# Patient Record
Sex: Female | Born: 1975 | Race: White | Hispanic: No | Marital: Married | Smoking: Former smoker
Health system: Southern US, Community
[De-identification: ages and names within clinical notes are randomized; demographics above are authoritative.]

## PROBLEM LIST (undated history)

## (undated) DIAGNOSIS — I10 Essential (primary) hypertension: Secondary | ICD-10-CM

## (undated) DIAGNOSIS — E119 Type 2 diabetes mellitus without complications: Secondary | ICD-10-CM

## (undated) DIAGNOSIS — Z8709 Personal history of other diseases of the respiratory system: Secondary | ICD-10-CM

## (undated) DIAGNOSIS — Z87442 Personal history of urinary calculi: Secondary | ICD-10-CM

## (undated) HISTORY — PX: CHOLECYSTECTOMY: SHX55

## (undated) HISTORY — PX: DILATION AND CURETTAGE OF UTERUS: SHX78

## (undated) HISTORY — PX: ANKLE FRACTURE SURGERY: SHX122

---

## 1997-11-30 ENCOUNTER — Other Ambulatory Visit: Admission: RE | Admit: 1997-11-30 | Discharge: 1997-11-30 | Payer: Self-pay | Admitting: Obstetrics and Gynecology

## 1997-12-26 ENCOUNTER — Emergency Department (HOSPITAL_COMMUNITY): Admission: EM | Admit: 1997-12-26 | Discharge: 1997-12-27 | Payer: Self-pay

## 1997-12-27 ENCOUNTER — Ambulatory Visit (HOSPITAL_COMMUNITY): Admission: RE | Admit: 1997-12-27 | Discharge: 1997-12-27 | Payer: Self-pay

## 1997-12-28 ENCOUNTER — Other Ambulatory Visit: Admission: RE | Admit: 1997-12-28 | Discharge: 1997-12-28 | Payer: Self-pay | Admitting: Obstetrics and Gynecology

## 1997-12-30 ENCOUNTER — Ambulatory Visit (HOSPITAL_COMMUNITY): Admission: RE | Admit: 1997-12-30 | Discharge: 1997-12-30 | Payer: Self-pay | Admitting: Obstetrics and Gynecology

## 1998-01-03 ENCOUNTER — Observation Stay (HOSPITAL_COMMUNITY): Admission: RE | Admit: 1998-01-03 | Discharge: 1998-01-04 | Payer: Self-pay | Admitting: Obstetrics and Gynecology

## 1998-02-07 ENCOUNTER — Other Ambulatory Visit: Admission: RE | Admit: 1998-02-07 | Discharge: 1998-02-07 | Payer: Self-pay | Admitting: Obstetrics and Gynecology

## 1998-03-30 ENCOUNTER — Ambulatory Visit (HOSPITAL_COMMUNITY): Admission: RE | Admit: 1998-03-30 | Discharge: 1998-03-30 | Payer: Self-pay | Admitting: Obstetrics and Gynecology

## 1998-06-05 ENCOUNTER — Emergency Department (HOSPITAL_COMMUNITY): Admission: EM | Admit: 1998-06-05 | Discharge: 1998-06-05 | Payer: Self-pay | Admitting: Emergency Medicine

## 1998-11-04 ENCOUNTER — Inpatient Hospital Stay (HOSPITAL_COMMUNITY): Admission: AD | Admit: 1998-11-04 | Discharge: 1998-11-04 | Payer: Self-pay | Admitting: Obstetrics and Gynecology

## 1998-12-02 ENCOUNTER — Inpatient Hospital Stay (HOSPITAL_COMMUNITY): Admission: AD | Admit: 1998-12-02 | Discharge: 1998-12-04 | Payer: Self-pay | Admitting: Obstetrics & Gynecology

## 1998-12-13 ENCOUNTER — Inpatient Hospital Stay (HOSPITAL_COMMUNITY): Admission: AD | Admit: 1998-12-13 | Discharge: 1998-12-13 | Payer: Self-pay | Admitting: Obstetrics and Gynecology

## 1998-12-14 ENCOUNTER — Inpatient Hospital Stay (HOSPITAL_COMMUNITY): Admission: AD | Admit: 1998-12-14 | Discharge: 1998-12-14 | Payer: Self-pay | Admitting: Obstetrics and Gynecology

## 1998-12-15 ENCOUNTER — Inpatient Hospital Stay (HOSPITAL_COMMUNITY): Admission: AD | Admit: 1998-12-15 | Discharge: 1998-12-15 | Payer: Self-pay | Admitting: Obstetrics and Gynecology

## 1998-12-16 ENCOUNTER — Observation Stay (HOSPITAL_COMMUNITY): Admission: AD | Admit: 1998-12-16 | Discharge: 1998-12-16 | Payer: Self-pay | Admitting: Obstetrics and Gynecology

## 1998-12-28 ENCOUNTER — Inpatient Hospital Stay (HOSPITAL_COMMUNITY): Admission: AD | Admit: 1998-12-28 | Discharge: 1998-12-31 | Payer: Self-pay | Admitting: Obstetrics and Gynecology

## 1999-01-08 ENCOUNTER — Emergency Department (HOSPITAL_COMMUNITY): Admission: EM | Admit: 1999-01-08 | Discharge: 1999-01-08 | Payer: Self-pay | Admitting: Emergency Medicine

## 1999-01-10 ENCOUNTER — Emergency Department (HOSPITAL_COMMUNITY): Admission: EM | Admit: 1999-01-10 | Discharge: 1999-01-10 | Payer: Self-pay | Admitting: Emergency Medicine

## 1999-01-18 ENCOUNTER — Encounter: Payer: Self-pay | Admitting: Emergency Medicine

## 1999-01-18 ENCOUNTER — Emergency Department (HOSPITAL_COMMUNITY): Admission: EM | Admit: 1999-01-18 | Discharge: 1999-01-18 | Payer: Self-pay | Admitting: Emergency Medicine

## 1999-01-29 ENCOUNTER — Other Ambulatory Visit: Admission: RE | Admit: 1999-01-29 | Discharge: 1999-01-29 | Payer: Self-pay | Admitting: Obstetrics and Gynecology

## 1999-02-04 ENCOUNTER — Encounter (INDEPENDENT_AMBULATORY_CARE_PROVIDER_SITE_OTHER): Payer: Self-pay | Admitting: Specialist

## 1999-02-04 ENCOUNTER — Inpatient Hospital Stay (HOSPITAL_COMMUNITY): Admission: EM | Admit: 1999-02-04 | Discharge: 1999-02-06 | Payer: Self-pay | Admitting: Emergency Medicine

## 1999-02-05 ENCOUNTER — Encounter: Payer: Self-pay | Admitting: Gastroenterology

## 1999-02-05 ENCOUNTER — Encounter (HOSPITAL_BASED_OUTPATIENT_CLINIC_OR_DEPARTMENT_OTHER): Payer: Self-pay | Admitting: General Surgery

## 2000-08-23 ENCOUNTER — Other Ambulatory Visit: Admission: RE | Admit: 2000-08-23 | Discharge: 2000-08-23 | Payer: Self-pay | Admitting: Gynecology

## 2001-12-19 ENCOUNTER — Emergency Department (HOSPITAL_COMMUNITY): Admission: EM | Admit: 2001-12-19 | Discharge: 2001-12-19 | Payer: Self-pay | Admitting: Emergency Medicine

## 2001-12-19 ENCOUNTER — Encounter: Payer: Self-pay | Admitting: Emergency Medicine

## 2002-11-03 ENCOUNTER — Encounter: Payer: Self-pay | Admitting: Emergency Medicine

## 2002-11-03 ENCOUNTER — Emergency Department (HOSPITAL_COMMUNITY): Admission: EM | Admit: 2002-11-03 | Discharge: 2002-11-03 | Payer: Self-pay | Admitting: Emergency Medicine

## 2004-07-27 ENCOUNTER — Inpatient Hospital Stay (HOSPITAL_COMMUNITY): Admission: EM | Admit: 2004-07-27 | Discharge: 2004-07-28 | Payer: Self-pay | Admitting: Emergency Medicine

## 2004-08-02 ENCOUNTER — Emergency Department (HOSPITAL_COMMUNITY): Admission: EM | Admit: 2004-08-02 | Discharge: 2004-08-02 | Payer: Self-pay | Admitting: Emergency Medicine

## 2004-10-01 ENCOUNTER — Ambulatory Visit (HOSPITAL_COMMUNITY): Admission: RE | Admit: 2004-10-01 | Discharge: 2004-10-01 | Payer: Self-pay | Admitting: Orthopedic Surgery

## 2005-09-05 ENCOUNTER — Emergency Department (HOSPITAL_COMMUNITY): Admission: EM | Admit: 2005-09-05 | Discharge: 2005-09-06 | Payer: Self-pay | Admitting: Emergency Medicine

## 2008-07-05 ENCOUNTER — Encounter: Admission: RE | Admit: 2008-07-05 | Discharge: 2008-07-05 | Payer: Self-pay | Admitting: Internal Medicine

## 2009-11-20 ENCOUNTER — Emergency Department (HOSPITAL_COMMUNITY): Admission: EM | Admit: 2009-11-20 | Discharge: 2009-11-20 | Payer: Self-pay | Admitting: Emergency Medicine

## 2010-09-15 LAB — URINALYSIS, ROUTINE W REFLEX MICROSCOPIC
Glucose, UA: NEGATIVE mg/dL
Ketones, ur: NEGATIVE mg/dL
Nitrite: NEGATIVE
Specific Gravity, Urine: 1.021 (ref 1.005–1.030)
pH: 6 (ref 5.0–8.0)

## 2010-09-15 LAB — BASIC METABOLIC PANEL
BUN: 13 mg/dL (ref 6–23)
Chloride: 110 mEq/L (ref 96–112)
GFR calc Af Amer: >60 mL/min (ref >60)
GFR calc non Af Amer: >60 mL/min (ref >60)
Potassium: 3.2 mEq/L — ABNORMAL LOW (ref 3.5–5.1)

## 2010-09-15 LAB — DIFFERENTIAL
Lymphocytes Relative: 35 % (ref 12–46)
Lymphs Abs: 3.5 10*3/uL (ref 0.7–4.0)
Monocytes Relative: 8 % (ref 3–12)
Neutrophils Relative %: 56 % (ref 43–77)

## 2010-09-15 LAB — CBC
HCT: 43.3 % (ref 36.0–46.0)
Platelets: 359 10*3/uL (ref 150–400)
RBC: 4.48 MIL/uL (ref 3.87–5.11)
WBC: 9.9 10*3/uL (ref 4.0–10.5)

## 2010-09-15 LAB — POCT PREGNANCY, URINE: Preg Test, Ur: NEGATIVE

## 2010-11-14 NOTE — H&P (Signed)
Vanessa Bean, DEFINO                ACCOUNT NO.:  [id number]   MEDICAL RECORD NO.:  [id number]          PATIENT TYPE:  OIB   LOCATION:  2899                         FACILITY:  [facility]   PHYSICIAN:  Myrtie Neither, MD      DATE OF BIRTH:  05-07-1976   DATE OF ADMISSION:  10/01/2004  DATE OF DISCHARGE:                                HISTORY & PHYSICAL   CHIEF COMPLAINT:  Painful right ankle.   HISTORY OF PRESENT ILLNESS:  This is a 35 year old female who sustained  trimalleolar fracture of her right ankle and underwent ORIF of the right  ankle approximately eight weeks ago.  The patient has medial malleolus screw  which is severely painful and restricting physical therapy and range of  motion.  X-rays reveal the fracture itself is healing quite well.   PAST MEDICAL HISTORY:  1.  ORIF of right ankle fracture.  2.  No history of high blood pressure or diabetes.   ALLERGIES:  None known.   MEDICATIONS:  1.  Prednisone.  2.  Amitriptyline.  3.  Percocet.  4.  Xanax 0.5 mg.   SOCIAL HISTORY:  Smokes one pack per day.  Denies use of alcohol.   PREVIOUS OPERATIONS:  1.  D&C x 2.  2.  C-section.  3.  Cholecystectomy.   REVIEW OF SYSTEMS:  Recent head and chest cold.  Has history of migraines.  No cardiac or urinary or bowel symptoms.   FAMILY HISTORY:  Noncontributory.   PHYSICAL EXAMINATION:  VITAL SIGNS:  Temperature 98; pulse 111; respirations  22; height 63 inches; weight 216.  HEENT:  Head normocephalic.  __________ were clear.  NECK:  Supple.  CHEST:  Clear.  CARDIAC:  S1, S2.  EXTREMITIES:  Right ankle with limited range of motion in plantar flexed  position.  Tender.  Palpable prominent screw head medially over the medial  malleolus.  No swelling.  Pulses intact.   IMPRESSION:  1.  Painful hardware right ankle.  2.  Trimalleolar fracture right ankle.   PLAN:  Removal of medial malleolar screw and application of short leg cast.      AC/MEDQ  D:  10/01/2004  T:   10/01/2004  Job:  [id number]

## 2010-11-14 NOTE — Op Note (Signed)
NAMEGENEAN, Vanessa Bean                ACCOUNT NO.:  [id number]   MEDICAL RECORD NO.:  [id number]          PATIENT TYPE:  EMS   LOCATION:  MAJO                         FACILITY:  [facility]   PHYSICIAN:  Myrtie Neither, MD      DATE OF BIRTH:  09-21-1975   DATE OF PROCEDURE:  09/24/2004  DATE OF DISCHARGE:  08/02/2004                                 OPERATIVE REPORT   PREOPERATIVE DIAGNOSIS:  Trimalleolar fracture, right ankle.   POSTOPERATIVE DIAGNOSIS:  Trimalleolar fracture, right ankle.   OPERATION PERFORMED:  Open reduction internal fixation, trimalleolar  fracture, right ankle.   SURGEON:  Myrtie Neither, MD   ANESTHESIA:  General.   DESCRIPTION OF PROCEDURE:  The patient was taken to the operating room after  given adequate preop medications, given general anesthesia, intubated.  Right lower leg was prepped and draped in sterile manner.  Tourniquet and  Bovie used for hemostasis.  C-arm used to visualize fracture reduction. A  lateral incision made over the lateral malleolus going through the skin and  subcutaneous tissue down to the fracture site.  The lateral malleolar  fracture which was comminuted was reduced and a 7-hole compression plate was  applied holding it in anatomic reduction and good stability.  Next, medial  incision was made over the medial malleolus going through the skin and  subcutaneous tissue down to the fracture site.  Hematoma was evacuated.  Soft tissue subperiosteally elevated away from the fracture site.  With the  use of towel clamp, the medial malleolar fracture was reduced and stabilized  with a malleolus screw.  The posterior fragment was anatomically reduced and  did not need any fixation.  Copious and abundant irrigation was done.  Small  cartilaginous fragments were evacuated from the joint.  Wound closure was  then done with 2-0 Vicryl for the subcutaneous laterally and medially and  skin staples for the skin.  Large bulky compressive dressing was  applied  followed by application of the Cam walker.  The patient tolerated the  procedure well and went to recovery room in stable and satisfactory  condition.      AC/MEDQ  D:  09/24/2004  T:  09/24/2004  Job:  [id number]

## 2010-11-14 NOTE — H&P (Signed)
Vanessa Bean, Vanessa Bean                ACCOUNT NO.:  [id number]   MEDICAL RECORD NO.:  [id number]          PATIENT TYPE:  INP   LOCATION:  1825                         FACILITY:  [facility]   PHYSICIAN:  Myrtie Neither, MD      DATE OF BIRTH:  1976/05/12   DATE OF ADMISSION:  07/26/2004  DATE OF DISCHARGE:                                HISTORY & PHYSICAL   CHIEF COMPLAINT:  Painful deformed right ankle.   HISTORY OF PRESENT ILLNESS:  This is a 35 year old female who had fallen  through the roof of a building at work and sustained an injury to her right  ankle. The patient complains of severe pain and deformity of her right  ankle. The patient was brought to [facility] emergency room for treatment.  The patient also complains of some numbness from the anterior thigh down to  the lower leg. The patient denies any low back pain or any other injuries.   PAST MEDICAL HISTORY:  No history of high blood pressure or diabetes.   ALLERGIES:  None known.   MEDICATIONS:  None.   FAMILY HISTORY:  Patient's parents had high blood pressure and diabetes.  Patient also has family history of cancer.   SOCIAL HISTORY:  Smokes occasionally but denies use of alcohol or any  illegal drugs.   REVIEW OF SYSTEMS:  The patient is basically in good health. No cardiac,  respiratory, no urinary or bowel symptoms.   PHYSICAL EXAMINATION:  VITAL SIGNS: Temperature 96, blood pressure 122/66,  pulse 114, respiratory rate 20.  GENERAL:  Alert and oriented, in no acute distress.  NECK:  Supple.  CHEST:  Clear.  CARDIAC:  S1, S2 regular.  LUMBAR:  Nontender to palpation. Negative sciatic notch test.  RIGHT LOWER EXTREMITY:  Obvious deformity at the ankle with dislocation,  markedly tender. Dorsalis pedis is palpable. Right foot is warm. The patient  has some hypoesthesia along the anterior right thigh and right lower leg.   X-ray revealed trimalleolar fracture dislocation, right ankle.   PREOPERATIVE DIAGNOSIS:   Trimalleolar fractured dislocation, right ankle.   PLAN:  Open reduction and internal fixation of right ankle.      AC/MEDQ  D:  07/27/2004  T:  07/27/2004  Job:  [id number]

## 2010-11-14 NOTE — Discharge Summary (Signed)
Vanessa Bean, Vanessa Bean                ACCOUNT NO.:  [id number]   MEDICAL RECORD NO.:  [id number]          PATIENT TYPE:  INP   LOCATION:                               FACILITY:  [facility]   PHYSICIAN:  Myrtie Neither, MD      DATE OF BIRTH:  05-05-1976   DATE OF ADMISSION:  07/27/2004  DATE OF DISCHARGE:  07/28/2004                                 DISCHARGE SUMMARY   ADMISSION DIAGNOSIS:  Trimalleolar fracture dislocation, right ankle.   DISCHARGE DIAGNOSIS:  Trimalleolar fracture dislocation, right ankle.   COMPLICATIONS:  None.   INFECTIONS:  None.   OPERATION:  Open reduction internal fixation, right trimalleolar fracture to  ankle.   PERTINENT HISTORY:  This is a 35 year old female who had fallen through a  roof at her work place and sustained injury to her right ankle and was  brought to [facility] Emergency Department for treatment.  The patient was  found to have trimalleolar fracture of the right ankle.  Pertinent physical  was done of the right ankle, positive deformity, swollen, pulses are intact.  X-rays reveal trimalleolar fracture of the right ankle.   The patient had preop laboratory done.  EKG, chest x-ray, CBC, CMET, which  were stable enough for the patient to undergo surgery.  The underwent  surgery on July 27, 2004.  Open reduction internal fixation.  Tolerated  procedure quite well.  Postop course fairly benign.  Ice packs.  Elevation.  Nonweightbearing with the use of walker.  Patient's pain was brought under  control and was able to be discharged on Percocet 1 to 2 by mouth every four  hours as needed for pain, Lodine 400 mg twice daily, Skelaxin 800 mg twice  daily.  Return to office in 10-day period.  Patient was discharged in stable  and satisfactory condition.      AC/MEDQ  D:  09/24/2004  T:  09/24/2004  Job:  [id number]

## 2010-11-14 NOTE — H&P (Signed)
NAMEZARRIA, TOWELL                ACCOUNT NO.:  [id number]   MEDICAL RECORD NO.:  [id number]          PATIENT TYPE:  INP   LOCATION:  1825                         FACILITY:  [facility]   PHYSICIAN:  Myrtie Neither, MD      DATE OF BIRTH:  1976-02-20   DATE OF ADMISSION:  07/26/2004  DATE OF DISCHARGE:                                HISTORY & PHYSICAL      AC/MEDQ  D:  07/27/2004  T:  07/27/2004  Job:  [id number]

## 2010-11-14 NOTE — Op Note (Signed)
[name], Vanessa Bean                ACCOUNT NO.:  [id number]   MEDICAL RECORD NO.:  [id number]          PATIENT TYPE:  OIB   LOCATION:  2899                         FACILITY:  [facility]   PHYSICIAN:  Myrtie Neither, MD      DATE OF BIRTH:  October 04, 1975   DATE OF PROCEDURE:  10/01/2004  DATE OF DISCHARGE:                                 OPERATIVE REPORT   PREOPERATIVE DIAGNOSIS:  Painful medial malleolar screw.   POSTOPERATIVE DIAGNOSIS:  Painful medial malleolar screw, with healing  trimalleolar fracture.   ANESTHESIA:  General.   PROCEDURE:  Removal of medial malleolar screw.   The patient was taken to the operating room.  After __________ intubated.  Right ankle was prepped with DuraPrep and draped in a sterile manner.  Tourniquet used for hemostasis.  Mini C-arm was used to visualize the  fracture site.  Medial incision was made over the prominent malleolar screw,  and the screw head was identified and removed.  With the use of mini C-arm,  visualization revealed that the fracture site was good and stable, healing  quite well, with no false movement.  Ankle itself was able to be brought  into a neutral position.  Irrigation was then done.  The wound was closed  with interrupted 4-0 nylon.  Compressive bulky dressing was applied, and  short leg cast was applied, with the foot in neutral position.  The patient  tolerated the procedure quite well and went to the recovery room in stable  and satisfactory condition.  The patient is discharged on Percocet 10/650  one q.6 h. p.r.n. for pain.  Ice packs, elevation, partial weightbearing on  the right side.  Fracture shoe.  To return to the office in one week.  The  patient is being discharged in stable and satisfactory condition.      AC/MEDQ  D:  10/01/2004  T:  10/01/2004  Job:  [id number]

## 2010-11-14 NOTE — Consult Note (Signed)
Vanessa Bean, Vanessa Bean                ACCOUNT NO.:  [id number]   MEDICAL RECORD NO.:  [id number]          PATIENT TYPE:  EMS   LOCATION:  MAJO                         FACILITY:  [facility]   PHYSICIAN:  Myrtie Neither, MD      DATE OF BIRTH:  07/17/1975   DATE OF CONSULTATION:  08/02/2004  DATE OF DISCHARGE:  08/02/2004                                   CONSULTATION   REASON FOR CONSULTATION:  Right hip and pelvic pain, numbness down right  lower extremity, tingling and numbness in right foot.   PERTINENT HISTORY:  This is a 35 year old female who had been treated for  trimalleolar fracture of the right ankle with ORIF approximately 4 days ago.  Patient sustained this injury from falling through a roof.  Patient  complained of right groin and pelvic pain which is progressively worsened.  The patient initially had this pain during her fall.  The patient has also  complained of persisting numbness and weakness of lifting her toes on the  right foot.  The patient was just previously seen two days ago and had a her  cast removed, dressing changed and wound assessed, was doing fine, and  placed in a CAM walker.   PERTINENT PHYSICAL:  Afebrile, pulse 82, respirations 16, blood pressure  120/70, 98.6 temperature.  RIGHT PELVIS:  Tender ischium and right groin area __and  ________ adductor muscles.  No discoloration.  Pain on abduction and flexion  and extension of the right hip.  Some mild tenderness about the sciatic  nerve posteriorly.  No tenderness about the SI joint.  RIGHT FOOT:  With a dressing.  Pulse intact.  Nailbed good and pink and  blanched quite well, foot is warm.  Sluggish toe extension of the toes but  the foot is in plantar flexed position.  Patient is unable to dorsiflex due  to operative site as well as pain.  Calf muscles nontender and soft as well  as along the peroneal compartment.  No evidence of compartment syndrome  present.   X-ray of the pelvis reveals some mild  separation of the symphysis pubis.  Bilateral hips are normal.   IMPRESSION:  1.  Mild separation of symphysis pubis.  2.  Fractured right ankle, trimalleolar.   Treatment is that of would use Decadron 4 mg b.i.d. x3 days, then followed  by resumption of Lodine 400 b.i.d.  Continue Skelaxin 800 mg b.i.d.,  Percocet 1-2 q.4 p.r.n. for pain and to keep previous office appointment,  which is within 10 day period.  Continue ice packs, elevation both to the  pelvis as well as the right lower extremity.  The patient is being  discharged in satisfactory condition.      AC/MEDQ  D:  08/02/2004  T:  08/03/2004  Job:  [id number]

## 2011-08-24 ENCOUNTER — Other Ambulatory Visit: Payer: Self-pay | Admitting: Family Medicine

## 2011-08-24 DIAGNOSIS — M25562 Pain in left knee: Secondary | ICD-10-CM

## 2011-08-27 ENCOUNTER — Inpatient Hospital Stay: Admission: RE | Admit: 2011-08-27 | Payer: Self-pay | Source: Ambulatory Visit

## 2011-08-28 ENCOUNTER — Ambulatory Visit
Admission: RE | Admit: 2011-08-28 | Discharge: 2011-08-28 | Disposition: A | Discharge status: Home / Self Care | Payer: BC Managed Care – PPO | Source: Ambulatory Visit | Attending: Family Medicine | Admitting: Family Medicine

## 2011-08-28 DIAGNOSIS — M25562 Pain in left knee: Secondary | ICD-10-CM

## 2013-08-16 ENCOUNTER — Emergency Department (HOSPITAL_COMMUNITY)
Admission: EM | Admit: 2013-08-16 | Discharge: 2013-08-17 | Disposition: A | Discharge status: Home / Self Care | Payer: Self-pay | Attending: Emergency Medicine | Admitting: Emergency Medicine

## 2013-08-16 ENCOUNTER — Other Ambulatory Visit: Payer: Self-pay

## 2013-08-16 ENCOUNTER — Encounter (HOSPITAL_COMMUNITY): Payer: Self-pay | Admitting: Emergency Medicine

## 2013-08-16 ENCOUNTER — Emergency Department (HOSPITAL_COMMUNITY): Payer: Self-pay

## 2013-08-16 DIAGNOSIS — R42 Dizziness and giddiness: Secondary | ICD-10-CM | POA: Insufficient documentation

## 2013-08-16 DIAGNOSIS — IMO0002 Reserved for concepts with insufficient information to code with codable children: Secondary | ICD-10-CM | POA: Insufficient documentation

## 2013-08-16 DIAGNOSIS — R110 Nausea: Secondary | ICD-10-CM | POA: Insufficient documentation

## 2013-08-16 DIAGNOSIS — J209 Acute bronchitis, unspecified: Secondary | ICD-10-CM | POA: Insufficient documentation

## 2013-08-16 DIAGNOSIS — J40 Bronchitis, not specified as acute or chronic: Secondary | ICD-10-CM

## 2013-08-16 DIAGNOSIS — F17200 Nicotine dependence, unspecified, uncomplicated: Secondary | ICD-10-CM | POA: Insufficient documentation

## 2013-08-16 LAB — BASIC METABOLIC PANEL
BUN: 16 mg/dL (ref 6–23)
CALCIUM: 9.8 mg/dL (ref 8.4–10.5)
CO2: 19 mEq/L (ref 19–32)
CREATININE: 0.84 mg/dL (ref 0.50–1.10)
Chloride: 97 mEq/L (ref 96–112)
GFR, EST NON AFRICAN AMERICAN: 89 mL/min — AB (ref >90)
Glucose, Bld: 144 mg/dL — ABNORMAL HIGH (ref 70–99)
Potassium: 5.1 mEq/L (ref 3.7–5.3)
SODIUM: 135 meq/L — AB (ref 137–147)

## 2013-08-16 LAB — CBC
HCT: 44.6 % (ref 36.0–46.0)
Hemoglobin: 16 g/dL — ABNORMAL HIGH (ref 12.0–15.0)
MCH: 32.5 pg (ref 26.0–34.0)
MCHC: 35.9 g/dL (ref 30.0–36.0)
MCV: 90.5 fL (ref 78.0–100.0)
PLATELETS: UNDETERMINED 10*3/uL (ref 150–400)
RBC: 4.93 MIL/uL (ref 3.87–5.11)
RDW: 12.9 % (ref 11.5–15.5)
WBC: 8 10*3/uL (ref 4.0–10.5)

## 2013-08-16 LAB — PRO B NATRIURETIC PEPTIDE: PRO B NATRI PEPTIDE: 28.4 pg/mL (ref 0–125)

## 2013-08-16 LAB — POCT I-STAT TROPONIN I: Troponin i, poc: 0 ng/mL (ref 0.00–0.08)

## 2013-08-16 LAB — CG4 I-STAT (LACTIC ACID): LACTIC ACID, VENOUS: 3.11 mmol/L — AB (ref 0.5–2.2)

## 2013-08-16 MED ORDER — SODIUM CHLORIDE 0.9 % IV BOLUS (SEPSIS)
1000.0000 mL | Freq: Once | INTRAVENOUS | Status: AC
  Administered 2013-08-16: 1000 mL via INTRAVENOUS

## 2013-08-16 NOTE — ED Provider Notes (Signed)
CSN: [id number]     [id number] date & time 08/16/13  2026 History   First MD Initiated Contact with Patient 08/16/13 2135     Chief Complaint  Patient presents with  . Chest Pain     (Consider location/radiation/quality/duration/timing/severity/associated sxs/prior Treatment) Patient is a 38 y.o. female presenting with general illness.  Illness Quality:  Cough, sinusitis, lightheadedness Severity:  Moderate Onset quality:  Gradual Duration: more than a week. Timing:  Constant Progression since onset: initially improved, then worsened again yesterday. Chronicity:  New Context:  Evaluated at an Urgent care 3 days ago and started on amoxicillin and prednisone for "bronchitis" Relieved by:  Rest Worsened by:  Activity Associated symptoms: chest pain, congestion, nausea and shortness of breath   Associated symptoms: no fever, no loss of consciousness and no vomiting   Associated symptoms comment:  Lightheadedness   History reviewed. No pertinent past medical history. History reviewed. No pertinent past surgical history. History reviewed. No pertinent family history. History  Substance Use Topics  . Smoking status: Current Every Day Smoker  . Smokeless tobacco: Never Used  . Alcohol Use: No   OB History   Grav Para Term Preterm Abortions TAB SAB Ect Mult Living                 Review of Systems  Constitutional: Negative for fever.  HENT: Positive for congestion.   Respiratory: Positive for shortness of breath.   Cardiovascular: Positive for chest pain.  Gastrointestinal: Positive for nausea. Negative for vomiting.  Neurological: Negative for loss of consciousness.  All other systems reviewed and are negative.      Allergies  Percocet  Home Medications   Current Outpatient Rx  Name  Route  Sig  Dispense  Refill  . amoxicillin-clavulanate (AUGMENTIN) 875-125 MG per tablet   Oral   Take 1 tablet by mouth 2 (two) times daily.         Marland Kitchen.  losartan-hydrochlorothiazide (HYZAAR) 100-12.5 MG per tablet   Oral   Take 1 tablet by mouth daily.         . predniSONE (DELTASONE) 20 MG tablet   Oral   Take 60 mg by mouth daily with breakfast.          BP 136/80  Pulse 80  Temp(Src) 97.8 F (36.6 C) (Oral)  Resp 20  SpO2 98%  LMP 07/24/2013 Physical Exam  Nursing note and vitals reviewed. Constitutional: She is oriented to person, place, and time. She appears well-developed and well-nourished. No distress.  HENT:  Head: Normocephalic and atraumatic.  Mouth/Throat: Oropharynx is clear and moist.  Eyes: Conjunctivae are normal. Pupils are equal, round, and reactive to light. No scleral icterus.  Neck: Neck supple.  Cardiovascular: Normal rate, regular rhythm, normal heart sounds and intact distal pulses.   No murmur heard. Pulmonary/Chest: Effort normal and breath sounds normal. No stridor. No respiratory distress. She has no wheezes. She has no rales. She exhibits no tenderness.  Abdominal: Soft. Bowel sounds are normal. She exhibits no distension. There is no tenderness. There is no rebound and no guarding.  Musculoskeletal: Normal range of motion.  Neurological: She is alert and oriented to person, place, and time.  Skin: Skin is warm and dry. No rash noted.  Psychiatric: She has a normal mood and affect. Her behavior is normal.    ED Course  Procedures (including critical care time) Labs Review Labs Reviewed  CBC - Abnormal; Notable for the following:    Hemoglobin 16.0 (*)  All other components within normal limits  BASIC METABOLIC PANEL - Abnormal; Notable for the following:    Sodium 135 (*)    Glucose, Bld 144 (*)    GFR calc non Af Amer 89 (*)    All other components within normal limits  CG4 I-STAT (LACTIC ACID) - Abnormal; Notable for the following:    Lactic Acid, Venous 3.11 (*)    All other components within normal limits  PRO B NATRIURETIC PEPTIDE  POCT I-STAT TROPONIN I   Imaging Review Dg  Chest 2 View  08/16/2013   CLINICAL DATA:  Left chest pain  EXAM: CHEST - 2 VIEW  COMPARISON:  09/30/2004  FINDINGS: Lungs are clear. Heart size upper limits normal. The mediastinal contours are within normal limits. No effusion. Visualized skeletal structures are unremarkable. Surgical clips right upper abdomen.  IMPRESSION: No acute cardiopulmonary disease.   Electronically Signed   By: Oley Balm M.D.   On: 08/16/2013 21:16   Ct Angio Chest Pe W/cm &/or Wo Cm  08/17/2013   CLINICAL DATA:  Bronchitis diagnosis, with consistent left chest pain radiating to back with shortness of breath. Worse with exertion.  EXAM: CT ANGIOGRAPHY CHEST WITH CONTRAST  TECHNIQUE: Multidetector CT imaging of the chest was performed using the standard protocol during bolus administration of intravenous contrast. Multiplanar CT image reconstructions and MIPs were obtained to evaluate the vascular anatomy.  CONTRAST:  OMNIPAQUE IOHEXOL 350 MG/ML SOLN  COMPARISON:  Chest radiograph August 16, 2013  FINDINGS: Main pulmonary artery is not enlarged. No pulmonary arterial filling defects to the level of the sub segmental branches.  The heart and pericardium are unremarkable. Thoracic aorta is normal in course and caliber, normal.  Tracheobronchial tree is patent and midline with minimal bronchial wall thickening. No pleural effusions or focal consolidations. No pneumothorax.  No lymphadenopathy by CT size criteria. Thoracic esophagus is unremarkable. Included view of the abdomen is normal. Osseous structures are nonsuspicious. Mild degenerative change of thoracic spine. Soft tissues are nonsuspicious, large body habitus.  Review of the MIP images confirms the above findings.  IMPRESSION: No pulmonary embolism.  Trace bronchial wall thickening may reflect bronchitis.   Electronically Signed   By: Awilda Metro   On: 08/17/2013 01:03  All radiology studies independently viewed by me.     EKG Interpretation   None       EKG - NSR, normal axis, normal intervals, no ST/T changes, no brugada, no delta wave, normal QTc, no priors.  MDM   Final diagnoses:  Bronchitis    38 yo female presenting with URI symptoms, cough, and several episodes of lightheadedness, chest pain, and shortness of breath.  She reports having URI symptoms for over a week.  She went to an Urgent care and was prescribed amoxicillin and prednisone, which she started taking two days ago.  Yesterday, she had a few episodes during which she would feel lightheaded, associated with nausea and diaphoresis, which would require her to lay down and rest.  These seemed to occurred when she would get up and do housework.  She has also had some chest pain (left sided) and shortness of breath.  Her pain is worse with deep inspiration.  Her ED workup was unrevealing, with an unremarkable EKG, normal wbc count, normal renal function, and a negative CXR and CTA PE study.  Orthostatics also negative.  She ambulated without becoming hypoxic.  Her initial poc lactate was slightly elevated.  Standard lactate after 1L NS was  normal.    Her symptoms might be related to bronchitis or to the medications she started on Monday.  I think the prednisone is potentially doing more harm than good, so will discontinue this.  Will also switch her to doxycycline, which she reports has worked well in the past.  Return precautions given.     Candyce Churn III, MD 08/17/13 1250

## 2013-08-16 NOTE — ED Notes (Signed)
Dr.Wofford aware of CG4 3.11 mmol/L

## 2013-08-16 NOTE — ED Notes (Signed)
Pt diagnosed and is being treated for bronchitis states left sided constant CP that radiates to her back since yesterday with SOB, Nausea, and diarrhea. Pain relived by nothing, and exacerbated with exertion.

## 2013-08-16 NOTE — ED Notes (Signed)
Dr. Wofford at bedside 

## 2013-08-16 NOTE — ED Notes (Signed)
The tech ambulated the patient in the hallway, approximately 10 feet. The patient complained of dizzy, light-headed, and nauseated and chest pain. The patients Oximetry pule was 99% while walking; but, coming back to the room patient with shortness of breath. The tech reported to the RN in charge.

## 2013-08-17 ENCOUNTER — Emergency Department (HOSPITAL_COMMUNITY): Payer: BC Managed Care – PPO

## 2013-08-17 ENCOUNTER — Encounter (HOSPITAL_COMMUNITY): Payer: Self-pay | Admitting: Radiology

## 2013-08-17 LAB — LACTIC ACID, PLASMA: Lactic Acid, Venous: 1.7 mmol/L (ref 0.5–2.2)

## 2013-08-17 MED ORDER — ALBUTEROL SULFATE HFA 108 (90 BASE) MCG/ACT IN AERS
2.0000 | INHALATION_SPRAY | Freq: Once | RESPIRATORY_TRACT | Status: AC
  Administered 2013-08-17: 2 via RESPIRATORY_TRACT
  Filled 2013-08-17: qty 6.7

## 2013-08-17 MED ORDER — IOHEXOL 350 MG/ML SOLN
100.0000 mL | Freq: Once | INTRAVENOUS | Status: AC | PRN
  Administered 2013-08-17: 100 mL via INTRAVENOUS

## 2013-08-17 MED ORDER — DOXYCYCLINE HYCLATE 100 MG PO CAPS: 100.0000 mg | ORAL_CAPSULE | Freq: Two times a day (BID) | ORAL | Status: DC

## 2013-08-17 NOTE — ED Notes (Signed)
Adjusted pt's blood pressure cuff

## 2013-08-17 NOTE — Discharge Instructions (Signed)

## 2013-08-17 NOTE — ED Notes (Signed)
Pt returned from CT °

## 2013-08-29 ENCOUNTER — Emergency Department (INDEPENDENT_AMBULATORY_CARE_PROVIDER_SITE_OTHER)
Admission: EM | Admit: 2013-08-29 | Discharge: 2013-08-29 | Disposition: A | Discharge status: Home / Self Care | Payer: Self-pay | Source: Home / Self Care | Attending: Family Medicine | Admitting: Family Medicine

## 2013-08-29 ENCOUNTER — Encounter (HOSPITAL_COMMUNITY): Payer: Self-pay | Admitting: Emergency Medicine

## 2013-08-29 DIAGNOSIS — IMO0001 Reserved for inherently not codable concepts without codable children: Secondary | ICD-10-CM

## 2013-08-29 DIAGNOSIS — R05 Cough: Secondary | ICD-10-CM

## 2013-08-29 DIAGNOSIS — R030 Elevated blood-pressure reading, without diagnosis of hypertension: Secondary | ICD-10-CM

## 2013-08-29 DIAGNOSIS — J3489 Other specified disorders of nose and nasal sinuses: Secondary | ICD-10-CM

## 2013-08-29 DIAGNOSIS — R0981 Nasal congestion: Secondary | ICD-10-CM

## 2013-08-29 DIAGNOSIS — R059 Cough, unspecified: Secondary | ICD-10-CM

## 2013-08-29 HISTORY — DX: Essential (primary) hypertension: I10

## 2013-08-29 MED ORDER — IPRATROPIUM BROMIDE 0.06 % NA SOLN: 2.0000 | Freq: Four times a day (QID) | NASAL | Status: DC

## 2013-08-29 MED ORDER — BENZONATATE 100 MG PO CAPS: 100.0000 mg | ORAL_CAPSULE | Freq: Three times a day (TID) | ORAL | Status: DC | PRN

## 2013-08-29 MED ORDER — AMLODIPINE BESYLATE 5 MG PO TABS: 5.0000 mg | ORAL_TABLET | Freq: Every day | ORAL | Status: DC

## 2013-08-29 MED ORDER — HYDROCHLOROTHIAZIDE 12.5 MG PO TABS: 12.5000 mg | ORAL_TABLET | Freq: Every day | ORAL | Status: DC

## 2013-08-29 NOTE — ED Notes (Signed)
Patient reports being sick since 2/8.  Initially seen at [facility].  Treated for sinus infection and bronchitis with prednisone and amoxicillin.  Reports she had episode of feeling like heart coming out of chest and went emergently to [facility].  Ed stopped amoxicillin and prednisone and started her on ?doxycycline.  Finished last antibiotic on 2/26 or 2/27.  Since then continues to feel bad.  Sob with activity, hoarseness, head, neck, bilateral ear pressure.  Coughing, coughing at night and feeling terrible

## 2013-08-29 NOTE — Discharge Instructions (Signed)
Arterial Hypertension °Arterial hypertension (high blood pressure) is a condition of elevated pressure in your blood vessels. Hypertension over a long period of time is a risk factor for strokes, heart attacks, and heart failure. It is also the leading cause of kidney (renal) failure.  °CAUSES  °· In Adults -- Over 90% of all hypertension has no known cause. This is called essential or primary hypertension. In the other 10% of people with hypertension, the increase in blood pressure is caused by another disorder. This is called secondary hypertension. Important causes of secondary hypertension are: °· Heavy alcohol use. °· Obstructive sleep apnea. °· Hyperaldosterosim (Conn's syndrome). °· Steroid use. °· Chronic kidney failure. °· Hyperparathyroidism. °· Medications. °· Renal artery stenosis. °· Pheochromocytoma. °· Cushing's disease. °· Coarctation of the aorta. °· Scleroderma renal crisis. °· Licorice (in excessive amounts). °· Drugs (cocaine, methamphetamine). °Your caregiver can explain any items above that apply to you. °· In Children -- Secondary hypertension is more common and should always be considered. °· Pregnancy -- Few women of childbearing age have high blood pressure. However, up to 10% of them develop hypertension of pregnancy. Generally, this will not harm the woman. It may be a sign of 3 complications of pregnancy: preeclampsia, HELLP syndrome, and eclampsia. Follow up and control with medication is necessary. °SYMPTOMS  °· This condition normally does not produce any noticeable symptoms. It is usually found during a routine exam. °· Malignant hypertension is a late problem of high blood pressure. It may have the following symptoms: °· Headaches. °· Blurred vision. °· End-organ damage (this means your kidneys, heart, lungs, and other organs are being damaged). °· Stressful situations can increase the blood pressure. If a person with normal blood pressure has their blood pressure go up while being  seen by their caregiver, this is often termed "white coat hypertension." Its importance is not known. It may be related with eventually developing hypertension or complications of hypertension. °· Hypertension is often confused with mental tension, stress, and anxiety. °DIAGNOSIS  °The diagnosis is made by 3 separate blood pressure measurements. They are taken at least 1 week apart from each other. If there is organ damage from hypertension, the diagnosis may be made without repeat measurements. °Hypertension is usually identified by having blood pressure readings: °· Above 140/90 mmHg measured in both arms, at 3 separate times, over a couple weeks. °· Over 130/80 mmHg should be considered a risk factor and may require treatment in patients with diabetes. °Blood pressure readings over 120/80 mmHg are called "pre-hypertension" even in non-diabetic patients. °To get a true blood pressure measurement, use the following guidelines. Be aware of the factors that can alter blood pressure readings. °· Take measurements at least 1 hour after caffeine. °· Take measurements 30 minutes after smoking and without any stress. This is another reason to quit smoking  it raises your blood pressure. °· Use a proper cuff size. Ask your caregiver if you are not sure about your cuff size. °· Most home blood pressure cuffs are automatic. They will measure systolic and diastolic pressures. The systolic pressure is the pressure reading at the start of sounds. Diastolic pressure is the pressure at which the sounds disappear. If you are elderly, measure pressures in multiple postures. Try sitting, lying or standing. °· Sit at rest for a minimum of 5 minutes before taking measurements. °· You should not be on any medications like decongestants. These are found in many cold medications. °· Record your blood pressure readings and review   them with your caregiver. °If you have hypertension: °· Your caregiver may do tests to be sure you do not have  secondary hypertension (see "causes" above). °· Your caregiver may also look for signs of metabolic syndrome. This is also called Syndrome X or Insulin Resistance Syndrome. You may have this syndrome if you have type 2 diabetes, abdominal obesity, and abnormal blood lipids in addition to hypertension. °· Your caregiver will take your medical and family history and perform a physical exam. °· Diagnostic tests may include blood tests (for glucose, cholesterol, potassium, and kidney function), a urinalysis, or an EKG. Other tests may also be necessary depending on your condition. °PREVENTION  °There are important lifestyle issues that you can adopt to reduce your chance of developing hypertension: °· Maintain a normal weight. °· Limit the amount of salt (sodium) in your diet. °· Exercise often. °· Limit alcohol intake. °· Get enough potassium in your diet. Discuss specific advice with your caregiver. °· Follow a DASH diet (dietary approaches to stop hypertension). This diet is rich in fruits, vegetables, and low-fat dairy products, and avoids certain fats. °PROGNOSIS  °Essential hypertension cannot be cured. Lifestyle changes and medical treatment can lower blood pressure and reduce complications. The prognosis of secondary hypertension depends on the underlying cause. Many people whose hypertension is controlled with medicine or lifestyle changes can live a normal, healthy life.  °RISKS AND COMPLICATIONS  °While high blood pressure alone is not an illness, it often requires treatment due to its short- and long-term effects on many organs. Hypertension increases your risk for: °· CVAs or strokes (cerebrovascular accident). °· Heart failure due to chronically high blood pressure (hypertensive cardiomyopathy). °· Heart attack (myocardial infarction). °· Damage to the retina (hypertensive retinopathy). °· Kidney failure (hypertensive nephropathy). °Your caregiver can explain list items above that apply to you. Treatment  of hypertension can significantly reduce the risk of complications. °TREATMENT  °· For overweight patients, weight loss and regular exercise are recommended. Physical fitness lowers blood pressure. °· Mild hypertension is usually treated with diet and exercise. A diet rich in fruits and vegetables, fat-free dairy products, and foods low in fat and salt (sodium) can help lower blood pressure. Decreasing salt intake decreases blood pressure in a 1/3 of people. °· Stop smoking if you are a smoker. °The steps above are highly effective in reducing blood pressure. While these actions are easy to suggest, they are difficult to achieve. Most patients with moderate or severe hypertension end up requiring medications to bring their blood pressure down to a normal level. There are several classes of medications for treatment. Blood pressure pills (antihypertensives) will lower blood pressure by their different actions. Lowering the blood pressure by 10 mmHg may decrease the risk of complications by as much as 25%. °The goal of treatment is effective blood pressure control. This will reduce your risk for complications. Your caregiver will help you determine the best treatment for you according to your lifestyle. What is excellent treatment for one person, may not be for you. °HOME CARE INSTRUCTIONS  °· Do not smoke. °· Follow the lifestyle changes outlined in the "Prevention" section. °· If you are on medications, follow the directions carefully. Blood pressure medications must be taken as prescribed. Skipping doses reduces their benefit. It also puts you at risk for problems. °· Follow up with your caregiver, as directed. °· If you are asked to monitor your blood pressure at home, follow the guidelines in the "Diagnosis" section above. °SEEK MEDICAL CARE   IF:  °· You think you are having medication side effects. °· You have recurrent headaches or lightheadedness. °· You have swelling in your ankles. °· You have trouble with  your vision. °SEEK IMMEDIATE MEDICAL CARE IF:  °· You have sudden onset of chest pain or pressure, difficulty breathing, or other symptoms of a heart attack. °· You have a severe headache. °· You have symptoms of a stroke (such as sudden weakness, difficulty speaking, difficulty walking). °MAKE SURE YOU:  °· Understand these instructions. °· Will watch your condition. °· Will get help right away if you are not doing well or get worse. °Document Released: 06/15/2005 Document Revised: 09/07/2011 Document Reviewed: 01/13/2007 °[employer]® Patient Information ©2014 [employer]. ° °

## 2013-08-29 NOTE — ED Provider Notes (Signed)
CSN: [id number]     Arrival date & time 08/29/13  [id number] History   First MD Initiated Contact with Patient 08/29/13 [phone]     Chief Complaint  Patient presents with  . URI   (Consider location/radiation/quality/duration/timing/severity/associated sxs/prior Treatment) HPI Comments: Has already finished course of augmentin and doxycycline. Using albuterol inhaler with some relief. Report she recently quit smoking on 08/06/2013. No fever, night sweats, weight loss or hemoptysis. States she has tried OTC decongestants for symptoms, but these raise her blood pressure.  Reports she has also run out of her BP medication.  Patient is a 38 y.o. female presenting with URI. The history is provided by the patient.  URI Presenting symptoms: congestion, cough and rhinorrhea   Severity:  Moderate Onset quality:  Gradual Duration:  1 month Timing:  Intermittent Progression:  Unchanged Chronicity:  Chronic   Past Medical History  Diagnosis Date  . Hypertension    History reviewed. No pertinent past surgical history. No family history on file. History  Substance Use Topics  . Smoking status: Current Every Day Smoker  . Smokeless tobacco: Never Used  . Alcohol Use: No   OB History   Grav Para Term Preterm Abortions TAB SAB Ect Mult Living                 Review of Systems  Constitutional: Negative.   HENT: Positive for congestion, postnasal drip, rhinorrhea, sinus pressure and voice change.   Eyes: Negative.   Respiratory: Positive for cough.   Cardiovascular: Negative.   Gastrointestinal: Negative.   Endocrine: Negative for polydipsia, polyphagia and polyuria.  Genitourinary: Negative.   Musculoskeletal: Negative.   Skin: Negative.   Allergic/Immunologic: Negative for immunocompromised state.  Neurological: Negative.   Hematological: Negative for adenopathy.    Allergies  Percocet  Home Medications   Current Outpatient Rx  Name  Route  Sig  Dispense  Refill  .  amoxicillin-clavulanate (AUGMENTIN) 875-125 MG per tablet   Oral   Take 1 tablet by mouth 2 (two) times daily.         Marland Kitchen. doxycycline (VIBRAMYCIN) 100 MG capsule   Oral   Take 1 capsule (100 mg total) by mouth 2 (two) times daily.   14 capsule   0   . amLODipine (NORVASC) 5 MG tablet   Oral   Take 1 tablet (5 mg total) by mouth daily.   30 tablet   1   . benzonatate (TESSALON) 100 MG capsule   Oral   Take 1 capsule (100 mg total) by mouth 3 (three) times daily as needed for cough.   21 capsule   0   . hydrochlorothiazide (HYDRODIURIL) 12.5 MG tablet   Oral   Take 1 tablet (12.5 mg total) by mouth daily.   30 tablet   1   . ipratropium (ATROVENT) 0.06 % nasal spray   Each Nare   Place 2 sprays into both nostrils 4 (four) times daily.   15 mL   1   . losartan-hydrochlorothiazide (HYZAAR) 100-12.5 MG per tablet   Oral   Take 1 tablet by mouth daily. Patient reports she is taking intermittently -no job, no insurance, unable to afford medicine          BP 141/94  Pulse 91  Temp(Src) 98.2 F (36.8 C) (Oral)  Resp 22  SpO2 97%  LMP 08/24/2013 Physical Exam  Nursing note and vitals reviewed. Constitutional: She is oriented to person, place, and time. She appears well-developed and well-nourished. No  distress.  HENT:  Head: Normocephalic and atraumatic.  Right Ear: Hearing, tympanic membrane, external ear and ear canal normal.  Left Ear: Hearing, tympanic membrane, external ear and ear canal normal.  Nose: Nose normal.  Mouth/Throat: Uvula is midline, oropharynx is clear and moist and mucous membranes are normal.  Neck: Neck supple.  Cardiovascular: Normal rate, regular rhythm and normal heart sounds.   Pulmonary/Chest: Effort normal and breath sounds normal. No respiratory distress. She has no wheezes. She has no rales.  Abdominal: Soft. Bowel sounds are normal. She exhibits no distension. There is no tenderness.  Musculoskeletal: Normal range of motion. She  exhibits no edema and no tenderness.  Lymphadenopathy:    She has no cervical adenopathy.  Neurological: She is alert and oriented to person, place, and time.  Skin: Skin is warm and dry. No rash noted.  Psychiatric: She has a normal mood and affect. Her behavior is normal.    ED Course  Procedures (including critical care time) Labs Review Labs Reviewed - No data to display Imaging Review No results found.   MDM   1. Cough   2. Elevated blood pressure   3. Nasal congestion   Patient cannot afford her current BP medication (combination pill & brand only). Will try to provide generic equivalents for her. Encouraged to continue to stay away from cigarettes. No clinical indication of continued infection. Cough appears to be precipitated by post nasal drainage. Will Rx atrovent nasal spray, tessalon for drainage and cough. Also advised patient she could safely add OTC antihitamine such as zyrtec or claritin for additional relief. Patient given resources for PCP in area.    Jess Barters [place], [place] 08/29/13 1025

## 2013-08-30 NOTE — ED Provider Notes (Signed)
Medical screening examination/treatment/procedure(s) were performed by resident physician or non-physician practitioner and as supervising physician I was immediately available for consultation/collaboration.   KINDL,JAMES DOUGLAS MD.   James D Kindl, MD 08/30/13 1138 

## 2017-10-05 ENCOUNTER — Other Ambulatory Visit: Payer: Self-pay | Admitting: Family Medicine

## 2017-10-05 ENCOUNTER — Ambulatory Visit: Payer: Self-pay

## 2017-10-05 DIAGNOSIS — M79671 Pain in right foot: Secondary | ICD-10-CM

## 2019-01-03 ENCOUNTER — Other Ambulatory Visit: Payer: Self-pay

## 2019-01-03 ENCOUNTER — Ambulatory Visit: Payer: Self-pay

## 2019-01-03 ENCOUNTER — Other Ambulatory Visit: Payer: Self-pay | Admitting: Family Medicine

## 2019-01-03 DIAGNOSIS — M25571 Pain in right ankle and joints of right foot: Secondary | ICD-10-CM

## 2019-01-03 DIAGNOSIS — M25561 Pain in right knee: Secondary | ICD-10-CM

## 2019-11-07 ENCOUNTER — Emergency Department (HOSPITAL_COMMUNITY): Payer: Self-pay | Admitting: Certified Registered Nurse Anesthetist

## 2019-11-07 ENCOUNTER — Emergency Department (HOSPITAL_COMMUNITY): Payer: Self-pay

## 2019-11-07 ENCOUNTER — Encounter (HOSPITAL_COMMUNITY)
Admission: EM | Disposition: A | Discharge status: Home / Self Care | Payer: Self-pay | Source: Home / Self Care | Attending: Urology

## 2019-11-07 ENCOUNTER — Inpatient Hospital Stay (HOSPITAL_COMMUNITY)
Admission: EM | Admit: 2019-11-07 | Discharge: 2019-11-09 | DRG: 854 | Disposition: A | Discharge status: Home / Self Care | Payer: Self-pay | Attending: Urology | Admitting: Urology

## 2019-11-07 ENCOUNTER — Encounter (HOSPITAL_COMMUNITY): Payer: Self-pay | Admitting: *Deleted

## 2019-11-07 ENCOUNTER — Other Ambulatory Visit: Payer: Self-pay

## 2019-11-07 DIAGNOSIS — A419 Sepsis, unspecified organism: Principal | ICD-10-CM | POA: Diagnosis present

## 2019-11-07 DIAGNOSIS — N200 Calculus of kidney: Secondary | ICD-10-CM

## 2019-11-07 DIAGNOSIS — N202 Calculus of kidney with calculus of ureter: Secondary | ICD-10-CM | POA: Diagnosis present

## 2019-11-07 DIAGNOSIS — Z885 Allergy status to narcotic agent status: Secondary | ICD-10-CM

## 2019-11-07 DIAGNOSIS — Z87442 Personal history of urinary calculi: Secondary | ICD-10-CM

## 2019-11-07 DIAGNOSIS — Z20822 Contact with and (suspected) exposure to covid-19: Secondary | ICD-10-CM | POA: Diagnosis present

## 2019-11-07 DIAGNOSIS — F17200 Nicotine dependence, unspecified, uncomplicated: Secondary | ICD-10-CM | POA: Diagnosis present

## 2019-11-07 DIAGNOSIS — I10 Essential (primary) hypertension: Secondary | ICD-10-CM | POA: Diagnosis present

## 2019-11-07 DIAGNOSIS — N136 Pyonephrosis: Secondary | ICD-10-CM | POA: Diagnosis present

## 2019-11-07 DIAGNOSIS — Z6841 Body Mass Index (BMI) 40.0 and over, adult: Secondary | ICD-10-CM

## 2019-11-07 DIAGNOSIS — Z79899 Other long term (current) drug therapy: Secondary | ICD-10-CM

## 2019-11-07 DIAGNOSIS — N390 Urinary tract infection, site not specified: Secondary | ICD-10-CM | POA: Diagnosis present

## 2019-11-07 DIAGNOSIS — N201 Calculus of ureter: Secondary | ICD-10-CM

## 2019-11-07 HISTORY — PX: CYSTOSCOPY W/ RETROGRADES: SHX1426

## 2019-11-07 LAB — CBC
HCT: 43.8 % (ref 36.0–46.0)
HCT: 39.6 % (ref 36.0–46.0)
Hemoglobin: 14.9 g/dL (ref 12.0–15.0)
Hemoglobin: 13.4 g/dL (ref 12.0–15.0)
MCH: 31.9 pg (ref 26.0–34.0)
MCH: 32.4 pg (ref 26.0–34.0)
MCHC: 34 g/dL (ref 30.0–36.0)
MCHC: 33.8 g/dL (ref 30.0–36.0)
MCV: 93.8 fL (ref 80.0–100.0)
MCV: 95.9 fL (ref 80.0–100.0)
Platelets: 354 10*3/uL (ref 150–400)
Platelets: 292 10*3/uL (ref 150–400)
RBC: 4.67 MIL/uL (ref 3.87–5.11)
RBC: 4.13 MIL/uL (ref 3.87–5.11)
RDW: 13.2 % (ref 11.5–15.5)
RDW: 13.4 % (ref 11.5–15.5)
WBC: 14.7 10*3/uL — ABNORMAL HIGH (ref 4.0–10.5)
WBC: 20.2 10*3/uL — ABNORMAL HIGH (ref 4.0–10.5)
nRBC: 0 % (ref 0.0–0.2)
nRBC: 0 % (ref 0.0–0.2)

## 2019-11-07 LAB — URINE CULTURE

## 2019-11-07 LAB — COMPREHENSIVE METABOLIC PANEL
ALT: 62 U/L — ABNORMAL HIGH (ref 0–44)
AST: 38 U/L (ref 15–41)
Albumin: 3.9 g/dL (ref 3.5–5.0)
Alkaline Phosphatase: 68 U/L (ref 38–126)
Anion gap: 14 (ref 5–15)
BUN: 14 mg/dL (ref 6–20)
CO2: 22 mmol/L (ref 22–32)
Calcium: 9.5 mg/dL (ref 8.9–10.3)
Chloride: 102 mmol/L (ref 98–111)
Creatinine, Ser: 0.82 mg/dL (ref 0.44–1.00)
GFR calc Af Amer: >60 mL/min (ref >60)
GFR calc non Af Amer: >60 mL/min (ref >60)
Glucose, Bld: 137 mg/dL — ABNORMAL HIGH (ref 70–99)
Potassium: 3.7 mmol/L (ref 3.5–5.1)
Sodium: 138 mmol/L (ref 135–145)
Total Bilirubin: 1.5 mg/dL — ABNORMAL HIGH (ref 0.3–1.2)
Total Protein: 7.1 g/dL (ref 6.5–8.1)

## 2019-11-07 LAB — URINALYSIS, ROUTINE W REFLEX MICROSCOPIC
Bilirubin Urine: NEGATIVE
Glucose, UA: NEGATIVE mg/dL
Ketones, ur: NEGATIVE mg/dL
Nitrite: POSITIVE — AB
Protein, ur: 30 mg/dL — AB
Specific Gravity, Urine: 1.021 (ref 1.005–1.030)
WBC, UA: >50 WBC/hpf — ABNORMAL HIGH (ref 0–5)
pH: 5 (ref 5.0–8.0)

## 2019-11-07 LAB — I-STAT BETA HCG BLOOD, ED (MC, WL, AP ONLY): I-stat hCG, quantitative: <5 m[IU]/mL (ref <5)

## 2019-11-07 LAB — BLOOD CULTURE ID PANEL (REFLEXED)

## 2019-11-07 LAB — CREATININE, SERUM
Creatinine, Ser: 0.92 mg/dL (ref 0.44–1.00)
GFR calc Af Amer: >60 mL/min (ref >60)
GFR calc non Af Amer: >60 mL/min (ref >60)

## 2019-11-07 LAB — PROTIME-INR
INR: 1.1 (ref 0.8–1.2)
Prothrombin Time: 13.7 seconds (ref 11.4–15.2)

## 2019-11-07 LAB — LIPASE, BLOOD: Lipase: 20 U/L (ref 11–51)

## 2019-11-07 LAB — LACTIC ACID, PLASMA: Lactic Acid, Venous: 1.6 mmol/L (ref 0.5–1.9)

## 2019-11-07 LAB — APTT: aPTT: 29 seconds (ref 24–36)

## 2019-11-07 LAB — SARS CORONAVIRUS 2 BY RT PCR (HOSPITAL ORDER, PERFORMED IN ~~LOC~~ HOSPITAL LAB): SARS Coronavirus 2: NEGATIVE

## 2019-11-07 SURGERY — CYSTOSCOPY, WITH RETROGRADE PYELOGRAM
Anesthesia: General | Laterality: Right

## 2019-11-07 MED ORDER — PHENYLEPHRINE 40 MCG/ML (10ML) SYRINGE FOR IV PUSH (FOR BLOOD PRESSURE SUPPORT)
PREFILLED_SYRINGE | INTRAVENOUS | Status: AC
  Filled 2019-11-07: qty 10

## 2019-11-07 MED ORDER — DEXAMETHASONE SODIUM PHOSPHATE 4 MG/ML IJ SOLN
INTRAMUSCULAR | Status: DC | PRN
  Administered 2019-11-07: 10 mg via INTRAVENOUS

## 2019-11-07 MED ORDER — DROPERIDOL 2.5 MG/ML IJ SOLN
1.2500 mg | Freq: Once | INTRAMUSCULAR | Status: AC
  Administered 2019-11-07: 07:00:00 1.25 mg via INTRAVENOUS
  Filled 2019-11-07: qty 2

## 2019-11-07 MED ORDER — DEXAMETHASONE SODIUM PHOSPHATE 10 MG/ML IJ SOLN
INTRAMUSCULAR | Status: AC
  Filled 2019-11-07: qty 1

## 2019-11-07 MED ORDER — MIDAZOLAM HCL 5 MG/5ML IJ SOLN
INTRAMUSCULAR | Status: DC | PRN
  Administered 2019-11-07: 2 mg via INTRAVENOUS

## 2019-11-07 MED ORDER — HYDROMORPHONE HCL 1 MG/ML IJ SOLN
1.0000 mg | Freq: Once | INTRAMUSCULAR | Status: AC
  Administered 2019-11-07: 1 mg via INTRAVENOUS
  Filled 2019-11-07: qty 1

## 2019-11-07 MED ORDER — PROPOFOL 10 MG/ML IV BOLUS
INTRAVENOUS | Status: DC | PRN
  Administered 2019-11-07: 200 mg via INTRAVENOUS

## 2019-11-07 MED ORDER — SODIUM CHLORIDE 0.9 % IV SOLN
2.0000 g | Freq: Once | INTRAVENOUS | Status: AC
  Administered 2019-11-07: 05:00:00 2 g via INTRAVENOUS
  Filled 2019-11-07: qty 20

## 2019-11-07 MED ORDER — FENTANYL CITRATE (PF) 100 MCG/2ML IJ SOLN
INTRAMUSCULAR | Status: DC | PRN
  Administered 2019-11-07 (×2): 25 ug via INTRAVENOUS
  Administered 2019-11-07: 50 ug via INTRAVENOUS

## 2019-11-07 MED ORDER — HYDROMORPHONE HCL 1 MG/ML IJ SOLN
1.0000 mg | Freq: Once | INTRAMUSCULAR | Status: AC
  Administered 2019-11-07: 04:00:00 1 mg via INTRAVENOUS
  Filled 2019-11-07: qty 1

## 2019-11-07 MED ORDER — AMISULPRIDE (ANTIEMETIC) 5 MG/2ML IV SOLN: 10.0000 mg | Freq: Once | INTRAVENOUS | Status: DC | PRN

## 2019-11-07 MED ORDER — FENTANYL CITRATE (PF) 100 MCG/2ML IJ SOLN
50.0000 ug | INTRAMUSCULAR | Status: AC | PRN
  Administered 2019-11-07 (×2): 50 ug via INTRAVENOUS
  Filled 2019-11-07 (×2): qty 2

## 2019-11-07 MED ORDER — KETOROLAC TROMETHAMINE 30 MG/ML IJ SOLN
INTRAMUSCULAR | Status: AC
  Filled 2019-11-07: qty 1

## 2019-11-07 MED ORDER — KETOROLAC TROMETHAMINE 30 MG/ML IJ SOLN
INTRAMUSCULAR | Status: DC | PRN
  Administered 2019-11-07: 30 mg via INTRAVENOUS

## 2019-11-07 MED ORDER — HYDROMORPHONE HCL 1 MG/ML IJ SOLN: 0.5000 mg | INTRAMUSCULAR | Status: DC | PRN

## 2019-11-07 MED ORDER — DIPHENHYDRAMINE HCL 50 MG/ML IJ SOLN
INTRAMUSCULAR | Status: DC | PRN
  Administered 2019-11-07: 12.5 mg via INTRAVENOUS

## 2019-11-07 MED ORDER — FENTANYL CITRATE (PF) 100 MCG/2ML IJ SOLN
INTRAMUSCULAR | Status: AC
  Filled 2019-11-07: qty 2

## 2019-11-07 MED ORDER — HYOSCYAMINE SULFATE 0.125 MG SL SUBL
0.1250 mg | SUBLINGUAL_TABLET | SUBLINGUAL | Status: DC | PRN
  Filled 2019-11-07: qty 1

## 2019-11-07 MED ORDER — FLUTICASONE PROPIONATE 50 MCG/ACT NA SUSP
1.0000 | Freq: Every day | NASAL | Status: DC
  Administered 2019-11-09: 1 via NASAL
  Filled 2019-11-07: qty 16

## 2019-11-07 MED ORDER — IOHEXOL 300 MG/ML  SOLN
INTRAMUSCULAR | Status: DC | PRN
  Administered 2019-11-07: 7 mL

## 2019-11-07 MED ORDER — ACETAMINOPHEN 325 MG PO TABS
650.0000 mg | ORAL_TABLET | Freq: Once | ORAL | Status: AC
  Administered 2019-11-07: 07:00:00 650 mg via ORAL
  Filled 2019-11-07: qty 2

## 2019-11-07 MED ORDER — SODIUM CHLORIDE 0.9 % IV BOLUS
1000.0000 mL | Freq: Once | INTRAVENOUS | Status: AC
  Administered 2019-11-07: 04:00:00 1000 mL via INTRAVENOUS

## 2019-11-07 MED ORDER — SODIUM CHLORIDE 0.9 % IR SOLN
Status: DC | PRN
  Administered 2019-11-07: 3000 mL via INTRAVESICAL

## 2019-11-07 MED ORDER — MIDAZOLAM HCL 2 MG/2ML IJ SOLN
INTRAMUSCULAR | Status: AC
  Filled 2019-11-07: qty 2

## 2019-11-07 MED ORDER — ENOXAPARIN SODIUM 40 MG/0.4ML ~~LOC~~ SOLN
40.0000 mg | SUBCUTANEOUS | Status: DC
  Administered 2019-11-08: 40 mg via SUBCUTANEOUS
  Filled 2019-11-07: qty 0.4

## 2019-11-07 MED ORDER — FENTANYL CITRATE (PF) 100 MCG/2ML IJ SOLN: 25.0000 ug | INTRAMUSCULAR | Status: DC | PRN

## 2019-11-07 MED ORDER — LIDOCAINE 2% (20 MG/ML) 5 ML SYRINGE
INTRAMUSCULAR | Status: AC
  Filled 2019-11-07: qty 5

## 2019-11-07 MED ORDER — SUCCINYLCHOLINE CHLORIDE 200 MG/10ML IV SOSY
PREFILLED_SYRINGE | INTRAVENOUS | Status: AC
  Filled 2019-11-07: qty 10

## 2019-11-07 MED ORDER — HYDROCODONE-ACETAMINOPHEN 5-325 MG PO TABS
1.0000 | ORAL_TABLET | ORAL | Status: DC | PRN
  Administered 2019-11-08: 18:00:00 2 via ORAL
  Administered 2019-11-08: 1 via ORAL
  Filled 2019-11-07: qty 2
  Filled 2019-11-07: qty 1

## 2019-11-07 MED ORDER — KCL IN DEXTROSE-NACL 20-5-0.45 MEQ/L-%-% IV SOLN
INTRAVENOUS | Status: DC
  Filled 2019-11-07 (×4): qty 1000

## 2019-11-07 MED ORDER — SUCCINYLCHOLINE CHLORIDE 200 MG/10ML IV SOSY
PREFILLED_SYRINGE | INTRAVENOUS | Status: DC | PRN
  Administered 2019-11-07: 140 mg via INTRAVENOUS

## 2019-11-07 MED ORDER — LIDOCAINE 2% (20 MG/ML) 5 ML SYRINGE
INTRAMUSCULAR | Status: DC | PRN
  Administered 2019-11-07: 100 mg via INTRAVENOUS

## 2019-11-07 MED ORDER — FLEET ENEMA 7-19 GM/118ML RE ENEM
1.0000 | ENEMA | Freq: Once | RECTAL | Status: DC | PRN
  Filled 2019-11-07: qty 1

## 2019-11-07 MED ORDER — CHLORHEXIDINE GLUCONATE CLOTH 2 % EX PADS
6.0000 | MEDICATED_PAD | Freq: Every day | CUTANEOUS | Status: DC
  Administered 2019-11-08: 6 via TOPICAL

## 2019-11-07 MED ORDER — ACETAMINOPHEN 325 MG PO TABS
650.0000 mg | ORAL_TABLET | ORAL | Status: DC | PRN
  Administered 2019-11-08 (×2): 650 mg via ORAL
  Filled 2019-11-07 (×2): qty 2

## 2019-11-07 MED ORDER — SENNOSIDES-DOCUSATE SODIUM 8.6-50 MG PO TABS
1.0000 | ORAL_TABLET | Freq: Every evening | ORAL | Status: DC | PRN
  Filled 2019-11-07: qty 1

## 2019-11-07 MED ORDER — ONDANSETRON HCL 4 MG/2ML IJ SOLN
4.0000 mg | Freq: Once | INTRAMUSCULAR | Status: AC | PRN
  Administered 2019-11-07: 4 mg via INTRAVENOUS
  Filled 2019-11-07: qty 2

## 2019-11-07 MED ORDER — ONDANSETRON HCL 4 MG/2ML IJ SOLN
INTRAMUSCULAR | Status: DC | PRN
  Administered 2019-11-07: 4 mg via INTRAVENOUS

## 2019-11-07 MED ORDER — TRIAMTERENE-HCTZ 37.5-25 MG PO TABS
1.0000 | ORAL_TABLET | Freq: Every day | ORAL | Status: DC
  Administered 2019-11-08 – 2019-11-09 (×2): 1 via ORAL
  Filled 2019-11-07 (×3): qty 1

## 2019-11-07 MED ORDER — ONDANSETRON HCL 4 MG/2ML IJ SOLN
INTRAMUSCULAR | Status: AC
  Filled 2019-11-07: qty 2

## 2019-11-07 MED ORDER — SODIUM CHLORIDE 0.9 % IV BOLUS
1000.0000 mL | Freq: Once | INTRAVENOUS | Status: AC
  Administered 2019-11-07: 06:00:00 1000 mL via INTRAVENOUS

## 2019-11-07 MED ORDER — ONDANSETRON HCL 4 MG/2ML IJ SOLN: 4.0000 mg | INTRAMUSCULAR | Status: DC | PRN

## 2019-11-07 MED ORDER — LACTATED RINGERS IV SOLN: INTRAVENOUS | Status: DC

## 2019-11-07 MED ORDER — DIPHENHYDRAMINE HCL 50 MG/ML IJ SOLN
INTRAMUSCULAR | Status: AC
  Filled 2019-11-07: qty 1

## 2019-11-07 MED ORDER — SODIUM CHLORIDE 0.9 % IV SOLN
2.0000 g | INTRAVENOUS | Status: DC
  Administered 2019-11-08: 2 g via INTRAVENOUS
  Filled 2019-11-07: qty 2

## 2019-11-07 MED ORDER — PROPOFOL 10 MG/ML IV BOLUS
INTRAVENOUS | Status: AC
  Filled 2019-11-07: qty 20

## 2019-11-07 MED ORDER — BISACODYL 10 MG RE SUPP
10.0000 mg | Freq: Every day | RECTAL | Status: DC | PRN
  Filled 2019-11-07: qty 1

## 2019-11-07 MED ORDER — HYDROMORPHONE HCL 1 MG/ML IJ SOLN
0.5000 mg | Freq: Once | INTRAMUSCULAR | Status: AC
  Administered 2019-11-07: 07:00:00 0.5 mg via INTRAVENOUS
  Filled 2019-11-07: qty 1

## 2019-11-07 MED ORDER — METOCLOPRAMIDE HCL 5 MG/ML IJ SOLN
10.0000 mg | Freq: Once | INTRAMUSCULAR | Status: AC
  Administered 2019-11-07: 04:00:00 10 mg via INTRAVENOUS
  Filled 2019-11-07: qty 2

## 2019-11-07 MED ORDER — PHENYLEPHRINE 40 MCG/ML (10ML) SYRINGE FOR IV PUSH (FOR BLOOD PRESSURE SUPPORT)
PREFILLED_SYRINGE | INTRAVENOUS | Status: DC | PRN
  Administered 2019-11-07: 120 ug via INTRAVENOUS

## 2019-11-07 MED ORDER — SODIUM CHLORIDE 0.9 % IV SOLN
1.0000 g | INTRAVENOUS | Status: DC
  Filled 2019-11-07: qty 10

## 2019-11-07 MED ORDER — MORPHINE SULFATE (PF) 4 MG/ML IV SOLN
4.0000 mg | Freq: Once | INTRAVENOUS | Status: AC
  Administered 2019-11-07: 03:00:00 4 mg via INTRAVENOUS
  Filled 2019-11-07: qty 1

## 2019-11-07 MED ORDER — SODIUM CHLORIDE 0.9% FLUSH
3.0000 mL | Freq: Once | INTRAVENOUS | Status: AC
  Administered 2019-11-07: 3 mL via INTRAVENOUS

## 2019-11-07 SURGICAL SUPPLY — 16 items
BAG URO CATCHER STRL LF (MISCELLANEOUS) ×3 IMPLANT
CATH FOLEY 2WAY SLVR  5CC 16FR (CATHETERS) ×3
CATH FOLEY 2WAY SLVR 5CC 16FR (CATHETERS) IMPLANT
CATH URET 5FR 28IN OPEN ENDED (CATHETERS) IMPLANT
CLOTH BEACON ORANGE TIMEOUT ST (SAFETY) ×3 IMPLANT
COVER WAND RF STERILE (DRAPES) IMPLANT
GLOVE SURG SS PI 8.0 STRL IVOR (GLOVE) IMPLANT
GOWN STRL REUS W/TWL XL LVL3 (GOWN DISPOSABLE) ×3 IMPLANT
GUIDEWIRE STR DUAL SENSOR (WIRE) ×3 IMPLANT
KIT TURNOVER KIT A (KITS) IMPLANT
MANIFOLD NEPTUNE II (INSTRUMENTS) ×3 IMPLANT
PENCIL SMOKE EVACUATOR (MISCELLANEOUS) IMPLANT
STENT URET 6FRX24 CONTOUR (STENTS) ×2 IMPLANT
TRAY CYSTO PACK (CUSTOM PROCEDURE TRAY) ×3 IMPLANT
TUBING CONNECTING 10 (TUBING) ×2 IMPLANT
TUBING CONNECTING 10' (TUBING) ×1

## 2019-11-07 NOTE — Discharge Instructions (Signed)

## 2019-11-07 NOTE — Progress Notes (Signed)
PHARMACY - PHYSICIAN COMMUNICATION CRITICAL VALUE ALERT - BLOOD CULTURE IDENTIFICATION (BCID)  Vanessa Bean is an 44 y.o. female who presented to [facility] on 11/07/2019 with a chief complaint of flank pain  Allergies  Allergen Reactions  . Percocet [Oxycodone-Acetaminophen] Other (See Comments)    Hallucinations in large doses    Assessment: Pt has PMH significant for kidney stones. Pt underwent cystoscopy and insertion of right double-J stent on 5/11. BCID + 2/2 E.coli, no KPC detected.   Name of physician (or Provider) Contacted: Dr. Berneice Heinrich text paged @[phone]  (on call for urology)  Current antibiotics: Ceftriaxone 2 g IV given pre-op this morning. Current orders for ceftriaxone 1 g IV daily starting 5/12 for UTI.   Changes to prescribed antibiotics recommended: Recommend increasing ceftriaxone to 2 g IV q24h while awaiting sensitivities.   Results for orders placed or performed during the hospital encounter of 11/07/19  Blood Culture ID Panel (Reflexed) (Collected: 11/07/2019  4:30 AM)  Result Value Ref Range   Enterococcus species NOT DETECTED NOT DETECTED   Listeria monocytogenes NOT DETECTED NOT DETECTED   Staphylococcus species NOT DETECTED NOT DETECTED   Staphylococcus aureus (BCID) NOT DETECTED NOT DETECTED   Streptococcus species NOT DETECTED NOT DETECTED   Streptococcus agalactiae NOT DETECTED NOT DETECTED   Streptococcus pneumoniae NOT DETECTED NOT DETECTED   Streptococcus pyogenes NOT DETECTED NOT DETECTED   Acinetobacter baumannii NOT DETECTED NOT DETECTED   Enterobacteriaceae species DETECTED (A) NOT DETECTED   Enterobacter cloacae complex NOT DETECTED NOT DETECTED   Escherichia coli DETECTED (A) NOT DETECTED   Klebsiella oxytoca NOT DETECTED NOT DETECTED   Klebsiella pneumoniae NOT DETECTED NOT DETECTED   Proteus species NOT DETECTED NOT DETECTED   Serratia marcescens NOT DETECTED NOT DETECTED   Carbapenem resistance NOT DETECTED NOT DETECTED   Haemophilus  influenzae NOT DETECTED NOT DETECTED   Neisseria meningitidis NOT DETECTED NOT DETECTED   Pseudomonas aeruginosa NOT DETECTED NOT DETECTED   Candida albicans NOT DETECTED NOT DETECTED   Candida glabrata NOT DETECTED NOT DETECTED   Candida krusei NOT DETECTED NOT DETECTED   Candida parapsilosis NOT DETECTED NOT DETECTED   Candida tropicalis NOT DETECTED NOT DETECTED    01/07/2020, PharmD 11/07/2019  9:05 PM

## 2019-11-07 NOTE — Anesthesia Postprocedure Evaluation (Signed)
Anesthesia Post Note  Patient: Vanessa Bean  Procedure(s) Performed: CYSTOSCOPY RIGHT URETERAL STENT PLACEMENT WITH RETROGRADE PYELOGRAM (Right )     Patient location during evaluation: PACU Anesthesia Type: General Level of consciousness: awake and alert Pain management: pain level controlled Vital Signs Assessment: post-procedure vital signs reviewed and stable Respiratory status: spontaneous breathing, nonlabored ventilation, respiratory function stable and patient connected to nasal cannula oxygen Cardiovascular status: blood pressure returned to baseline, stable and tachycardic Postop Assessment: no apparent nausea or vomiting Anesthetic complications: no    Last Vitals:  Vitals:   11/07/19 0930 11/07/19 0945  BP: 130/81 133/84  Pulse: (!) 109 (!) 108  Resp: (!) 21 17  Temp:    SpO2: 95% 95%    Last Pain:  Vitals:   11/07/19 0945  TempSrc:   PainSc: Asleep                 Cecile Hearing

## 2019-11-07 NOTE — Anesthesia Preprocedure Evaluation (Addendum)
Anesthesia Evaluation  Patient identified by MRN, date of birth, ID band Patient awake    Reviewed: Allergy & Precautions, NPO status , Patient's Chart, lab work & pertinent test results  Airway Mallampati: II  TM Distance: >3 FB Neck ROM: Full    Dental  (+) Teeth Intact, Dental Advisory Given   Pulmonary asthma , former smoker,    Pulmonary exam normal breath sounds clear to auscultation       Cardiovascular hypertension, Pt. on medications  Rhythm:Regular Rate:Tachycardia + Systolic murmurs    Neuro/Psych negative neurological ROS  negative psych ROS   GI/Hepatic negative GI ROS, Neg liver ROS,   Endo/Other  Morbid obesity  Renal/GU Renal disease (Infected Obstructing Stone)     Musculoskeletal negative musculoskeletal ROS (+)   Abdominal   Peds  Hematology negative hematology ROS (+)   Anesthesia Other Findings Day of surgery medications reviewed with the patient.  Reproductive/Obstetrics negative OB ROS                            Anesthesia Physical Anesthesia Plan  ASA: IV and emergent  Anesthesia Plan: General   Post-op Pain Management:    Induction: Intravenous  PONV Risk Score and Plan: 4 or greater and Midazolam, Diphenhydramine, Dexamethasone and Ondansetron  Airway Management Planned: Oral ETT  Additional Equipment:   Intra-op Plan:   Post-operative Plan: Extubation in OR  Informed Consent: I have reviewed the patients History and Physical, chart, labs and discussed the procedure including the risks, benefits and alternatives for the proposed anesthesia with the patient or authorized representative who has indicated his/her understanding and acceptance.     Dental advisory given  Plan Discussed with: CRNA  Anesthesia Plan Comments:         Anesthesia Quick Evaluation

## 2019-11-07 NOTE — ED Notes (Signed)
After ambulating to the restroom pt was in much pain. Pt stated she would rather not walk that far to the restroom. Bedside commode provided in room.

## 2019-11-07 NOTE — Progress Notes (Signed)
PHARMACY NOTE:  ANTIMICROBIAL RENAL DOSAGE ADJUSTMENT  Current antimicrobial regimen includes a mismatch between antimicrobial dosage and estimated renal function.  As per policy approved by the Pharmacy & Therapeutics and Medical Executive Committees, the antimicrobial dosage will be adjusted accordingly.  Current antimicrobial dosage: Ceftriaxone 1 g IV daily  Indication: UTI, but now E.coli bacteremia  Renal Function:  Estimated Creatinine Clearance: 109.5 mL/min (by C-G formula based on SCr of 0.92 mg/dL).    Antimicrobial dosage has been changed to:  Ceftriaxone 2 g IV q24h  Thank you for allowing pharmacy to be a part of this patient's care.  Cindi Carbon, [facility] 11/07/2019 9:29 PM

## 2019-11-07 NOTE — Anesthesia Procedure Notes (Signed)
Procedure Name: Intubation Date/Time: 11/07/2019 8:07 AM Performed by: Vanessa [place], CRNA Pre-anesthesia Checklist: Patient identified, Emergency Drugs available, Suction available and Patient being monitored Patient Re-evaluated:Patient Re-evaluated prior to induction Oxygen Delivery Method: Circle system utilized Preoxygenation: Pre-oxygenation with 100% oxygen Induction Type: IV induction, Rapid sequence and Cricoid Pressure applied Laryngoscope Size: 2 and Miller Grade View: Grade I Tube type: Oral Tube size: 7.0 mm Number of attempts: 1 Airway Equipment and Method: Stylet Placement Confirmation: ETT inserted through vocal cords under direct vision,  positive ETCO2 and breath sounds checked- equal and bilateral Secured at: 21 cm Tube secured with: Tape Dental Injury: Teeth and Oropharynx as per pre-operative assessment

## 2019-11-07 NOTE — H&P (View-Only) (Signed)
Urology Consult Note   Requesting Attending Physician:  Merrily Pew, MD Service Providing Consult: Urology   Reason for Consult:  Nephrolithiasis  HPI: Vanessa Bean is seen in consultation for reasons noted above at the request of Mesner, Corene Cornea, MD for evaluation of obstructing ureteral stone.  Patient reports 1 prior stone episode remotely, spontaneous passage.  She now presents with proximately 24 hours of right flank and right lower quadrant pain.  She reports fevers, chills, nausea, emesis.  She denies chest pain or shortness of breath.  She denies a history of TUMS, current use of calcium supplementation, history of diarrhea, history of GI surgery, or history of gout.  She endorses a family history of nephrolithiasis in her mother.   Past Medical History: Past Medical History:  Diagnosis Date  . Hypertension     Past Surgical History:  History reviewed. No pertinent surgical history.  Medication: No current facility-administered medications for this encounter.   Current Outpatient Medications  Medication Sig Dispense Refill  . fluticasone (FLONASE) 50 MCG/ACT nasal spray Place 1 spray into both nostrils daily.    Marland Kitchen triamterene-hydrochlorothiazide (MAXZIDE-25) 37.5-25 MG tablet Take 1 tablet by mouth daily.    Marland Kitchen amLODipine (NORVASC) 5 MG tablet Take 1 tablet (5 mg total) by mouth daily. (Patient not taking: Reported on 11/07/2019) 30 tablet 1  . hydrochlorothiazide (HYDRODIURIL) 12.5 MG tablet Take 1 tablet (12.5 mg total) by mouth daily. (Patient not taking: Reported on 11/07/2019) 30 tablet 1  . ipratropium (ATROVENT) 0.06 % nasal spray Place 2 sprays into both nostrils 4 (four) times daily. (Patient not taking: Reported on 11/07/2019) 15 mL 1    Allergies: Allergies  Allergen Reactions  . Percocet [Oxycodone-Acetaminophen] Other (See Comments)    Hallucinations in large doses    Social History: Social History   Tobacco Use  . Smoking status: Current Every Day  Smoker  . Smokeless tobacco: Never Used  Substance Use Topics  . Alcohol use: No  . Drug use: No    Family History History reviewed. No pertinent family history.  Review of Systems 10 systems were reviewed and are negative except as noted specifically in the HPI.  Objective   Vital signs in last 24 hours: BP (!) 148/77 (BP Location: Left Arm)   Pulse (!) 131   Temp (!) 102.7 F (39.3 C) (Oral)   Resp (!) 29   Ht 5\' 3"  (1.6 m)   Wt 129.3 kg   LMP 10/12/2019   SpO2 96%   BMI 50.49 kg/m   Physical Exam General: Appears diaphoretic, washcloth over forehead, in moderate discomfort HEENT: [place]/AT, EOMI, MMM Pulmonary: Normal work of breathing Cardiovascular: HDS, adequate peripheral perfusion Abdomen: Abdomen is soft, tender to palpation across the right hemiabdomen worse in the right flank  GU: Severe right CVA tenderness Extremities: warm and well perfused Neuro: Appropriate, no focal neurological deficits  Most Recent Labs: Lab Results  Component Value Date   WBC 14.7 (H) 11/07/2019   HGB 14.9 11/07/2019   HCT 43.8 11/07/2019   PLT 354 11/07/2019    Lab Results  Component Value Date   NA 138 11/07/2019   K 3.7 11/07/2019   CL 102 11/07/2019   CO2 22 11/07/2019   BUN 14 11/07/2019   CREATININE 0.82 11/07/2019   CALCIUM 9.5 11/07/2019    Lab Results  Component Value Date   INR 1.1 11/07/2019   APTT 29 11/07/2019     Urine Culture: @[id number] (laburin,org,[id number],[id number])@   IMAGING: CT Renal Stone  Study  Result Date: 11/07/2019 CLINICAL DATA:  Right-sided flank pain EXAM: CT ABDOMEN AND PELVIS WITHOUT CONTRAST TECHNIQUE: Multidetector CT imaging of the abdomen and pelvis was performed following the standard protocol without IV contrast. COMPARISON:  July 05, 2008 FINDINGS: Lower chest: The visualized heart size within normal limits. No pericardial fluid/thickening. No hiatal hernia. The visualized portions of the lungs are clear. Hepatobiliary: Although  limited due to the lack of intravenous contrast, normal in appearance without gross focal abnormality. The patient is status post cholecystectomy. No biliary ductal dilation. Pancreas:  Unremarkable.  No surrounding inflammatory changes. Spleen: Normal in size. Although limited due to the lack of intravenous contrast, normal in appearance. Adrenals/Urinary Tract: Both adrenal glands appear normal. There is a 9 mm proximal right ureteral calculus causing mild right pelvicaliectasis and proximal ureterectasis. There is moderate right perinephric stranding. Punctate calcifications seen in the lower pole the right kidney. There is also a punctate calcification in the lower pole the left kidney. The bladder is unremarkable. Stomach/Bowel: The stomach, small bowel, and colon are normal in appearance. No inflammatory changes or obstructive findings. appendix is normal. Vascular/Lymphatic: There are no enlarged abdominal or pelvic lymph nodes. No significant gross vascular findings are present. Reproductive: The uterus and adnexa are unremarkable. Other: No evidence of abdominal wall mass or hernia. Musculoskeletal: No acute or significant osseous findings. IMPRESSION: 9 mm proximal right ureteral calculus causing mild right hydronephrosis with perinephric stranding. Punctate bilateral nonobstructing renal calculi. Electronically Signed   By: Jonna Clark M.D.   On: 11/07/2019 02:55    ------  Assessment:  44 y.o. female with history of hypertension, obesity, prior spontaneous stone passage who presented with right flank pain, fever, leukocytosis and CT that demonstrates 9 mm obstructing stone with moderate associated hydronephrosis.   We discussed the indications for acute intervention including infected obstruction, bilateral ureteral obstruction or unilateral obstruction of solitary kidney as well as other less urgent indications for decompression which would included intractable pain, N/V, and acute renal  injury. We also discussed the possible inability to place a ureteral stent in which case, the next intervention recommended would be a percutaneous nephrostomy tube. Given infected obstruction is present, plan for emergent right ureteral stent placement.  Recommendations: 1. Plan for emergent right ureteral stent placement 2. Case posted 3. Keep patient NPO 4. Send urine culture at this time, prior to OR 5. Start broad spectrum antibiotics  Thank you for this consult.

## 2019-11-07 NOTE — Op Note (Signed)
Procedure: Cystoscopy with right retrograde pyelogram and interpretation and insertion of right double-J stent.  Preop diagnosis: Right proximal stone with obstruction and sepsis.  Postop diagnosis: Same with follicular cystitis.  Surgeon: Dr. Bjorn Pippin.  Anesthesia: General.  Specimen: Urine culture from right renal pelvis.  Drain: 6 [place] by 24 cm right contour double-J stent.  EBL: None.  Complications: None.  Indications: The patient is a 44 year old female who was seen in the emergency room with sepsis and a With obstruction 6 mm proximal ureteral stone Who was felt to require urgent stenting.  Procedure: She had been given antibiotics in the ER.  She was taken operating room where general anesthetic was induced.  She was placed in lithotomy position.  Her perineum and genitalia were prepped with Betadine solution she was draped in usual sterile fashion.  Cystoscopy was performed in 23 [place] scope and 30 degree lens.  Examination revealed a normal urethra.  The bladder wall had diffuse changes consistent with follicular cystitis.  Ureteral orifices were in the normal anatomic position.  There was slight erythema around the right ureteral orifice.  The right ureteral orifice was cannulated with a 5 French opening catheter which was advanced to the proximal ureter.  Contrast was instilled but would not go above the tip of the stent and flowed back down to the bladder.  A retrograde pyelogram demonstrated no significant filling defects but it was suboptimal due to the lack of retrograde flow.  A sensor guidewire was then passed through the opening catheter and was negotiated around a kink in the proximal ureter which appeared to be below the stone which was only faintly opacified and then passed into the renal pelvis.  The open-ended catheter was passed over the wire to the kidney and the sensor wire was removed.  There was a brisk hydronephrotic drip and a specimen was obtained for  culture.  The wire was then replaced and the open-ended catheter was removed.  A 6 French by 24 cm contour double-J stent without tether was passed kidney under fluoroscopic guidance.  The wire was removed, leaving good coil in the kidney and a good coil in the bladder.  There was reflux of purulent urine around the stent during and after passage.  The cystoscope was removed and a 16 French Foley catheter was placed.  She was taken down from lithotomy position, her anesthetic was reversed and she was moved to recovery in stable condition.  There were no complications.

## 2019-11-07 NOTE — Consult Note (Signed)
Urology Consult Note   Requesting Attending Physician:  Mesner, Jason, MD Service Providing Consult: Urology   Reason for Consult:  Nephrolithiasis  HPI: Vanessa Bean is seen in consultation for reasons noted above at the request of Mesner, Jason, MD for evaluation of obstructing ureteral stone.  Patient reports 1 prior stone episode remotely, spontaneous passage.  She now presents with proximately 24 hours of right flank and right lower quadrant pain.  She reports fevers, chills, nausea, emesis.  She denies chest pain or shortness of breath.  She denies a history of TUMS, current use of calcium supplementation, history of diarrhea, history of GI surgery, or history of gout.  She endorses a family history of nephrolithiasis in her mother.   Past Medical History: Past Medical History:  Diagnosis Date  . Hypertension     Past Surgical History:  History reviewed. No pertinent surgical history.  Medication: No current facility-administered medications for this encounter.   Current Outpatient Medications  Medication Sig Dispense Refill  . fluticasone (FLONASE) 50 MCG/ACT nasal spray Place 1 spray into both nostrils daily.    . triamterene-hydrochlorothiazide (MAXZIDE-25) 37.5-25 MG tablet Take 1 tablet by mouth daily.    . amLODipine (NORVASC) 5 MG tablet Take 1 tablet (5 mg total) by mouth daily. (Patient not taking: Reported on 11/07/2019) 30 tablet 1  . hydrochlorothiazide (HYDRODIURIL) 12.5 MG tablet Take 1 tablet (12.5 mg total) by mouth daily. (Patient not taking: Reported on 11/07/2019) 30 tablet 1  . ipratropium (ATROVENT) 0.06 % nasal spray Place 2 sprays into both nostrils 4 (four) times daily. (Patient not taking: Reported on 11/07/2019) 15 mL 1    Allergies: Allergies  Allergen Reactions  . Percocet [Oxycodone-Acetaminophen] Other (See Comments)    Hallucinations in large doses    Social History: Social History   Tobacco Use  . Smoking status: Current Every Day  Smoker  . Smokeless tobacco: Never Used  Substance Use Topics  . Alcohol use: No  . Drug use: No    Family History History reviewed. No pertinent family history.  Review of Systems 10 systems were reviewed and are negative except as noted specifically in the HPI.  Objective   Vital signs in last 24 hours: BP (!) 148/77 (BP Location: Left Arm)   Pulse (!) 131   Temp (!) 102.7 F (39.3 C) (Oral)   Resp (!) 29   Ht 5' 3" (1.6 m)   Wt 129.3 kg   LMP 10/12/2019   SpO2 96%   BMI 50.49 kg/m   Physical Exam General: Appears diaphoretic, washcloth over forehead, in moderate discomfort HEENT: [place]/AT, EOMI, MMM Pulmonary: Normal work of breathing Cardiovascular: HDS, adequate peripheral perfusion Abdomen: Abdomen is soft, tender to palpation across the right hemiabdomen worse in the right flank  GU: Severe right CVA tenderness Extremities: warm and well perfused Neuro: Appropriate, no focal neurological deficits  Most Recent Labs: Lab Results  Component Value Date   WBC 14.7 (H) 11/07/2019   HGB 14.9 11/07/2019   HCT 43.8 11/07/2019   PLT 354 11/07/2019    Lab Results  Component Value Date   NA 138 11/07/2019   K 3.7 11/07/2019   CL 102 11/07/2019   CO2 22 11/07/2019   BUN 14 11/07/2019   CREATININE 0.82 11/07/2019   CALCIUM 9.5 11/07/2019    Lab Results  Component Value Date   INR 1.1 11/07/2019   APTT 29 11/07/2019     Urine Culture: @[id number](laburin,org,[id number],[id number])@   IMAGING: CT Renal Stone   Study  Result Date: 11/07/2019 CLINICAL DATA:  Right-sided flank pain EXAM: CT ABDOMEN AND PELVIS WITHOUT CONTRAST TECHNIQUE: Multidetector CT imaging of the abdomen and pelvis was performed following the standard protocol without IV contrast. COMPARISON:  July 05, 2008 FINDINGS: Lower chest: The visualized heart size within normal limits. No pericardial fluid/thickening. No hiatal hernia. The visualized portions of the lungs are clear. Hepatobiliary: Although  limited due to the lack of intravenous contrast, normal in appearance without gross focal abnormality. The patient is status post cholecystectomy. No biliary ductal dilation. Pancreas:  Unremarkable.  No surrounding inflammatory changes. Spleen: Normal in size. Although limited due to the lack of intravenous contrast, normal in appearance. Adrenals/Urinary Tract: Both adrenal glands appear normal. There is a 9 mm proximal right ureteral calculus causing mild right pelvicaliectasis and proximal ureterectasis. There is moderate right perinephric stranding. Punctate calcifications seen in the lower pole the right kidney. There is also a punctate calcification in the lower pole the left kidney. The bladder is unremarkable. Stomach/Bowel: The stomach, small bowel, and colon are normal in appearance. No inflammatory changes or obstructive findings. appendix is normal. Vascular/Lymphatic: There are no enlarged abdominal or pelvic lymph nodes. No significant gross vascular findings are present. Reproductive: The uterus and adnexa are unremarkable. Other: No evidence of abdominal wall mass or hernia. Musculoskeletal: No acute or significant osseous findings. IMPRESSION: 9 mm proximal right ureteral calculus causing mild right hydronephrosis with perinephric stranding. Punctate bilateral nonobstructing renal calculi. Electronically Signed   By: Bindu  Avutu M.D.   On: 11/07/2019 02:55    ------  Assessment:  44 y.o. female with history of hypertension, obesity, prior spontaneous stone passage who presented with right flank pain, fever, leukocytosis and CT that demonstrates 9 mm obstructing stone with moderate associated hydronephrosis.   We discussed the indications for acute intervention including infected obstruction, bilateral ureteral obstruction or unilateral obstruction of solitary kidney as well as other less urgent indications for decompression which would included intractable pain, N/V, and acute renal  injury. We also discussed the possible inability to place a ureteral stent in which case, the next intervention recommended would be a percutaneous nephrostomy tube. Given infected obstruction is present, plan for emergent right ureteral stent placement.  Recommendations: 1. Plan for emergent right ureteral stent placement 2. Case posted 3. Keep patient NPO 4. Send urine culture at this time, prior to OR 5. Start broad spectrum antibiotics  Thank you for this consult.   

## 2019-11-07 NOTE — ED Provider Notes (Signed)
Patient seen/examined in the Emergency Department in conjunction with Midlevel Provider Petrucelli Patient reports flank and abdominal pain Exam : Awake alert, appears uncomfortable due to pain Plan: Patient found to have a 9 mm stone that is infected.  Patient is febrile.  IV antibiotics have been ordered.  Patient will be admitted to urology   Zadie Rhine, MD 11/07/19 [phone]

## 2019-11-07 NOTE — ED Notes (Signed)
Pt ambulated to the restroom with minimal assistance ? ?

## 2019-11-07 NOTE — Progress Notes (Signed)
Urologic Surgery Post-op note  Subjective: The patient is doing well.  No complaints. Improved this afternoon.  Feeling better.  Pain controlled.  No nausea or emesis   Objective: Vital signs in last 24 hours: Temp:  [97.6 F (36.4 C)-102.7 F (39.3 C)] 97.6 F (36.4 C) (05/11 1423) Pulse Rate:  [98-131] 103 (05/11 1423) Resp:  [16-29] 19 (05/11 1400) BP: (121-163)/(70-97) 140/79 (05/11 1423) SpO2:  [94 %-100 %] 97 % (05/11 1423) Weight:  [129.3 kg-137 kg] 137 kg (05/11 1419)  Intake/Output from previous day: No intake/output data recorded. Intake/Output this shift: Total I/O In: 1623.6 [I.V.:1623.6] Out: 550 [Urine:550]  Physical Exam:  General: Alert and oriented. Abdomen: Soft, Nondistended.  GU: Foley in place with clear yellow urine  Lab Results: Recent Labs    11/07/19 0202 11/07/19 0924  HGB 14.9 13.4  HCT 43.8 39.6    Assessment/Plan: POD#0 right ureteral stent placement for infected and obstructing ureteral calculi.   1) Continue to monitor. AM labs. Continue Foley.

## 2019-11-07 NOTE — ED Notes (Signed)
Per previous RN, Callie Fielding, only one set of blood cultures was able to be drawn prior to antibiotic administration.

## 2019-11-07 NOTE — ED Provider Notes (Signed)
[facility] EMERGENCY DEPARTMENT Provider Note   CSN: [id number] Arrival date & time: 11/07/19  0035     History Chief Complaint  Patient presents with  . Abdominal Pain    Vanessa Bean is a 44 y.o. female with history of hypertension, tobacco use, and kidney stones who presents to the emergency department with complaints of flank/abdominal pain that began at 14:30. Patient states pain is located in the R flank, radiates into the R abdomen, it is waxing/waning, severe, very temporarily alleviated by IV analgesics in the ED. She reports associated nausea with 3 episodes of emesis, chills, & dysuria. She has only had 1 prior kidney stone that felt similar but nowhere near this severe, she has not required prior surgical intervention for kidney stone. Denies fever, hematemesis, melena, hematochezia, hematuria, or vaginal discharge. Last PO intake was around 20:00 last night.   HPI     Past Medical History:  Diagnosis Date  . Hypertension     There are no problems to display for this patient.   History reviewed. No pertinent surgical history.   OB History   No obstetric history on file.     No family history on file.  Social History   Tobacco Use  . Smoking status: Current Every Day Smoker  . Smokeless tobacco: Never Used  Substance Use Topics  . Alcohol use: No  . Drug use: No    Home Medications Prior to Admission medications   Medication Sig Start Date End Date Taking? Authorizing Provider  amLODipine (NORVASC) 5 MG tablet Take 1 tablet (5 mg total) by mouth daily. 08/29/13   Presson, Mathis Fare, PA  amoxicillin-clavulanate (AUGMENTIN) 875-125 MG per tablet Take 1 tablet by mouth 2 (two) times daily.    [provider]  benzonatate (TESSALON) 100 MG capsule Take 1 capsule (100 mg total) by mouth 3 (three) times daily as needed for cough. 08/29/13   Presson, Mathis Fare, PA  doxycycline (VIBRAMYCIN) 100 MG capsule Take 1 capsule (100  mg total) by mouth 2 (two) times daily. 08/17/13   Blake Divine, MD  hydrochlorothiazide (HYDRODIURIL) 12.5 MG tablet Take 1 tablet (12.5 mg total) by mouth daily. 08/29/13   Presson, Jess Barters H, PA  ipratropium (ATROVENT) 0.06 % nasal spray Place 2 sprays into both nostrils 4 (four) times daily. 08/29/13   Presson, Mathis Fare, PA  losartan-hydrochlorothiazide (HYZAAR) 100-12.5 MG per tablet Take 1 tablet by mouth daily. Patient reports she is taking intermittently -no job, no insurance, unable to afford medicine    [provider]    Allergies    Percocet [oxycodone-acetaminophen]  Review of Systems   Review of Systems  Constitutional: Positive for chills. Negative for fever.  Respiratory: Negative for shortness of breath.   Cardiovascular: Negative for chest pain.  Gastrointestinal: Positive for abdominal pain, nausea and vomiting. Negative for blood in stool, constipation and diarrhea.  Genitourinary: Positive for dysuria and flank pain. Negative for hematuria and vaginal discharge.  Neurological: Negative for syncope.  All other systems reviewed and are negative.   Physical Exam Updated Vital Signs BP (!) 159/97 (BP Location: Left Arm)   Pulse (!) 128   Temp 99.2 F (37.3 C) (Oral)   Resp 16   LMP 10/12/2019   SpO2 96%   Physical Exam Vitals and nursing note reviewed.  Constitutional:      General: She is in acute distress.     Appearance: She is well-developed. She is not  toxic-appearing.     Comments: Patient appears very uncomfortable.   HENT:     Head: Normocephalic and atraumatic.  Eyes:     General:        Right eye: No discharge.        Left eye: No discharge.     Conjunctiva/sclera: Conjunctivae normal.  Cardiovascular:     Rate and Rhythm: Regular rhythm. Tachycardia present.  Pulmonary:     Effort: Pulmonary effort is normal. No respiratory distress.     Breath sounds: Normal breath sounds. No wheezing, rhonchi or rales.  Abdominal:      General: There is no distension.     Palpations: Abdomen is soft.     Tenderness: There is abdominal tenderness in the right upper quadrant and right lower quadrant. There is right CVA tenderness. There is no left CVA tenderness, guarding or rebound.  Musculoskeletal:     Cervical back: Neck supple.  Skin:    General: Skin is warm and dry.     Findings: No rash.  Neurological:     Mental Status: She is alert.     Comments: Clear speech.   Psychiatric:        Behavior: Behavior normal.    ED Results / Procedures / Treatments   Labs (all labs ordered are listed, but only abnormal results are displayed) Labs Reviewed  COMPREHENSIVE METABOLIC PANEL - Abnormal; Notable for the following components:      Result Value   Glucose, Bld 137 (*)    ALT 62 (*)    Total Bilirubin 1.5 (*)    All other components within normal limits  CBC - Abnormal; Notable for the following components:   WBC 14.7 (*)    All other components within normal limits  URINALYSIS, ROUTINE W REFLEX MICROSCOPIC - Abnormal; Notable for the following components:   APPearance HAZY (*)    Hgb urine dipstick MODERATE (*)    Protein, ur 30 (*)    Nitrite POSITIVE (*)    Leukocytes,Ua MODERATE (*)    WBC, UA >50 (*)    Bacteria, UA MANY (*)    All other components within normal limits  URINE CULTURE  CULTURE, BLOOD (ROUTINE X 2)  CULTURE, BLOOD (ROUTINE X 2)  SARS CORONAVIRUS 2 BY RT PCR (HOSPITAL ORDER, [facility])  LIPASE, BLOOD  LACTIC ACID, PLASMA  APTT  PROTIME-INR  LACTIC ACID, PLASMA  I-STAT BETA HCG BLOOD, ED ([facility], [facility], [facility] ONLY)    EKG None  Radiology CT Renal Stone Study  Result Date: 11/07/2019 CLINICAL DATA:  Right-sided flank pain EXAM: CT ABDOMEN AND PELVIS WITHOUT CONTRAST TECHNIQUE: Multidetector CT imaging of the abdomen and pelvis was performed following the standard protocol without IV contrast. COMPARISON:  July 05, 2008 FINDINGS: Lower chest: The visualized  heart size within normal limits. No pericardial fluid/thickening. No hiatal hernia. The visualized portions of the lungs are clear. Hepatobiliary: Although limited due to the lack of intravenous contrast, normal in appearance without gross focal abnormality. The patient is status post cholecystectomy. No biliary ductal dilation. Pancreas:  Unremarkable.  No surrounding inflammatory changes. Spleen: Normal in size. Although limited due to the lack of intravenous contrast, normal in appearance. Adrenals/Urinary Tract: Both adrenal glands appear normal. There is a 9 mm proximal right ureteral calculus causing mild right pelvicaliectasis and proximal ureterectasis. There is moderate right perinephric stranding. Punctate calcifications seen in the lower pole the right kidney. There is also a punctate calcification in the  lower pole the left kidney. The bladder is unremarkable. Stomach/Bowel: The stomach, small bowel, and colon are normal in appearance. No inflammatory changes or obstructive findings. appendix is normal. Vascular/Lymphatic: There are no enlarged abdominal or pelvic lymph nodes. No significant gross vascular findings are present. Reproductive: The uterus and adnexa are unremarkable. Other: No evidence of abdominal wall mass or hernia. Musculoskeletal: No acute or significant osseous findings. IMPRESSION: 9 mm proximal right ureteral calculus causing mild right hydronephrosis with perinephric stranding. Punctate bilateral nonobstructing renal calculi. Electronically Signed   By: Jonna Clark M.D.   On: 11/07/2019 02:55    Procedures .Critical Care Performed by: Cherly Anderson, PA-C Authorized by: Cherly Anderson, PA-C    CRITICAL CARE Performed by: Harvie Heck   Total critical care time: 30 minutes  Critical care time was exclusive of separately billable procedures and treating other patients.  Critical care was necessary to treat or prevent imminent or  life-threatening deterioration.  Critical care was time spent personally by me on the following activities: development of treatment plan with patient and/or surrogate as well as nursing, discussions with consultants, evaluation of patient's response to treatment, examination of patient, obtaining history from patient or surrogate, ordering and performing treatments and interventions, ordering and review of laboratory studies, ordering and review of radiographic studies, pulse oximetry and re-evaluation of patient's condition.    (including critical care time)  Medications Ordered in ED Medications  fentaNYL (SUBLIMAZE) injection 50 mcg (50 mcg Intravenous Given 11/07/19 0203)  sodium chloride flush (NS) 0.9 % injection 3 mL (3 mLs Intravenous Given 11/07/19 0203)  ondansetron (ZOFRAN) injection 4 mg (4 mg Intravenous Given 11/07/19 0203)  morphine 4 MG/ML injection 4 mg (4 mg Intravenous Given 11/07/19 0302)    ED Course  I have reviewed the triage vital signs and the nursing notes.  Pertinent labs & imaging results that were available during my care of the patient were reviewed by me and considered in my medical decision making (see chart for details).    Vanessa Bean was evaluated in Emergency Department on 11/07/2019 for the symptoms described in the history of present illness. He/she was evaluated in the context of the global COVID-19 pandemic, which necessitated consideration that the patient might be at risk for infection with the SARS-CoV-2 virus that causes COVID-19. Institutional protocols and algorithms that pertain to the evaluation of patients at risk for COVID-19 are in a state of rapid change based on information released by regulatory bodies including the CDC and federal and state organizations. These policies and algorithms were followed during the patient's care in the ED.  MDM Rules/Calculators/A&P                     Patient presents to the ED with complaints of right sided  flank/abdominal pain. She appears very uncomfortable on my assessment. Noted to be tachycardic. R sided CVA/abdominal tenderness.  DDX: Kidney stone, pyelonephritis, appendicitis, cholecystitis, cholelithiasis, perf, obstruction, MSK, viral illness, ectopic pregnancy.  Additional history obtained:  Additional history obtained from review of nursing notes, prior visits, and from patient's husband at bedside.  Lab Tests:  I have reviewed & interpreted all laboratory testing. Per triage:  CBC: Leukocytosis @ 14.7. No anemia.  CMP: Mild elevation in ALT & T bili. Renal function preserved.  Lipase: WNL Preg test: Negative Urinalysis: Grossly infected, culture sent.  Imaging Studies ordered:  Imaging ordered including CT renal stone study, I independently visualized and interpreted imaging which showed  9 mm proximal right ureteral calculus causing mild right hydronephrosis with perinephric stranding. Punctate bilateral nonobstructing renal calculi ED Course:  03:35: Initial evaluation- patient very uncomfortable. I have ordered Dilaudid and NS. Will obtain rectal temp with her tachycardia and leukocytosis- concern for infection.  04:10: Rectal temp noted to elevated @ 101.8. Code sepsis activated with abx & fluids. Not hypotensive requiring 30 cc/kg bolus at this time- continue to monitor BP and lactic acid. She continues to be in pain with nausea - additional dilaudid & reglan ordered, BP 135/80 at this time.  04:30: CONSULT: Discussed with Dr. Carylon Perches on call for urology- recommends ED to ED transfer to [facility], likely plan for stent.  04:34: CONSULT: Discussed with ED physician Dr. Clayborne Dana- accepts patient in transfer.  05:10: Care link at bedside.   Portions of this note were generated with Scientist, clinical (histocompatibility and immunogenetics). Dictation errors may occur despite best attempts at proofreading.  Final Clinical Impression(s) / ED Diagnoses Final diagnoses:  Kidney stone    Rx / DC Orders ED Discharge Orders      None       Cherly Anderson, PA-C 11/07/19 0548    Zadie Rhine, MD 11/07/19 [phone]

## 2019-11-07 NOTE — ED Triage Notes (Signed)
Pt reports vaginal pain radiating up into R flank at 2pm. Pt c/o increased pain and nausea. Hx of kidney stones several years ago, but not this severe.

## 2019-11-07 NOTE — Transfer of Care (Signed)
Immediate Anesthesia Transfer of Care Note  Patient: Vanessa Bean  Procedure(s) Performed: CYSTOSCOPY RIGHT URETERAL STENT PLACEMENT WITH RETROGRADE PYELOGRAM (Right )  Patient Location: PACU  Anesthesia Type:General  Level of Consciousness: awake, alert , oriented and patient cooperative  Airway & Oxygen Therapy: Patient Spontanous Breathing and Patient connected to face mask  Post-op Assessment: Report given to RN and Post -op Vital signs reviewed and stable  Post vital signs: Reviewed and stable  Last Vitals:  Vitals Value Taken Time  BP 142/89 11/07/19 0837  Temp    Pulse 124 11/07/19 0840  Resp 21 11/07/19 0840  SpO2 96 % 11/07/19 0840  Vitals shown include unvalidated device data.  Last Pain:  Vitals:   11/07/19 0739  TempSrc: Oral  PainSc:          Complications: No apparent anesthesia complications

## 2019-11-08 ENCOUNTER — Inpatient Hospital Stay (HOSPITAL_COMMUNITY): Payer: Self-pay

## 2019-11-08 ENCOUNTER — Other Ambulatory Visit: Payer: Self-pay | Admitting: Urology

## 2019-11-08 LAB — CBC
HCT: 38.6 % (ref 36.0–46.0)
Hemoglobin: 12.8 g/dL (ref 12.0–15.0)
MCH: 32.6 pg (ref 26.0–34.0)
MCHC: 33.2 g/dL (ref 30.0–36.0)
MCV: 98.2 fL (ref 80.0–100.0)
Platelets: 250 10*3/uL (ref 150–400)
RBC: 3.93 MIL/uL (ref 3.87–5.11)
RDW: 13.7 % (ref 11.5–15.5)
WBC: 16.9 10*3/uL — ABNORMAL HIGH (ref 4.0–10.5)
nRBC: 0 % (ref 0.0–0.2)

## 2019-11-08 LAB — COMPREHENSIVE METABOLIC PANEL
ALT: 36 U/L (ref 0–44)
AST: 17 U/L (ref 15–41)
Albumin: 3.1 g/dL — ABNORMAL LOW (ref 3.5–5.0)
Alkaline Phosphatase: 56 U/L (ref 38–126)
Anion gap: 10 (ref 5–15)
BUN: 11 mg/dL (ref 6–20)
CO2: 24 mmol/L (ref 22–32)
Calcium: 8.3 mg/dL — ABNORMAL LOW (ref 8.9–10.3)
Chloride: 104 mmol/L (ref 98–111)
Creatinine, Ser: 0.6 mg/dL (ref 0.44–1.00)
GFR calc Af Amer: >60 mL/min (ref >60)
GFR calc non Af Amer: >60 mL/min (ref >60)
Glucose, Bld: 151 mg/dL — ABNORMAL HIGH (ref 70–99)
Potassium: 3.1 mmol/L — ABNORMAL LOW (ref 3.5–5.1)
Sodium: 138 mmol/L (ref 135–145)
Total Bilirubin: 0.6 mg/dL (ref 0.3–1.2)
Total Protein: 6.3 g/dL — ABNORMAL LOW (ref 6.5–8.1)

## 2019-11-08 LAB — HIV ANTIBODY (ROUTINE TESTING W REFLEX): HIV Screen 4th Generation wRfx: NONREACTIVE

## 2019-11-08 MED ORDER — OXYBUTYNIN CHLORIDE 5 MG PO TABS: 5.0000 mg | ORAL_TABLET | Freq: Three times a day (TID) | ORAL | Status: DC | PRN

## 2019-11-08 MED ORDER — TAMSULOSIN HCL 0.4 MG PO CAPS
0.4000 mg | ORAL_CAPSULE | Freq: Every day | ORAL | Status: DC
  Administered 2019-11-08 – 2019-11-09 (×2): 0.4 mg via ORAL
  Filled 2019-11-08 (×2): qty 1

## 2019-11-08 MED ORDER — VANCOMYCIN HCL 2000 MG/400ML IV SOLN: 2000.0000 mg | Freq: Two times a day (BID) | INTRAVENOUS | Status: DC

## 2019-11-08 MED ORDER — ACETAMINOPHEN 500 MG PO TABS
1000.0000 mg | ORAL_TABLET | Freq: Four times a day (QID) | ORAL | Status: DC
  Administered 2019-11-08 – 2019-11-09 (×2): 1000 mg via ORAL
  Filled 2019-11-08 (×2): qty 2

## 2019-11-08 MED ORDER — SODIUM CHLORIDE 0.9 % IV SOLN
2.0000 g | Freq: Three times a day (TID) | INTRAVENOUS | Status: DC
  Filled 2019-11-08: qty 2

## 2019-11-08 MED ORDER — ENOXAPARIN SODIUM 40 MG/0.4ML ~~LOC~~ SOLN
40.0000 mg | SUBCUTANEOUS | Status: DC
  Administered 2019-11-09: 40 mg via SUBCUTANEOUS
  Filled 2019-11-08: qty 0.4

## 2019-11-08 MED ORDER — HYDROMORPHONE HCL 2 MG PO TABS
2.0000 mg | ORAL_TABLET | ORAL | Status: DC | PRN
  Administered 2019-11-08: 2 mg via ORAL
  Filled 2019-11-08: qty 1

## 2019-11-08 MED ORDER — SODIUM CHLORIDE 0.9 % IV SOLN
1.0000 g | Freq: Three times a day (TID) | INTRAVENOUS | Status: DC
  Administered 2019-11-08 – 2019-11-09 (×3): 1 g via INTRAVENOUS
  Filled 2019-11-08 (×4): qty 1

## 2019-11-08 NOTE — Progress Notes (Signed)
   11/08/19 0932  Assess: MEWS Score  Temp (!) 102.7 F (39.3 C)  BP (!) 153/79  Pulse Rate (!) 119  Resp 16  Level of Consciousness Alert  SpO2 98 %  O2 Device Nasal Cannula  O2 Flow Rate (L/min) 2 L/min  Assess: MEWS Score  MEWS Temp 2  MEWS Systolic 0  MEWS Pulse 2  MEWS RR 0  MEWS LOC 0  MEWS Score 4  MEWS Score Color Red  Assess: if the MEWS score is Yellow or Red  Were vital signs taken at a resting state? Yes  Focused Assessment Documented focused assessment  Early Detection of Sepsis Score *See Row Information* Medium  MEWS guidelines implemented *See Row Information* Yes  Treat  MEWS Interventions Administered prn meds/treatments;Escalated (See documentation below) (paged urology)  Take Vital Signs  Increase Vital Sign Frequency  Red: Q 1hr X 4 then Q 4hr X 4, if remains red, continue Q 4hrs  Escalate  MEWS: Escalate Red: discuss with charge nurse/RN and provider, consider discussing with RRT  Notify: Charge Nurse/RN  Name of Charge Nurse/RN Notified Tommy Medal, RN  Date Charge Nurse/RN Notified 11/08/19  Time Charge Nurse/RN Notified [id number]  Notify: Provider  Provider Name/Title Dr. Carylon Perches (paged urology)  Date Provider Notified 11/08/19  Time Provider Notified [phone]  Notification Type Page  Notification Reason Other (Comment) (Red MEWS)  Response Other (Comment) (continue to monitor)  Date of Provider Response 11/08/19  Time of Provider Response [phone]

## 2019-11-08 NOTE — Progress Notes (Addendum)
Urology Progress Note   1 Day Post-Op from ureteral stent placement.   Subjective: NAEON.  She reports feeling much better.  Still febrile to 102.7 and tachycardic to 110s Excellent UOP WBC down to 16.9 from 20.2 Creatinine down to 0.6 Urine culture with GNRs Blood cultures with E Coli and Enterobacter.   Objective: Vital signs in last 24 hours: Temp:  [97.5 F (36.4 C)-102.7 F (39.3 C)] 102.7 F (39.3 C) (05/12 0932) Pulse Rate:  [89-119] 119 (05/12 0932) Resp:  [16-22] 16 (05/12 0932) BP: (124-159)/(68-93) 153/79 (05/12 0932) SpO2:  [95 %-99 %] 98 % (05/12 0932) Weight:  [427 kg] 137 kg (05/11 1419)  Intake/Output from previous day: 05/11 0701 - 05/12 0700 In: 3982 [P.O.:1140; I.V.:2842] Out: 2850 [Urine:2850] Intake/Output this shift: Total I/O In: -  Out: 500 [Urine:500]  Physical Exam:  General: Alert and oriented CV: Regular rate Lungs: No increased work of breathing Abdomen:  Soft, appropriately tender.  GU: Foley in place draining clear yellow urine  Ext: NT, No erythema  Lab Results: Recent Labs    11/07/19 0202 11/07/19 0924 11/08/19 0507  HGB 14.9 13.4 12.8  HCT 43.8 39.6 38.6   Recent Labs    11/07/19 0202 11/07/19 0202 11/07/19 0924 11/08/19 0507  NA 138  --   --  138  K 3.7  --   --  3.1*  CL 102  --   --  104  CO2 22  --   --  24  GLUCOSE 137*  --   --  151*  BUN 14  --   --  11  CREATININE 0.82   < > 0.92 0.60  CALCIUM 9.5  --   --  8.3*   < > = values in this interval not displayed.    Studies/Results: DG Abd 1 View  Result Date: 11/08/2019 CLINICAL DATA:  Ureteral calculi EXAM: ABDOMEN - 1 VIEW COMPARISON:  Renal stone CT from yesterday FINDINGS: Placement of a right ureteral stent with fully formed retention loops over the bladder and right renal shadow. The stone seen at the [facility] yesterday is not clearly seen, limited by soft tissue attenuation. IMPRESSION: Right ureteral stent in expected position.  No visible calculus.  Electronically Signed   By: Monte Fantasia M.D.   On: 11/08/2019 09:28   DG C-Arm 1-60 Min-No Report  Result Date: 11/07/2019 Fluoroscopy was utilized by the requesting physician.  No radiographic interpretation.   CT Renal Stone Study  Result Date: 11/07/2019 CLINICAL DATA:  Right-sided flank pain EXAM: CT ABDOMEN AND PELVIS WITHOUT CONTRAST TECHNIQUE: Multidetector CT imaging of the abdomen and pelvis was performed following the standard protocol without IV contrast. COMPARISON:  July 05, 2008 FINDINGS: Lower chest: The visualized heart size within normal limits. No pericardial fluid/thickening. No hiatal hernia. The visualized portions of the lungs are clear. Hepatobiliary: Although limited due to the lack of intravenous contrast, normal in appearance without gross focal abnormality. The patient is status post cholecystectomy. No biliary ductal dilation. Pancreas:  Unremarkable.  No surrounding inflammatory changes. Spleen: Normal in size. Although limited due to the lack of intravenous contrast, normal in appearance. Adrenals/Urinary Tract: Both adrenal glands appear normal. There is a 9 mm proximal right ureteral calculus causing mild right pelvicaliectasis and proximal ureterectasis. There is moderate right perinephric stranding. Punctate calcifications seen in the lower pole the right kidney. There is also a punctate calcification in the lower pole the left kidney. The bladder is unremarkable. Stomach/Bowel: The stomach, small bowel,  and colon are normal in appearance. No inflammatory changes or obstructive findings. appendix is normal. Vascular/Lymphatic: There are no enlarged abdominal or pelvic lymph nodes. No significant gross vascular findings are present. Reproductive: The uterus and adnexa are unremarkable. Other: No evidence of abdominal wall mass or hernia. Musculoskeletal: No acute or significant osseous findings. IMPRESSION: 9 mm proximal right ureteral calculus causing mild right  hydronephrosis with perinephric stranding. Punctate bilateral nonobstructing renal calculi. Electronically Signed   By: Jonna Clark M.D.   On: 11/07/2019 02:55    Assessment/Plan:  44 y.o. female s/p right ureteral stent placement for infected and obstructing stone.  Overall doing well post-op.   Urine culture growing 100k GNR.   - Multimodal analgesia. - General diet - Will broaden antibiotics to Meropenum given persistent fever and tachycardia - Continue mIVF - Lovenox, IS, OOB, SCDs - Senna - Zofran - Flomax, Ditropan - Follow up urine and blood cultures.   Dispo: Pending culture directed antibiotic therapy and clinical stability. Possibly tomorrow v Friday.    LOS: 1 day

## 2019-11-09 LAB — BASIC METABOLIC PANEL
Anion gap: 7 (ref 5–15)
BUN: 12 mg/dL (ref 6–20)
CO2: 25 mmol/L (ref 22–32)
Calcium: 8.3 mg/dL — ABNORMAL LOW (ref 8.9–10.3)
Chloride: 101 mmol/L (ref 98–111)
Creatinine, Ser: 0.71 mg/dL (ref 0.44–1.00)
GFR calc Af Amer: >60 mL/min (ref >60)
GFR calc non Af Amer: >60 mL/min (ref >60)
Glucose, Bld: 123 mg/dL — ABNORMAL HIGH (ref 70–99)
Potassium: 3.3 mmol/L — ABNORMAL LOW (ref 3.5–5.1)
Sodium: 133 mmol/L — ABNORMAL LOW (ref 135–145)

## 2019-11-09 LAB — URINE CULTURE: Culture: >=100000 — AB

## 2019-11-09 LAB — CBC
HCT: 37.9 % (ref 36.0–46.0)
Hemoglobin: 12.6 g/dL (ref 12.0–15.0)
MCH: 32.1 pg (ref 26.0–34.0)
MCHC: 33.2 g/dL (ref 30.0–36.0)
MCV: 96.4 fL (ref 80.0–100.0)
Platelets: 248 10*3/uL (ref 150–400)
RBC: 3.93 MIL/uL (ref 3.87–5.11)
RDW: 13.7 % (ref 11.5–15.5)
WBC: 6.9 10*3/uL (ref 4.0–10.5)
nRBC: 0 % (ref 0.0–0.2)

## 2019-11-09 LAB — CULTURE, BLOOD (ROUTINE X 2): Special Requests: ADEQUATE

## 2019-11-09 MED ORDER — IBUPROFEN 200 MG PO TABS
400.0000 mg | ORAL_TABLET | Freq: Four times a day (QID) | ORAL | Status: DC | PRN
  Administered 2019-11-09: 400 mg via ORAL
  Filled 2019-11-09: qty 2

## 2019-11-09 MED ORDER — SULFAMETHOXAZOLE-TRIMETHOPRIM 800-160 MG PO TABS: 1.0000 | ORAL_TABLET | Freq: Two times a day (BID) | ORAL | 0 refills | Status: DC

## 2019-11-09 NOTE — Discharge Summary (Signed)
Patient ID: SHYAH CADMUS MRN: [id number] DOB/AGE: 42-Jul-1977 42 y.o.  Admit date: 11/07/2019 Discharge date: 11/09/2019  Primary Care Physician:  Patient, No Pcp Per  Discharge Diagnoses: Ureteral calculus, urinary tract infection Present on Admission: . Sepsis due to urinary tract infection (HCC) Ureteral calculus  Consults: None   Discharge Medications: Allergies as of 11/09/2019      Reactions   Percocet [oxycodone-acetaminophen] Other (See Comments)   Hallucinations in large doses      Medication List    STOP taking these medications   amoxicillin-clavulanate 875-125 MG tablet Commonly known as: AUGMENTIN   benzonatate 100 MG capsule Commonly known as: TESSALON   doxycycline 100 MG capsule Commonly known as: VIBRAMYCIN     TAKE these medications   fluticasone 50 MCG/ACT nasal spray Commonly known as: FLONASE Place 1 spray into both nostrils daily.   sulfamethoxazole-trimethoprim 800-160 MG tablet Commonly known as: BACTRIM DS Take 1 tablet by mouth 2 (two) times daily.   triamterene-hydrochlorothiazide 37.5-25 MG tablet Commonly known as: MAXZIDE-25 Take 1 tablet by mouth daily.        Significant Diagnostic Studies:  DG Abd 1 View  Result Date: 11/08/2019 CLINICAL DATA:  Ureteral calculi EXAM: ABDOMEN - 1 VIEW COMPARISON:  Renal stone CT from yesterday FINDINGS: Placement of a right ureteral stent with fully formed retention loops over the bladder and right renal shadow. The stone seen at the UPJ yesterday is not clearly seen, limited by soft tissue attenuation. IMPRESSION: Right ureteral stent in expected position.  No visible calculus. Electronically Signed   By: Monte Fantasia M.D.   On: 11/08/2019 09:28   DG C-Arm 1-60 Min-No Report  Result Date: 11/07/2019 Fluoroscopy was utilized by the requesting physician.  No radiographic interpretation.   CT Renal Stone Study  Result Date: 11/07/2019 CLINICAL DATA:  Right-sided flank pain EXAM: CT ABDOMEN  AND PELVIS WITHOUT CONTRAST TECHNIQUE: Multidetector CT imaging of the abdomen and pelvis was performed following the standard protocol without IV contrast. COMPARISON:  July 05, 2008 FINDINGS: Lower chest: The visualized heart size within normal limits. No pericardial fluid/thickening. No hiatal hernia. The visualized portions of the lungs are clear. Hepatobiliary: Although limited due to the lack of intravenous contrast, normal in appearance without gross focal abnormality. The patient is status post cholecystectomy. No biliary ductal dilation. Pancreas:  Unremarkable.  No surrounding inflammatory changes. Spleen: Normal in size. Although limited due to the lack of intravenous contrast, normal in appearance. Adrenals/Urinary Tract: Both adrenal glands appear normal. There is a 9 mm proximal right ureteral calculus causing mild right pelvicaliectasis and proximal ureterectasis. There is moderate right perinephric stranding. Punctate calcifications seen in the lower pole the right kidney. There is also a punctate calcification in the lower pole the left kidney. The bladder is unremarkable. Stomach/Bowel: The stomach, small bowel, and colon are normal in appearance. No inflammatory changes or obstructive findings. appendix is normal. Vascular/Lymphatic: There are no enlarged abdominal or pelvic lymph nodes. No significant gross vascular findings are present. Reproductive: The uterus and adnexa are unremarkable. Other: No evidence of abdominal wall mass or hernia. Musculoskeletal: No acute or significant osseous findings. IMPRESSION: 9 mm proximal right ureteral calculus causing mild right hydronephrosis with perinephric stranding. Punctate bilateral nonobstructing renal calculi. Electronically Signed   By: Prudencio Pair M.D.   On: 11/07/2019 02:55    Brief H and P: For complete details please refer to admission H and P, but in brief this 44 year old female was admitted with an infected,  obstructing right  ureteral calculus.  Hospital Course: She was admitted urgently and underwent placement of right double-J stent for management of an infected, obstructing stone.  She grew  E. coli from blood and urine cultures.  She improved rapidly, and was felt ready for discharge on 11/09/2019.  She was discharged on 2 weeks of Bactrim for pansensitive organisms.   Day of Discharge BP 131/72 (BP Location: Left Arm)   Pulse 88   Temp 98.3 F (36.8 C) (Oral)   Resp (!) 24   Ht 5\' 3"  (1.6 m)   Wt (!) 137 kg   LMP 10/12/2019   SpO2 96%   BMI 53.50 kg/m   Results for orders placed or performed during the hospital encounter of 11/07/19 (from the past 24 hour(s))  CBC     Status: None   Collection Time: 11/09/19  5:18 AM  Result Value Ref Range   WBC 6.9 4.0 - 10.5 K/uL   RBC 3.93 3.87 - 5.11 MIL/uL   Hemoglobin 12.6 12.0 - 15.0 g/dL   HCT 11/11/19 16.1 - 09.6 %   MCV 96.4 80.0 - 100.0 fL   MCH 32.1 26.0 - 34.0 pg   MCHC 33.2 30.0 - 36.0 g/dL   RDW 04.5 40.9 - 81.1 %   Platelets 248 150 - 400 K/uL   nRBC 0.0 0.0 - 0.2 %  Basic metabolic panel     Status: Abnormal   Collection Time: 11/09/19  5:18 AM  Result Value Ref Range   Sodium 133 (L) 135 - 145 mmol/L   Potassium 3.3 (L) 3.5 - 5.1 mmol/L   Chloride 101 98 - 111 mmol/L   CO2 25 22 - 32 mmol/L   Glucose, Bld 123 (H) 70 - 99 mg/dL   BUN 12 6 - 20 mg/dL   Creatinine, Ser 11/11/19 0.44 - 1.00 mg/dL   Calcium 8.3 (L) 8.9 - 10.3 mg/dL   GFR calc non Af Amer >60 >60 mL/min   GFR calc Af Amer >60 >60 mL/min   Anion gap 7 5 - 15    Physical Exam: General: Alert and awake oriented x3 not in any acute distress. HEENT: anicteric sclera, pupils reactive to light and accommodation CVS: S1-S2 clear no murmur rubs or gallops Chest: clear to auscultation bilaterally, no wheezing rales or rhonchi Abdomen: soft nontender, nondistended, normal bowel sounds, no organomegaly Extremities: no cyanosis, clubbing or edema noted bilaterally Neuro: Cranial nerves  II-XII intact, no focal neurological deficits  Disposition: Home  Diet: No restrictions  Activity: No restrictions   Disposition and Follow-up:   We will arrange follow-up in 1 week    DISCHARGE FOLLOW-UP  Follow-up Information    [facility] Follow up.   Why: We will call you to set up an appointment for follow-up in approximately 1 week Contact information: [address] [place] [phone]          Time spent on Discharge:  10 minutes  Signed: [phone] Sivan Cuello 11/09/2019, 10:17 AM

## 2019-11-15 NOTE — Patient Instructions (Addendum)
DUE TO COVID-19 ONLY ONE VISITOR IS ALLOWED TO COME WITH YOU AND STAY IN THE WAITING ROOM ONLY DURING PRE OP AND PROCEDURE DAY OF SURGERY. THE 1 VISITOR MAY VISIT WITH YOU AFTER SURGERY IN YOUR PRIVATE ROOM DURING VISITING HOURS ONLY!  YOU NEED TO HAVE A COVID 19 TEST ON: 11/20/19 @ 10:00 am , THIS TEST MUST BE DONE BEFORE SURGERY, COME  [address], [place] , [address].  [facility]) ONCE YOUR COVID TEST IS COMPLETED, PLEASE BEGIN THE QUARANTINE INSTRUCTIONS AS OUTLINED IN YOUR HANDOUT.                Glade Stanford Stolz   Your procedure is scheduled on: 11/23/19   Report to [facility] Main  Entrance   Report to admitting at: 9:00 AM     Call this number if you have problems the morning of surgery [phone]    Remember: Do not eat solid food :After Midnight. Clear liquid diet from midnight until 8:00 am.    CLEAR LIQUID DIET   Foods Allowed                                                                     Foods Excluded  Coffee and tea, regular and decaf                             liquids that you cannot  Plain Jell-O any favor except red or purple                                           see through such as: Fruit ices (not with fruit pulp)                                     milk, soups, orange juice  Iced Popsicles                                    All solid food Carbonated beverages, regular and diet                                    Cranberry, grape and apple juices Sports drinks like Gatorade Lightly seasoned clear broth or consume(fat free) Sugar, honey syrup  Sample Menu Breakfast                                Lunch                                     Supper Cranberry juice                    Beef broth  Chicken broth Jell-O                                     Grape juice                           Apple juice Coffee or tea                        Jell-O                                      Popsicle                                                 Coffee or tea                        Coffee or tea  _____________________________________________________________________  BRUSH YOUR TEETH MORNING OF SURGERY AND RINSE YOUR MOUTH OUT, NO CHEWING GUM CANDY OR MINTS.     Take these medicines the morning of surgery with A SIP OF WATER: Bactrium.Use Flonase as usual.               You may not have any metal on your body including hair pins and              piercings  Do not wear jewelry, make-up, lotions, powders or perfumes, deodorant             Do not wear nail polish on your fingernails.  Do not shave  48 hours prior to surgery.                 Do not bring valuables to the hospital. [place].  Contacts, dentures or bridgework may not be worn into surgery.  Leave suitcase in the car. After surgery it may be brought to your room.     Patients discharged the day of surgery will not be allowed to drive home. IF YOU ARE HAVING SURGERY AND GOING HOME THE SAME DAY, YOU MUST HAVE AN ADULT TO DRIVE YOU HOME AND BE WITH YOU FOR 24 HOURS. YOU MAY GO HOME BY TAXI OR UBER OR ORTHERWISE, BUT AN ADULT MUST ACCOMPANY YOU HOME AND STAY WITH YOU FOR 24 HOURS.  Name and phone number of your driver:  Special Instructions: N/A              Please read over the following fact sheets you were given: _____________________________________________________________________  [facility] - Preparing for Surgery Before surgery, you can play an important role.  Because skin is not sterile, your skin needs to be as free of germs as possible.  You can reduce the number of germs on your skin by washing with CHG (chlorahexidine gluconate) soap before surgery.  CHG is an antiseptic cleaner which kills germs and bonds with the skin to continue killing germs even after washing. Please DO NOT use if you have an allergy to CHG or antibacterial soaps.  If your skin becomes  reddened/irritated stop using the CHG and inform your nurse when you arrive at Short Stay. Do not shave (including legs and underarms) for at least 48 hours prior to the first CHG shower.  You may shave your face/neck. Please follow these instructions carefully:  1.  Shower with CHG Soap the night before surgery and the  morning of Surgery.  2.  If you choose to wash your hair, wash your hair first as usual with your  normal  shampoo.  3.  After you shampoo, rinse your hair and body thoroughly to remove the  shampoo.                           4.  Use CHG as you would any other liquid soap.  You can apply chg directly  to the skin and wash                       Gently with a scrungie or clean washcloth.  5.  Apply the CHG Soap to your body ONLY FROM THE NECK DOWN.   Do not use on face/ open                           Wound or open sores. Avoid contact with eyes, ears mouth and genitals (private parts).                       Wash face,  Genitals (private parts) with your normal soap.             6.  Wash thoroughly, paying special attention to the area where your surgery  will be performed.  7.  Thoroughly rinse your body with warm water from the neck down.  8.  DO NOT shower/wash with your normal soap after using and rinsing off  the CHG Soap.                9.  Pat yourself dry with a clean towel.            10.  Wear clean pajamas.            11.  Place clean sheets on your bed the night of your first shower and do not  sleep with pets. Day of Surgery : Do not apply any lotions/deodorants the morning of surgery.  Please wear clean clothes to the hospital/surgery center.  FAILURE TO FOLLOW THESE INSTRUCTIONS MAY RESULT IN THE CANCELLATION OF YOUR SURGERY PATIENT SIGNATURE_________________________________  NURSE SIGNATURE__________________________________  ________________________________________________________________________

## 2019-11-16 ENCOUNTER — Encounter (HOSPITAL_COMMUNITY)
Admission: RE | Admit: 2019-11-16 | Discharge: 2019-11-16 | Disposition: A | Discharge status: Home / Self Care | Payer: Self-pay | Source: Ambulatory Visit | Attending: Urology | Admitting: Urology

## 2019-11-16 ENCOUNTER — Encounter (HOSPITAL_COMMUNITY): Payer: Self-pay

## 2019-11-16 ENCOUNTER — Other Ambulatory Visit: Payer: Self-pay

## 2019-11-16 DIAGNOSIS — Z01818 Encounter for other preprocedural examination: Secondary | ICD-10-CM | POA: Insufficient documentation

## 2019-11-16 HISTORY — DX: Personal history of urinary calculi: Z87.442

## 2019-11-16 LAB — BASIC METABOLIC PANEL
Anion gap: 13 (ref 5–15)
BUN: 17 mg/dL (ref 6–20)
CO2: 24 mmol/L (ref 22–32)
Calcium: 9.9 mg/dL (ref 8.9–10.3)
Chloride: 103 mmol/L (ref 98–111)
Creatinine, Ser: 0.66 mg/dL (ref 0.44–1.00)
GFR calc Af Amer: >60 mL/min (ref >60)
GFR calc non Af Amer: >60 mL/min (ref >60)
Glucose, Bld: 88 mg/dL (ref 70–99)
Potassium: 3.9 mmol/L (ref 3.5–5.1)
Sodium: 140 mmol/L (ref 135–145)

## 2019-11-16 LAB — CBC
HCT: 43.2 % (ref 36.0–46.0)
Hemoglobin: 14.5 g/dL (ref 12.0–15.0)
MCH: 31.7 pg (ref 26.0–34.0)
MCHC: 33.6 g/dL (ref 30.0–36.0)
MCV: 94.5 fL (ref 80.0–100.0)
Platelets: 486 10*3/uL — ABNORMAL HIGH (ref 150–400)
RBC: 4.57 MIL/uL (ref 3.87–5.11)
RDW: 12.8 % (ref 11.5–15.5)
WBC: 9.1 10*3/uL (ref 4.0–10.5)
nRBC: 0 % (ref 0.0–0.2)

## 2019-11-16 NOTE — Progress Notes (Signed)
Lab results: Platelets: [phone]

## 2019-11-20 ENCOUNTER — Other Ambulatory Visit (HOSPITAL_COMMUNITY)
Admission: RE | Admit: 2019-11-20 | Discharge: 2019-11-20 | Disposition: A | Discharge status: Home / Self Care | Payer: HRSA Program | Source: Ambulatory Visit | Attending: Urology | Admitting: Urology

## 2019-11-20 DIAGNOSIS — Z20822 Contact with and (suspected) exposure to covid-19: Secondary | ICD-10-CM | POA: Diagnosis not present

## 2019-11-20 DIAGNOSIS — Z01812 Encounter for preprocedural laboratory examination: Secondary | ICD-10-CM | POA: Insufficient documentation

## 2019-11-20 LAB — SARS CORONAVIRUS 2 (TAT 6-24 HRS): SARS Coronavirus 2: NEGATIVE

## 2019-11-22 NOTE — Anesthesia Preprocedure Evaluation (Addendum)
Anesthesia Evaluation  Patient identified by MRN, date of birth, ID band Patient awake    Reviewed: Allergy & Precautions, NPO status , Patient's Chart, lab work & pertinent test results  History of Anesthesia Complications Negative for: history of anesthetic complications  Airway Mallampati: II  TM Distance: >3 FB Neck ROM: Full    Dental no notable dental hx.    Pulmonary former smoker,    Pulmonary exam normal        Cardiovascular hypertension, Pt. on medications Normal cardiovascular exam     Neuro/Psych negative neurological ROS  negative psych ROS   GI/Hepatic negative GI ROS, Neg liver ROS,   Endo/Other  Morbid obesity  Renal/GU Renal stone  negative genitourinary   Musculoskeletal negative musculoskeletal ROS (+)   Abdominal   Peds  Hematology negative hematology ROS (+)   Anesthesia Other Findings Day of surgery medications reviewed with patient.  Reproductive/Obstetrics negative OB ROS                            Anesthesia Physical Anesthesia Plan  ASA: III  Anesthesia Plan: General   Post-op Pain Management:    Induction: Intravenous  PONV Risk Score and Plan: 3 and Treatment may vary due to age or medical condition, Ondansetron, Dexamethasone, Midazolam and Scopolamine patch - Pre-op  Airway Management Planned: Oral ETT  Additional Equipment: None  Intra-op Plan:   Post-operative Plan: Extubation in OR  Informed Consent: I have reviewed the patients History and Physical, chart, labs and discussed the procedure including the risks, benefits and alternatives for the proposed anesthesia with the patient or authorized representative who has indicated his/her understanding and acceptance.     Dental advisory given  Plan Discussed with: CRNA  Anesthesia Plan Comments:        Anesthesia Quick Evaluation

## 2019-11-23 ENCOUNTER — Ambulatory Visit (HOSPITAL_COMMUNITY)
Admission: RE | Admit: 2019-11-23 | Discharge: 2019-11-23 | Disposition: A | Discharge status: Home / Self Care | Payer: Self-pay | Source: Ambulatory Visit | Attending: Urology | Admitting: Urology

## 2019-11-23 ENCOUNTER — Ambulatory Visit (HOSPITAL_COMMUNITY): Payer: Self-pay

## 2019-11-23 ENCOUNTER — Encounter (HOSPITAL_COMMUNITY)
Admission: RE | Disposition: A | Discharge status: Home / Self Care | Payer: Self-pay | Source: Ambulatory Visit | Attending: Urology

## 2019-11-23 ENCOUNTER — Other Ambulatory Visit: Payer: Self-pay

## 2019-11-23 ENCOUNTER — Ambulatory Visit (HOSPITAL_COMMUNITY): Payer: Self-pay | Admitting: Physician Assistant

## 2019-11-23 ENCOUNTER — Encounter (HOSPITAL_COMMUNITY): Payer: Self-pay | Admitting: Urology

## 2019-11-23 ENCOUNTER — Ambulatory Visit (HOSPITAL_COMMUNITY): Payer: Self-pay | Admitting: Certified Registered Nurse Anesthetist

## 2019-11-23 DIAGNOSIS — Z6841 Body Mass Index (BMI) 40.0 and over, adult: Secondary | ICD-10-CM | POA: Insufficient documentation

## 2019-11-23 DIAGNOSIS — I10 Essential (primary) hypertension: Secondary | ICD-10-CM | POA: Insufficient documentation

## 2019-11-23 DIAGNOSIS — Z79899 Other long term (current) drug therapy: Secondary | ICD-10-CM | POA: Insufficient documentation

## 2019-11-23 DIAGNOSIS — N132 Hydronephrosis with renal and ureteral calculous obstruction: Secondary | ICD-10-CM | POA: Insufficient documentation

## 2019-11-23 DIAGNOSIS — F17200 Nicotine dependence, unspecified, uncomplicated: Secondary | ICD-10-CM | POA: Insufficient documentation

## 2019-11-23 DIAGNOSIS — E669 Obesity, unspecified: Secondary | ICD-10-CM | POA: Insufficient documentation

## 2019-11-23 DIAGNOSIS — Z885 Allergy status to narcotic agent status: Secondary | ICD-10-CM | POA: Insufficient documentation

## 2019-11-23 HISTORY — PX: CYSTOSCOPY/URETEROSCOPY/HOLMIUM LASER/STENT PLACEMENT: SHX6546

## 2019-11-23 LAB — GLUCOSE, CAPILLARY: Glucose-Capillary: 87 mg/dL (ref 70–99)

## 2019-11-23 LAB — PREGNANCY, URINE: Preg Test, Ur: NEGATIVE

## 2019-11-23 SURGERY — CYSTOSCOPY/URETEROSCOPY/HOLMIUM LASER/STENT PLACEMENT
Anesthesia: General | Laterality: Right

## 2019-11-23 MED ORDER — LACTATED RINGERS IV SOLN: INTRAVENOUS | Status: DC

## 2019-11-23 MED ORDER — CHLORHEXIDINE GLUCONATE 0.12 % MT SOLN
15.0000 mL | Freq: Once | OROMUCOSAL | Status: AC
  Administered 2019-11-23: 15 mL via OROMUCOSAL

## 2019-11-23 MED ORDER — ORAL CARE MOUTH RINSE: 15.0000 mL | Freq: Once | OROMUCOSAL | Status: AC

## 2019-11-23 MED ORDER — FENTANYL CITRATE (PF) 100 MCG/2ML IJ SOLN
INTRAMUSCULAR | Status: AC
  Filled 2019-11-23: qty 2

## 2019-11-23 MED ORDER — IOHEXOL 300 MG/ML  SOLN
INTRAMUSCULAR | Status: DC | PRN
  Administered 2019-11-23: 5 mL

## 2019-11-23 MED ORDER — LIDOCAINE 2% (20 MG/ML) 5 ML SYRINGE
INTRAMUSCULAR | Status: DC | PRN
  Administered 2019-11-23: 100 mg via INTRAVENOUS

## 2019-11-23 MED ORDER — ROCURONIUM BROMIDE 10 MG/ML (PF) SYRINGE
PREFILLED_SYRINGE | INTRAVENOUS | Status: AC
  Filled 2019-11-23: qty 10

## 2019-11-23 MED ORDER — SUGAMMADEX SODIUM 200 MG/2ML IV SOLN
INTRAVENOUS | Status: DC | PRN
  Administered 2019-11-23: 250 mg via INTRAVENOUS

## 2019-11-23 MED ORDER — PROPOFOL 10 MG/ML IV BOLUS
INTRAVENOUS | Status: AC
  Filled 2019-11-23: qty 20

## 2019-11-23 MED ORDER — 0.9 % SODIUM CHLORIDE (POUR BTL) OPTIME
TOPICAL | Status: DC | PRN
  Administered 2019-11-23: 1000 mL

## 2019-11-23 MED ORDER — DEXAMETHASONE SODIUM PHOSPHATE 10 MG/ML IJ SOLN
INTRAMUSCULAR | Status: DC | PRN
  Administered 2019-11-23: 4 mg via INTRAVENOUS

## 2019-11-23 MED ORDER — SUGAMMADEX SODIUM 500 MG/5ML IV SOLN
INTRAVENOUS | Status: AC
  Filled 2019-11-23: qty 5

## 2019-11-23 MED ORDER — LIDOCAINE 2% (20 MG/ML) 5 ML SYRINGE
INTRAMUSCULAR | Status: AC
  Filled 2019-11-23: qty 5

## 2019-11-23 MED ORDER — ROCURONIUM BROMIDE 10 MG/ML (PF) SYRINGE
PREFILLED_SYRINGE | INTRAVENOUS | Status: DC | PRN
  Administered 2019-11-23: 60 mg via INTRAVENOUS

## 2019-11-23 MED ORDER — SODIUM CHLORIDE 0.9 % IV SOLN
2.0000 g | INTRAVENOUS | Status: AC
  Administered 2019-11-23: 2 g via INTRAVENOUS
  Filled 2019-11-23: qty 20

## 2019-11-23 MED ORDER — SODIUM CHLORIDE 0.9 % IR SOLN
Status: DC | PRN
  Administered 2019-11-23: 3000 mL

## 2019-11-23 MED ORDER — ONDANSETRON HCL 4 MG/2ML IJ SOLN
INTRAMUSCULAR | Status: DC | PRN
  Administered 2019-11-23: 4 mg via INTRAVENOUS

## 2019-11-23 MED ORDER — ACETAMINOPHEN 500 MG PO TABS
1000.0000 mg | ORAL_TABLET | Freq: Once | ORAL | Status: AC
  Administered 2019-11-23: 1000 mg via ORAL
  Filled 2019-11-23: qty 2

## 2019-11-23 MED ORDER — PROPOFOL 10 MG/ML IV BOLUS
INTRAVENOUS | Status: DC | PRN
  Administered 2019-11-23: 200 mg via INTRAVENOUS
  Administered 2019-11-23: 60 mg via INTRAVENOUS

## 2019-11-23 MED ORDER — MIDAZOLAM HCL 2 MG/2ML IJ SOLN
INTRAMUSCULAR | Status: DC | PRN
  Administered 2019-11-23: 2 mg via INTRAVENOUS

## 2019-11-23 MED ORDER — FENTANYL CITRATE (PF) 100 MCG/2ML IJ SOLN
INTRAMUSCULAR | Status: DC | PRN
  Administered 2019-11-23 (×2): 50 ug via INTRAVENOUS

## 2019-11-23 MED ORDER — SCOPOLAMINE 1 MG/3DAYS TD PT72
1.0000 | MEDICATED_PATCH | Freq: Once | TRANSDERMAL | Status: DC
  Administered 2019-11-23: 1.5 mg via TRANSDERMAL
  Filled 2019-11-23: qty 1

## 2019-11-23 MED ORDER — ONDANSETRON HCL 4 MG/2ML IJ SOLN
INTRAMUSCULAR | Status: AC
  Filled 2019-11-23: qty 2

## 2019-11-23 MED ORDER — DEXAMETHASONE SODIUM PHOSPHATE 10 MG/ML IJ SOLN
INTRAMUSCULAR | Status: AC
  Filled 2019-11-23: qty 1

## 2019-11-23 MED ORDER — MIDAZOLAM HCL 2 MG/2ML IJ SOLN
INTRAMUSCULAR | Status: AC
  Filled 2019-11-23: qty 2

## 2019-11-23 SURGICAL SUPPLY — 23 items
BAG URO CATCHER STRL LF (MISCELLANEOUS) ×3 IMPLANT
BASKET STONE NCOMPASS (UROLOGICAL SUPPLIES) IMPLANT
CATH URET 5FR 28IN OPEN ENDED (CATHETERS) ×2 IMPLANT
CATH URET DUAL LUMEN 6-10FR 50 (CATHETERS) IMPLANT
CLOTH BEACON ORANGE TIMEOUT ST (SAFETY) ×3 IMPLANT
EXTRACTOR STONE NITINOL NGAGE (UROLOGICAL SUPPLIES) ×2 IMPLANT
FIBER LASER FLEXIVA 1000 (UROLOGICAL SUPPLIES) IMPLANT
FIBER LASER FLEXIVA 365 (UROLOGICAL SUPPLIES) IMPLANT
FIBER LASER FLEXIVA 550 (UROLOGICAL SUPPLIES) IMPLANT
FIBER LASER TRAC TIP (UROLOGICAL SUPPLIES) IMPLANT
GAUZE 4X4 16PLY RFD (DISPOSABLE) ×2 IMPLANT
GLOVE SURG SS PI 8.0 STRL IVOR (GLOVE) ×3 IMPLANT
GOWN STRL REUS W/TWL XL LVL3 (GOWN DISPOSABLE) ×3 IMPLANT
GUIDEWIRE STR DUAL SENSOR (WIRE) ×3 IMPLANT
IV NS IRRIG 3000ML ARTHROMATIC (IV SOLUTION) ×3 IMPLANT
KIT TURNOVER KIT A (KITS) IMPLANT
MANIFOLD NEPTUNE II (INSTRUMENTS) ×3 IMPLANT
SHEATH URETERAL 12FRX35CM (MISCELLANEOUS) ×2 IMPLANT
STENT URET 6FRX24 CONTOUR (STENTS) ×2 IMPLANT
TRAY CYSTO PACK (CUSTOM PROCEDURE TRAY) ×3 IMPLANT
TUBING CONNECTING 10 (TUBING) ×2 IMPLANT
TUBING CONNECTING 10' (TUBING) ×1
TUBING UROLOGY SET (TUBING) ×3 IMPLANT

## 2019-11-23 NOTE — Transfer of Care (Signed)
Immediate Anesthesia Transfer of Care Note  Patient: Vanessa Bean  Procedure(s) Performed: RIGHT URETEROSCOPY//STENT EXCHANGE (Right )  Patient Location: PACU  Anesthesia Type:General  Level of Consciousness: awake, alert  and oriented  Airway & Oxygen Therapy: Patient Spontanous Breathing and Patient connected to face mask oxygen  Post-op Assessment: Report given to RN, Post -op Vital signs reviewed and stable and Patient moving all extremities X 4  Post vital signs: Reviewed and stable  Last Vitals:  Vitals Value Taken Time  BP 133/78 11/23/19 1325  Temp 36.7 C 11/23/19 1325  Pulse 98 11/23/19 1325  Resp 16 11/23/19 1325  SpO2 99 % 11/23/19 1325    Last Pain:  Vitals:   11/23/19 0909  TempSrc:   PainSc: 0-No pain      Patients Stated Pain Goal: 4 (11/23/19 0909)  Complications: No apparent anesthesia complications

## 2019-11-23 NOTE — Interval H&P Note (Signed)
History and Physical Interval Note:  She completed the bactrim last night.    11/23/2019 10:58 AM  Vanessa Bean  has presented today for surgery, with the diagnosis of RIGHT PROXIMAL STONE.  The various methods of treatment have been discussed with the patient and family. After consideration of risks, benefits and other options for treatment, the patient has consented to  Procedure(s): RIGHT URETEROSCOPY/HOLMIUM LASER/STENT EXCHANGE (Right) as a surgical intervention.  The patient's history has been reviewed, patient examined, no change in status, stable for surgery.  I have reviewed the patient's chart and labs.  Questions were answered to the patient's satisfaction.     Bjorn Pippin

## 2019-11-23 NOTE — Progress Notes (Signed)
Pt d/c during downtime.  AVS unavailable since MD unable to place anything in it.  MD came to bedside told patient verbally to remove stent on Monday 5/31.  Also stated if uncomfortable doing it at home she can wait until her appointment on Thursday (June 3) and they can remove it at clinic.  He told her he sent her medication to her pharmacy for pain and antibiotic.  I informed pt and spouse on phone if she forgets any of these instructions, has difficulty urinating, uncontrolled pain, nausea/vomiting that exceeds 24 hrs, if stent gets pulled accidentally before it's supposed to, or if they have any questions or concerns to call MD's office.  No driving for 24 hrs along with no driving if taking narcotics was passed on.  Wheeled out to car by myself

## 2019-11-23 NOTE — Anesthesia Procedure Notes (Signed)
Procedure Name: Intubation Date/Time: 11/23/2019 11:21 AM Performed by: Niel Hummer, CRNA Pre-anesthesia Checklist: Patient identified, Emergency Drugs available, Suction available and Patient being monitored Patient Re-evaluated:Patient Re-evaluated prior to induction Oxygen Delivery Method: Circle System Utilized Preoxygenation: Pre-oxygenation with 100% oxygen Induction Type: IV induction Ventilation: Oral airway inserted - appropriate to patient size and Two handed mask ventilation required Laryngoscope Size: Miller and 2 Grade View: Grade II Tube type: Oral Tube size: 7.0 mm Number of attempts: 1 Airway Equipment and Method: Stylet and Oral airway Placement Confirmation: ETT inserted through vocal cords under direct vision,  positive ETCO2 and breath sounds checked- equal and bilateral Secured at: 22 cm Tube secured with: Tape Dental Injury: Teeth and Oropharynx as per pre-operative assessment  Difficulty Due To: Difficulty was anticipated Future Recommendations: Recommend- induction with short-acting agent, and alternative techniques readily available Comments: DL x2 by srna with MAC 3 blade and second time with Miller 2 blade. Patient ventilated with 100% FiO2 with oral airway in between attempts. DL by MD with Sabra Heck 2 and successful with grade II view.

## 2019-11-23 NOTE — Anesthesia Postprocedure Evaluation (Signed)
Anesthesia Post Note  Patient: Vanessa Bean  Procedure(s) Performed: RIGHT URETEROSCOPY//STENT EXCHANGE (Right )     Patient location during evaluation: PACU Anesthesia Type: General Level of consciousness: awake and alert and oriented Pain management: pain level controlled Vital Signs Assessment: post-procedure vital signs reviewed and stable Respiratory status: spontaneous breathing, nonlabored ventilation and respiratory function stable Cardiovascular status: blood pressure returned to baseline Postop Assessment: no apparent nausea or vomiting Anesthetic complications: no    Last Vitals:  Vitals:   11/23/19 1300 11/23/19 1325  BP: (!) 128/91 133/78  Pulse: 95 98  Resp: 17 16  Temp:  36.7 C  SpO2: 99% 99%    Last Pain:  Vitals:   11/23/19 0909  TempSrc:   PainSc: 0-No pain                 Kaylyn Layer

## 2019-11-23 NOTE — Op Note (Signed)
Procedure: 1.  Cystoscopy with removal of right double-J stent. 2.  Right ureteroscopic stone extraction. 3.  Insertion of right double-J stent.  Preop diagnosis: Right proximal ureteral stone.  Postop diagnosis: Right renal stone.  Surgeon: Dr. Bjorn Pippin.  Anesthesia: General.  Specimen: Stone fragments.  Drains: 6 French by 24 cm right contour double-J stent with tether.  EBL: None.  Complications: None.  Indications: The patient is a 44 year old female with a history of a right proximal ureteral stone with prior obstruction and sepsis requiring placement of a right ureteral stent.  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  Procedure: She was taken operating room she was given 2 g Rocephin.  A general anesthetic was induced.  She was placed in lithotomy position and and fitted with PAS hose.  She was prepped with Betadine solution and draped in usual sterile fashion.  Cystoscopy was performed using a 23 [place] scope and 30 degree lens.  Examination revealed a normal urethra.  The bladder wall had changes consistent with follicular cystitis.  Ureteral orifice on the left was unremarkable but on the right there was a stent loop present with some mild erythema and edema.  The stent loop was grasped with a grasping forceps and pulled the urethral meatus.  A sensor guidewire was then passed to the kidney through the ureteral catheter and the catheter was removed.  The cystoscope was then removed.  A 35 cm 12/14 digital access sheath was passed over the wire to the kidney under fluoroscopic guidance and the inner core and wire were removed.  The dual-lumen digital flexible ureteroscope was advanced to the kidney and the internal collecting system was inspected.  There was a matrix appearing stone in the upper pole with some additional material in the lower pole and this material was grasped and removed as  best as possible however some small flecks remained.  During ureteroscopy the scope was removed and a 5 [place] open-ended cath was passed to the kidney and contrast was instilled to clarify the anatomy to ensure complete inspection.  Once all of the retrievable material had been removed, the ureteroscope was removed and the guidewire was replaced to the kidney.  The access sheath was removed and a 6 [place] by 24 cm contour double-J stent with tether was passed to the kidney under fluoroscopic guidance.  The wire was removed, leaving good coil in the kidney and a good coil in the bladder.  The stent string was left exiting the urethra.  The string was tied close to the meatus and trimmed to an appropriate length and tucked into the vaginal vault.  Cystoscopy was used to drain the bladder.  She was taken down from the lithotomy position, her anesthetic was reversed and she was moved recovery in stable condition.  There were no complications.  The stone fragments were sent to the lab for analysis.

## 2019-12-01 LAB — CALCULI, WITH PHOTOGRAPH (CLINICAL LAB)
Calcium Oxalate Dihydrate: 10 %
Calcium Oxalate Monohydrate: 70 %
Hydroxyapatite: 20 %
Weight Calculi: 1 mg

## 2020-06-29 ENCOUNTER — Emergency Department (HOSPITAL_COMMUNITY): Payer: Self-pay

## 2020-06-29 ENCOUNTER — Emergency Department (HOSPITAL_COMMUNITY)
Admission: EM | Admit: 2020-06-29 | Discharge: 2020-06-29 | Disposition: A | Discharge status: Home / Self Care | Payer: Self-pay | Attending: Emergency Medicine | Admitting: Emergency Medicine

## 2020-06-29 ENCOUNTER — Encounter (HOSPITAL_COMMUNITY): Payer: Self-pay | Admitting: Emergency Medicine

## 2020-06-29 ENCOUNTER — Other Ambulatory Visit: Payer: Self-pay

## 2020-06-29 DIAGNOSIS — N23 Unspecified renal colic: Secondary | ICD-10-CM

## 2020-06-29 DIAGNOSIS — I10 Essential (primary) hypertension: Secondary | ICD-10-CM | POA: Insufficient documentation

## 2020-06-29 DIAGNOSIS — N201 Calculus of ureter: Secondary | ICD-10-CM | POA: Insufficient documentation

## 2020-06-29 DIAGNOSIS — Z79899 Other long term (current) drug therapy: Secondary | ICD-10-CM | POA: Insufficient documentation

## 2020-06-29 DIAGNOSIS — Z87891 Personal history of nicotine dependence: Secondary | ICD-10-CM | POA: Insufficient documentation

## 2020-06-29 LAB — CBC WITH DIFFERENTIAL/PLATELET
Abs Immature Granulocytes: 0.01 10*3/uL (ref 0.00–0.07)
Basophils Absolute: 0.1 10*3/uL (ref 0.0–0.1)
Basophils Relative: 1 %
Eosinophils Absolute: 0 10*3/uL (ref 0.0–0.5)
Eosinophils Relative: 0 %
HCT: 40.2 % (ref 36.0–46.0)
Hemoglobin: 14.4 g/dL (ref 12.0–15.0)
Immature Granulocytes: 0 %
Lymphocytes Relative: 30 %
Lymphs Abs: 3.1 10*3/uL (ref 0.7–4.0)
MCH: 33.6 pg (ref 26.0–34.0)
MCHC: 35.8 g/dL (ref 30.0–36.0)
MCV: 93.7 fL (ref 80.0–100.0)
Monocytes Absolute: 0.7 10*3/uL (ref 0.1–1.0)
Monocytes Relative: 7 %
Neutro Abs: 6.3 10*3/uL (ref 1.7–7.7)
Neutrophils Relative %: 62 %
Platelets: 384 10*3/uL (ref 150–400)
RBC: 4.29 MIL/uL (ref 3.87–5.11)
RDW: 12.8 % (ref 11.5–15.5)
WBC: 10.2 10*3/uL (ref 4.0–10.5)
nRBC: 0 % (ref 0.0–0.2)

## 2020-06-29 LAB — COMPREHENSIVE METABOLIC PANEL
ALT: 68 U/L — ABNORMAL HIGH (ref 0–44)
AST: 44 U/L — ABNORMAL HIGH (ref 15–41)
Albumin: 3.7 g/dL (ref 3.5–5.0)
Alkaline Phosphatase: 72 U/L (ref 38–126)
Anion gap: 11 (ref 5–15)
BUN: 14 mg/dL (ref 6–20)
CO2: 21 mmol/L — ABNORMAL LOW (ref 22–32)
Calcium: 9.2 mg/dL (ref 8.9–10.3)
Chloride: 105 mmol/L (ref 98–111)
Creatinine, Ser: 0.69 mg/dL (ref 0.44–1.00)
GFR, Estimated: >60 mL/min (ref >60)
Glucose, Bld: 118 mg/dL — ABNORMAL HIGH (ref 70–99)
Potassium: 3.6 mmol/L (ref 3.5–5.1)
Sodium: 137 mmol/L (ref 135–145)
Total Bilirubin: 0.6 mg/dL (ref 0.3–1.2)
Total Protein: 6.2 g/dL — ABNORMAL LOW (ref 6.5–8.1)

## 2020-06-29 LAB — URINALYSIS, ROUTINE W REFLEX MICROSCOPIC
Bacteria, UA: NONE SEEN
Bilirubin Urine: NEGATIVE
Glucose, UA: NEGATIVE mg/dL
Ketones, ur: NEGATIVE mg/dL
Leukocytes,Ua: NEGATIVE
Nitrite: NEGATIVE
Protein, ur: NEGATIVE mg/dL
RBC / HPF: >50 RBC/hpf — ABNORMAL HIGH (ref 0–5)
Specific Gravity, Urine: 1.012 (ref 1.005–1.030)
pH: 6 (ref 5.0–8.0)

## 2020-06-29 LAB — I-STAT BETA HCG BLOOD, ED (MC, WL, AP ONLY): I-stat hCG, quantitative: <5 m[IU]/mL (ref <5)

## 2020-06-29 MED ORDER — TAMSULOSIN HCL 0.4 MG PO CAPS: 0.4000 mg | ORAL_CAPSULE | Freq: Every day | ORAL | 0 refills | Status: AC

## 2020-06-29 MED ORDER — OXYCODONE-ACETAMINOPHEN 5-325 MG PO TABS
1.0000 | ORAL_TABLET | ORAL | Status: DC | PRN
  Administered 2020-06-29: 1 via ORAL
  Filled 2020-06-29: qty 1

## 2020-06-29 MED ORDER — ONDANSETRON 4 MG PO TBDP: 4.0000 mg | ORAL_TABLET | Freq: Three times a day (TID) | ORAL | 0 refills | Status: DC | PRN

## 2020-06-29 MED ORDER — KETOROLAC TROMETHAMINE 60 MG/2ML IM SOLN
45.0000 mg | Freq: Once | INTRAMUSCULAR | Status: AC
  Administered 2020-06-29: 45 mg via INTRAMUSCULAR
  Filled 2020-06-29: qty 2

## 2020-06-29 MED ORDER — OXYCODONE-ACETAMINOPHEN 5-325 MG PO TABS: 1.0000 | ORAL_TABLET | Freq: Four times a day (QID) | ORAL | 0 refills | Status: DC | PRN

## 2020-06-29 MED ORDER — KETOROLAC TROMETHAMINE 15 MG/ML IJ SOLN
15.0000 mg | Freq: Once | INTRAMUSCULAR | Status: AC
  Administered 2020-06-29: 15 mg via INTRAVENOUS
  Filled 2020-06-29: qty 1

## 2020-06-29 MED ORDER — TRIAMTERENE-HCTZ 37.5-25 MG PO TABS
1.0000 | ORAL_TABLET | Freq: Once | ORAL | Status: AC
  Administered 2020-06-29: 1 via ORAL
  Filled 2020-06-29: qty 1

## 2020-06-29 MED ORDER — SODIUM CHLORIDE 0.9 % IV BOLUS
1000.0000 mL | Freq: Once | INTRAVENOUS | Status: AC
  Administered 2020-06-29: 1000 mL via INTRAVENOUS

## 2020-06-29 NOTE — ED Triage Notes (Signed)
Pt presents to ED POV. Pt c/o sudden onset R flank pain and dysuria. Hx of kidney stones.

## 2020-06-29 NOTE — ED Notes (Signed)
Pt reports she is not allergic to percocet

## 2020-06-29 NOTE — ED Provider Notes (Signed)
[facility] EMERGENCY DEPARTMENT Provider Note   CSN: [id number] Arrival date & time: 06/29/20  1635     History Chief Complaint  Patient presents with  . Flank Pain    Vanessa Bean is a 45 y.o. female.  HPI      Vanessa Bean is a 45 y.o. female, with a history of renal stone requiring stent placement, HTN, presenting to the ED with right lower back pain beginning approximately 2pm today. Pain sharp/shooting, radiating to right flank and right lower abdomen, severe. Accompanied by N/V. Feels similar to previous kidney stone.  Upon my interview, patient's pain is well controlled.  She states she has Percocet listed as an allergy, however, she was told that when she had her hallucinations and confusion it was likely because she was on too high a dose.  She tolerated Percocet here in the ED without adverse effect.  Currenlty menstruating.  Denies fever/chills, other abdominal pain, chest pain, shortness of breath, difficulty urinating, dysuria, or any other complaints.  Past Medical History:  Diagnosis Date  . History of kidney stones   . Hypertension     Patient Active Problem List   Diagnosis Date Noted  . Sepsis due to urinary tract infection (HCC) 11/07/2019    Past Surgical History:  Procedure Laterality Date  . ANKLE FRACTURE SURGERY Right   . CESAREAN SECTION    . CHOLECYSTECTOMY    . CYSTOSCOPY W/ RETROGRADES Right 11/07/2019   Procedure: CYSTOSCOPY RIGHT URETERAL STENT PLACEMENT WITH RETROGRADE PYELOGRAM;  Surgeon: Bjorn Pippin, MD;  Location: [facility];  Service: Urology;  Laterality: Right;  . CYSTOSCOPY/URETEROSCOPY/HOLMIUM LASER/STENT PLACEMENT Right 11/23/2019   Procedure: RIGHT URETEROSCOPY//STENT EXCHANGE;  Surgeon: Bjorn Pippin, MD;  Location: [facility];  Service: Urology;  Laterality: Right;  . DILATION AND CURETTAGE OF UTERUS       OB History   No obstetric history on file.     History reviewed. No pertinent family history.  Social  History   Tobacco Use  . Smoking status: Former Smoker    Quit date: 2015    Years since quitting: 7.0  . Smokeless tobacco: Never Used  Vaping Use  . Vaping Use: Never used  Substance Use Topics  . Alcohol use: Yes    Comment: occas.  . Drug use: No    Home Medications Prior to Admission medications   Medication Sig Start Date End Date Taking? Authorizing Provider  ondansetron (ZOFRAN ODT) 4 MG disintegrating tablet Take 1 tablet (4 mg total) by mouth every 8 (eight) hours as needed for nausea or vomiting. 06/29/20  Yes Cowen Pesqueira C, PA-C  oxyCODONE-acetaminophen (PERCOCET/ROXICET) 5-325 MG tablet Take 1-2 tablets by mouth every 6 (six) hours as needed for severe pain. 06/29/20  Yes Jontez Redfield C, PA-C  tamsulosin (FLOMAX) 0.4 MG CAPS capsule Take 1 capsule (0.4 mg total) by mouth daily. 06/29/20 07/29/20 Yes Alanah Sakuma C, PA-C  fluticasone (FLONASE) 50 MCG/ACT nasal spray Place 1 spray into both nostrils daily.    [provider]  sulfamethoxazole-trimethoprim (BACTRIM DS) 800-160 MG tablet Take 1 tablet by mouth 2 (two) times daily. 11/09/19   Marcine Matar, MD  triamterene-hydrochlorothiazide (MAXZIDE-25) 37.5-25 MG tablet Take 1 tablet by mouth daily. 10/07/19   [provider]    Allergies    Percocet [oxycodone-acetaminophen]  Review of Systems   Review of Systems  Constitutional: Negative for chills, diaphoresis and fever.  Respiratory: Negative for shortness of breath.   Cardiovascular: Negative for  chest pain.  Gastrointestinal: Positive for nausea and vomiting. Negative for diarrhea.  Genitourinary: Positive for flank pain. Negative for difficulty urinating and dysuria.  Musculoskeletal: Positive for back pain.  Neurological: Negative for syncope and weakness.  All other systems reviewed and are negative.   Physical Exam Updated Vital Signs BP (!) 184/112 (BP Location: Right Wrist)   Pulse 98   Temp 98 F (36.7 C) (Oral)   Resp (!) 24   SpO2 98%    Physical Exam Vitals and nursing note reviewed.  Constitutional:      General: She is not in acute distress.    Appearance: She is well-developed. She is obese. She is not diaphoretic.  HENT:     Head: Normocephalic and atraumatic.     Mouth/Throat:     Mouth: Mucous membranes are moist.     Pharynx: Oropharynx is clear.  Eyes:     Conjunctiva/sclera: Conjunctivae normal.  Cardiovascular:     Rate and Rhythm: Normal rate and regular rhythm.     Pulses: Normal pulses.     Heart sounds: Normal heart sounds.  Pulmonary:     Effort: Pulmonary effort is normal. No respiratory distress.     Breath sounds: Normal breath sounds.  Abdominal:     Palpations: Abdomen is soft.     Tenderness: There is abdominal tenderness. There is right CVA tenderness. There is no left CVA tenderness or guarding.    Musculoskeletal:     Cervical back: Neck supple.  Skin:    General: Skin is warm and dry.  Neurological:     Mental Status: She is alert.  Psychiatric:        Mood and Affect: Mood and affect normal.        Speech: Speech normal.        Behavior: Behavior normal.     ED Results / Procedures / Treatments   Labs (all labs ordered are listed, but only abnormal results are displayed) Labs Reviewed  URINALYSIS, ROUTINE W REFLEX MICROSCOPIC - Abnormal; Notable for the following components:      Result Value   APPearance HAZY (*)    Hgb urine dipstick LARGE (*)    RBC / HPF >50 (*)    All other components within normal limits  COMPREHENSIVE METABOLIC PANEL - Abnormal; Notable for the following components:   CO2 21 (*)    Glucose, Bld 118 (*)    Total Protein 6.2 (*)    AST 44 (*)    ALT 68 (*)    All other components within normal limits  URINE CULTURE  CBC WITH DIFFERENTIAL/PLATELET  I-STAT BETA HCG BLOOD, ED ([facility], [facility], [facility] ONLY)    EKG None  Radiology CT Renal Stone Study  Result Date: 06/29/2020 CLINICAL DATA:  Right flank pain. EXAM: CT ABDOMEN AND PELVIS WITHOUT  CONTRAST TECHNIQUE: Multidetector CT imaging of the abdomen and pelvis was performed following the standard protocol without IV contrast. COMPARISON:  July 05, 2008. FINDINGS: Lower chest: No acute abnormality. Hepatobiliary: No focal liver abnormality is seen. Status post cholecystectomy. No biliary dilatation. Pancreas: Unremarkable. No pancreatic ductal dilatation or surrounding inflammatory changes. Spleen: Normal in size without focal abnormality. Adrenals/Urinary Tract: Adrenal glands appear normal. Bilateral nephrolithiasis is noted. Moderate right hydroureteronephrosis is noted secondary to 12 mm calculus at right ureterovesical junction. Urinary bladder is otherwise unremarkable. Stomach/Bowel: Stomach is within normal limits. Appendix appears normal. No evidence of bowel wall thickening, distention, or inflammatory changes. Vascular/Lymphatic: No significant vascular findings are  present. No enlarged abdominal or pelvic lymph nodes. Reproductive: Uterus and bilateral adnexa are unremarkable. Other: No abdominal wall hernia or abnormality. No abdominopelvic ascites. Musculoskeletal: No acute or significant osseous findings. IMPRESSION: Bilateral nephrolithiasis. Moderate right hydroureteronephrosis is noted secondary to 12 mm calculus at right ureterovesical junction. Electronically Signed   By: Marijo Conception M.D.   On: 06/29/2020 18:54    Procedures Procedures (including critical care time)  Medications Ordered in ED Medications  oxyCODONE-acetaminophen (PERCOCET/ROXICET) 5-325 MG per tablet 1 tablet (1 tablet Oral Given 06/29/20 1708)  sodium chloride 0.9 % bolus 1,000 mL (0 mLs Intravenous Stopped 06/29/20 2120)  triamterene-hydrochlorothiazide (MAXZIDE-25) 37.5-25 MG per tablet 1 tablet (1 tablet Oral Given 06/29/20 1941)  ketorolac (TORADOL) 15 MG/ML injection 15 mg (15 mg Intravenous Given 06/29/20 2008)  ketorolac (TORADOL) injection 45 mg (45 mg Intramuscular Given 06/29/20 2024)    ED  Course  I have reviewed the triage vital signs and the nursing notes.  Pertinent labs & imaging results that were available during my care of the patient were reviewed by me and considered in my medical decision making (see chart for details).  Clinical Course as of 06/29/20 2134  Sat Jun 29, 2020  2011 Spoke with Dr. Milford Cage, [facility]. We discussed the patient's symptoms, lab results, and CT findings. In addition to the 15 mg IV Toradol already given, give additional IM dose to equal a total of 60 mg. If patient's pain is well controlled, she may be discharged.  Pain medication and Flomax.  Follow-up in the office.  If she does need to return to the emergency department, urged patient to go to [facility]. [[facility]]    Clinical Course User Index [[facility]] Kalon Erhardt, Helane Gunther, PA-C   MDM Rules/Calculators/A&P                          Presents with right lower back and right flank pain accompanied by nausea and vomiting. Patient is nontoxic appearing, afebrile, not tachycardic, not tachypneic, not hypotensive, maintains excellent SPO2 on room air, and is in no apparent distress upon my exam.   I have reviewed the patient's chart to obtain more information.   I reviewed and interpreted the patient's labs and radiological studies. Renal function within normal limits.  No evidence of infection on UA. CT with evidence of a 12 mm stone at the UVJ.  Patient's pain well controlled.  Tolerating PO.   The patient was given instructions for home care as well as return precautions. Patient voices understanding of these instructions, accepts the plan, and is comfortable with discharge.  Vitals:   06/29/20 2000 06/29/20 2045 06/29/20 2100 06/29/20 2120  BP: (!) 180/99 (!) 174/94 (!) 175/88   Pulse: 93 96 97   Resp: 18 18 17    Temp:    98 F (36.7 C)  TempSrc:    Oral  SpO2: 99% 98% 97%      Final Clinical Impression(s) / ED Diagnoses Final diagnoses:  Ureterolithiasis  Ureteral colic    Rx  / DC Orders ED Discharge Orders         Ordered    oxyCODONE-acetaminophen (PERCOCET/ROXICET) 5-325 MG tablet  Every 6 hours PRN        06/29/20 2112    ondansetron (ZOFRAN ODT) 4 MG disintegrating tablet  Every 8 hours PRN        06/29/20 2112    tamsulosin (FLOMAX) 0.4 MG CAPS capsule  Daily  06/29/20 2112           Anselm Pancoast, PA-C 06/29/20 2140    Cathren Laine, MD 06/30/20 2035

## 2020-06-29 NOTE — Discharge Instructions (Signed)
  Kidney Stone There is evidence of a kidney stone on the right side.  It appears as though it is on its way out.  Some kidney stones can take up to 30 days to pass. Hydration: Hydration is key to helping a kidney stone pass.  Have a goal of half a liter of water every hour or two. Antiinflammatory medications: Take 600 mg of ibuprofen every 6 hours or 440 mg (over the counter dose) to 500 mg (prescription dose) of naproxen every 12 hours for the next 3 days. After this time, these medications may be used as needed for pain. Take these medications with food to avoid upset stomach. Choose only one of these medications, do not take them together. Acetaminophen: Should you continue to have additional pain while taking the ibuprofen or naproxen, you may add in acetaminophen (generic for Tylenol) as needed. Your daily total maximum amount of acetaminophen from all sources should be limited to 4000mg/day for persons without liver problems, or 2000mg/day for those with liver problems. Percocet: May take Percocet (oxycodone-acetaminophen) as needed for severe pain.   Do not drive or perform other dangerous activities while taking this medication as it can cause drowsiness as well as changes in reaction time and judgement.  Please note that each pill of Percocet contains 325 mg of acetaminophen (generic for Tylenol) and the above dosage limits apply. Tamsulosin: This medication is designed to help the stone pass.  Take this medication daily until stone passes. Nausea/vomiting: Use the ondansetron (generic for Zofran) for nausea or vomiting.  This medication may not prevent all vomiting or nausea, but can help facilitate better hydration. Things that can help with nausea/vomiting also include peppermint/menthol candies, vitamin B12, and ginger. Follow-up: Follow-up with the urologist as soon as possible on this matter. Return: Return to the ED for significantly increased pain, difficulty urinating, pain with  urination, fever, uncontrolled vomiting, or any other major concerns.  For prescription assistance, may try using prescription discount sites or apps, such as goodrx.com or Good Rx smart phone app. 

## 2020-06-29 NOTE — ED Notes (Signed)
Patient transported to CT via stretcher in stable condition 

## 2020-06-30 LAB — URINE CULTURE: Culture: <10000 — AB

## 2020-07-05 ENCOUNTER — Other Ambulatory Visit: Payer: Self-pay | Admitting: Urology

## 2020-07-05 NOTE — Patient Instructions (Signed)
DUE TO COVID-19 ONLY ONE VISITOR IS ALLOWED TO COME WITH YOU AND STAY IN THE WAITING ROOM ONLY DURING PRE OP AND PROCEDURE.   IF YOU WILL BE ADMITTED INTO THE HOSPITAL YOU ARE ALLOWED ONE SUPPORT PERSON DURING VISITATION HOURS ONLY (10AM -8PM)   . The support person may change daily. . The support person must pass our screening, gel in and out, and wear a mask at all times, including in the patient's room. . Patients must also wear a mask when staff or their support person are in the room.   COVID SWAB TESTING MUST BE COMPLETED ON:  Saturday, 07-13-20 @   [address] [place], [place] [address]  (Must self quarantine after testing. Follow instructions on handout.)        Your procedure is scheduled on:  Wednesday, 07-17-20   Report to [facility] Main  Entrance   Report to admitting at 9:30 AM   Call this number if you have problems the morning of surgery [phone]   Do not eat food :After Midnight.   May have liquids until 8:30 AM day of surgery  CLEAR LIQUID DIET  Foods Allowed                                                                     Foods Excluded  Water, Black Coffee and tea, regular and decaf               liquids that you cannot  Plain Jell-O in any flavor  (No red)                                      see through such as: Fruit ices (not with fruit pulp)                                      milk, soups, orange juice              Iced Popsicles (No red)                                      All solid food                                   Apple juices Sports drinks like Gatorade (No red) Lightly seasoned clear broth or consume(fat free) Sugar, honey syrup    Oral Hygiene is also important to reduce your risk of infection.                                    Remember - BRUSH YOUR TEETH THE MORNING OF SURGERY WITH YOUR REGULAR TOOTHPASTE   Do NOT smoke after Midnight   Take these medicines the morning of surgery with A SIP OF WATER:  None  You may not have any metal on your body including hair pins, jewelry, and body piercings             Do not wear make-up, lotions, powders, perfumes/cologne, or deodorant             Do not wear nail polish.  Do not shave  48 hours prior to surgery.                 Do not bring valuables to the hospital. [place] IS NOT  RESPONSIBLE   FOR VALUABLES.   Contacts, dentures or bridgework may not be worn into surgery.   Patients discharged the day of surgery will not be allowed to drive home.                Please read over the following fact sheets you were given: IF YOU HAVE QUESTIONS ABOUT YOUR PRE OP INSTRUCTIONS PLEASE CALL [phone]   [place] - Preparing for Surgery Before surgery, you can play an important role.  Because skin is not sterile, your skin needs to be as free of germs as possible.  You can reduce the number of germs on your skin by washing with CHG (chlorahexidine gluconate) soap before surgery.  CHG is an antiseptic cleaner which kills germs and bonds with the skin to continue killing germs even after washing. Please DO NOT use if you have an allergy to CHG or antibacterial soaps.  If your skin becomes reddened/irritated stop using the CHG and inform your nurse when you arrive at Short Stay. Do not shave (including legs and underarms) for at least 48 hours prior to the first CHG shower.  You may shave your face/neck.  Please follow these instructions carefully:  1.  Shower with CHG Soap the night before surgery and the  morning of surgery.  2.  If you choose to wash your hair, wash your hair first as usual with your normal  shampoo.  3.  After you shampoo, rinse your hair and body thoroughly to remove the shampoo.                             4.  Use CHG as you would any other liquid soap.  You can apply chg directly to the skin and wash.  Gently with a scrungie or clean washcloth.  5.  Apply the CHG Soap to your body ONLY FROM THE NECK DOWN.    Do   not use on face/ open                           Wound or open sores. Avoid contact with eyes, ears mouth and   genitals (private parts).                       Wash face,  Genitals (private parts) with your normal soap.             6.  Wash thoroughly, paying special attention to the area where your    surgery  will be performed.  7.  Thoroughly rinse your body with warm water from the neck down.  8.  DO NOT shower/wash with your normal soap after using and rinsing off the CHG Soap.                9.  Pat yourself dry with a clean towel.  10.  Wear clean pajamas.            11.  Place clean sheets on your bed the night of your first shower and do not  sleep with pets. Day of Surgery : Do not apply any lotions/deodorants the morning of surgery.  Please wear clean clothes to the hospital/surgery center.  FAILURE TO FOLLOW THESE INSTRUCTIONS MAY RESULT IN THE CANCELLATION OF YOUR SURGERY  PATIENT SIGNATURE_________________________________  NURSE SIGNATURE__________________________________  ________________________________________________________________________

## 2020-07-05 NOTE — Patient Instructions (Addendum)
DUE TO COVID-19 ONLY ONE VISITOR IS ALLOWED TO COME WITH YOU AND STAY IN THE WAITING ROOM ONLY DURING PRE OP AND PROCEDURE.   IF YOU WILL BE ADMITTED INTO THE HOSPITAL YOU ARE ALLOWED ONE SUPPORT PERSON DURING VISITATION HOURS ONLY (10AM -8PM)   . The support person may change daily. . The support person must pass our screening, gel in and out, and wear a mask at all times, including in the patient's room. . Patients must also wear a mask when staff or their support person are in the room.   COVID SWAB TESTING MUST BE COMPLETED ON:  Saturday, 07-13-20 @ 0947   [address] [place], [place] [address]  (Must self quarantine after testing. Follow instructions on handout.)        Your procedure is scheduled on:  Wednesday, 07-17-20   Report to [facility] Main  Entrance   Report to admitting at 9:30 AM   Call this number if you have problems the morning of surgery [phone]   Do not eat food :After Midnight.   May have liquids until 8:30 AM day of surgery  CLEAR LIQUID DIET  Foods Allowed                                                                     Foods Excluded  Water, Black Coffee and tea, regular and decaf               liquids that you cannot  Plain Jell-O in any flavor  (No red)                                      see through such as: Fruit ices (not with fruit pulp)                                      milk, soups, orange juice              Iced Popsicles (No red)                                      All solid food                                   Apple juices Sports drinks like Gatorade (No red) Lightly seasoned clear broth or consume(fat free) Sugar, honey syrup    Oral Hygiene is also important to reduce your risk of infection.                                    Remember - BRUSH YOUR TEETH THE MORNING OF SURGERY WITH YOUR REGULAR TOOTHPASTE   Do NOT smoke after Midnight   Take these medicines the morning of surgery with A SIP OF WATER:  None  You may not have any metal on your body including hair pins, jewelry, and body piercings             Do not wear make-up, lotions, powders, perfumes/cologne, or deodorant             Do not wear nail polish.  Do not shave  48 hours prior to surgery.                 Do not bring valuables to the hospital. [facility] IS NOT  RESPONSIBLE   FOR VALUABLES.   Contacts, dentures or bridgework may not be worn into surgery.   Patients discharged the day of surgery will not be allowed to drive home.                Please read over the following fact sheets you were given: IF YOU HAVE QUESTIONS ABOUT YOUR PRE OP INSTRUCTIONS PLEASE CALL [phone]   [place] - Preparing for Surgery Before surgery, you can play an important role.  Because skin is not sterile, your skin needs to be as free of germs as possible.  You can reduce the number of germs on your skin by washing with CHG (chlorahexidine gluconate) soap before surgery.  CHG is an antiseptic cleaner which kills germs and bonds with the skin to continue killing germs even after washing. Please DO NOT use if you have an allergy to CHG or antibacterial soaps.  If your skin becomes reddened/irritated stop using the CHG and inform your nurse when you arrive at Short Stay. Do not shave (including legs and underarms) for at least 48 hours prior to the first CHG shower.  You may shave your face/neck.  Please follow these instructions carefully:  1.  Shower with CHG Soap the night before surgery and the  morning of surgery.  2.  If you choose to wash your hair, wash your hair first as usual with your normal  shampoo.  3.  After you shampoo, rinse your hair and body thoroughly to remove the shampoo.                             4.  Use CHG as you would any other liquid soap.  You can apply chg directly to the skin and wash.  Gently with a scrungie or clean washcloth.  5.  Apply the CHG Soap to your body ONLY FROM THE NECK  DOWN.   Do   not use on face/ open                           Wound or open sores. Avoid contact with eyes, ears mouth and   genitals (private parts).                       Wash face,  Genitals (private parts) with your normal soap.             6.  Wash thoroughly, paying special attention to the area where your    surgery  will be performed.  7.  Thoroughly rinse your body with warm water from the neck down.  8.  DO NOT shower/wash with your normal soap after using and rinsing off the CHG Soap.                9.  Pat yourself dry with a clean towel.  10.  Wear clean pajamas.            11.  Place clean sheets on your bed the night of your first shower and do not  sleep with pets. Day of Surgery : Do not apply any lotions/deodorants the morning of surgery.  Please wear clean clothes to the hospital/surgery center.  FAILURE TO FOLLOW THESE INSTRUCTIONS MAY RESULT IN THE CANCELLATION OF YOUR SURGERY  PATIENT SIGNATURE_________________________________  NURSE SIGNATURE__________________________________  ________________________________________________________________________

## 2020-07-08 ENCOUNTER — Other Ambulatory Visit: Payer: Self-pay

## 2020-07-08 ENCOUNTER — Encounter (HOSPITAL_COMMUNITY): Payer: Self-pay

## 2020-07-08 ENCOUNTER — Encounter (HOSPITAL_COMMUNITY)
Admission: RE | Admit: 2020-07-08 | Discharge: 2020-07-08 | Disposition: A | Discharge status: Home / Self Care | Payer: Self-pay | Source: Ambulatory Visit | Attending: Urology | Admitting: Urology

## 2020-07-08 DIAGNOSIS — Z01818 Encounter for other preprocedural examination: Secondary | ICD-10-CM | POA: Insufficient documentation

## 2020-07-08 HISTORY — DX: Personal history of other diseases of the respiratory system: Z87.09

## 2020-07-08 LAB — CBC
HCT: 43.6 % (ref 36.0–46.0)
Hemoglobin: 14.9 g/dL (ref 12.0–15.0)
MCH: 32.3 pg (ref 26.0–34.0)
MCHC: 34.2 g/dL (ref 30.0–36.0)
MCV: 94.4 fL (ref 80.0–100.0)
Platelets: 381 10*3/uL (ref 150–400)
RBC: 4.62 MIL/uL (ref 3.87–5.11)
RDW: 12.4 % (ref 11.5–15.5)
WBC: 9.3 10*3/uL (ref 4.0–10.5)
nRBC: 0 % (ref 0.0–0.2)

## 2020-07-08 LAB — BASIC METABOLIC PANEL
Anion gap: 10 (ref 5–15)
BUN: 12 mg/dL (ref 6–20)
CO2: 28 mmol/L (ref 22–32)
Calcium: 9.6 mg/dL (ref 8.9–10.3)
Chloride: 100 mmol/L (ref 98–111)
Creatinine, Ser: 0.59 mg/dL (ref 0.44–1.00)
GFR, Estimated: >60 mL/min (ref >60)
Glucose, Bld: 99 mg/dL (ref 70–99)
Potassium: 3.7 mmol/L (ref 3.5–5.1)
Sodium: 138 mmol/L (ref 135–145)

## 2020-07-08 NOTE — Progress Notes (Signed)
EKG preformed during PAT appt 07/08/2020  Patient verbalized understanding of instructions that were given to them at the PAT appointment. Patient was also instructed that they will need to review over the PAT instructions again at home before surgery.

## 2020-07-13 ENCOUNTER — Other Ambulatory Visit (HOSPITAL_COMMUNITY)
Admission: RE | Admit: 2020-07-13 | Discharge: 2020-07-13 | Disposition: A | Discharge status: Home / Self Care | Payer: Self-pay | Source: Ambulatory Visit | Attending: Urology | Admitting: Urology

## 2020-07-13 DIAGNOSIS — Z20822 Contact with and (suspected) exposure to covid-19: Secondary | ICD-10-CM | POA: Insufficient documentation

## 2020-07-13 DIAGNOSIS — Z01812 Encounter for preprocedural laboratory examination: Secondary | ICD-10-CM | POA: Insufficient documentation

## 2020-07-13 LAB — SARS CORONAVIRUS 2 (TAT 6-24 HRS): SARS Coronavirus 2: NEGATIVE

## 2020-07-16 ENCOUNTER — Ambulatory Visit (HOSPITAL_COMMUNITY): Payer: Self-pay | Admitting: Certified Registered Nurse Anesthetist

## 2020-07-17 ENCOUNTER — Ambulatory Visit (HOSPITAL_COMMUNITY)
Admission: RE | Admit: 2020-07-17 | Discharge: 2020-07-17 | Disposition: A | Discharge status: Home / Self Care | Payer: Self-pay | Attending: Urology | Admitting: Urology

## 2020-07-17 ENCOUNTER — Ambulatory Visit (HOSPITAL_COMMUNITY): Payer: Self-pay

## 2020-07-17 ENCOUNTER — Encounter (HOSPITAL_COMMUNITY): Payer: Self-pay | Admitting: Urology

## 2020-07-17 ENCOUNTER — Encounter (HOSPITAL_COMMUNITY)
Admission: RE | Disposition: A | Discharge status: Home / Self Care | Payer: Self-pay | Source: Home / Self Care | Attending: Urology

## 2020-07-17 DIAGNOSIS — Z538 Procedure and treatment not carried out for other reasons: Secondary | ICD-10-CM | POA: Insufficient documentation

## 2020-07-17 DIAGNOSIS — N200 Calculus of kidney: Secondary | ICD-10-CM | POA: Insufficient documentation

## 2020-07-17 DIAGNOSIS — Z20822 Contact with and (suspected) exposure to covid-19: Secondary | ICD-10-CM | POA: Insufficient documentation

## 2020-07-17 LAB — PREGNANCY, URINE: Preg Test, Ur: NEGATIVE

## 2020-07-17 SURGERY — CYSTOSCOPY/URETEROSCOPY/HOLMIUM LASER/STENT PLACEMENT
Anesthesia: General | Laterality: Right

## 2020-07-17 MED ORDER — ONDANSETRON HCL 4 MG/2ML IJ SOLN
INTRAMUSCULAR | Status: AC
  Filled 2020-07-17: qty 2

## 2020-07-17 MED ORDER — MIDAZOLAM HCL 2 MG/2ML IJ SOLN
INTRAMUSCULAR | Status: AC
  Filled 2020-07-17: qty 2

## 2020-07-17 MED ORDER — PROPOFOL 10 MG/ML IV BOLUS
INTRAVENOUS | Status: AC
  Filled 2020-07-17: qty 20

## 2020-07-17 MED ORDER — LIDOCAINE HCL (PF) 2 % IJ SOLN
INTRAMUSCULAR | Status: AC
  Filled 2020-07-17: qty 5

## 2020-07-17 MED ORDER — ACETAMINOPHEN 500 MG PO TABS: 1000.0000 mg | ORAL_TABLET | Freq: Once | ORAL | Status: DC

## 2020-07-17 MED ORDER — ORAL CARE MOUTH RINSE: 15.0000 mL | Freq: Once | OROMUCOSAL | Status: DC

## 2020-07-17 MED ORDER — LACTATED RINGERS IV SOLN: INTRAVENOUS | Status: DC

## 2020-07-17 MED ORDER — CIPROFLOXACIN IN D5W 400 MG/200ML IV SOLN: 400.0000 mg | Freq: Once | INTRAVENOUS | Status: DC

## 2020-07-17 MED ORDER — DEXAMETHASONE SODIUM PHOSPHATE 10 MG/ML IJ SOLN
INTRAMUSCULAR | Status: AC
  Filled 2020-07-17: qty 1

## 2020-07-17 MED ORDER — FENTANYL CITRATE (PF) 100 MCG/2ML IJ SOLN
INTRAMUSCULAR | Status: AC
  Filled 2020-07-17: qty 2

## 2020-07-17 MED ORDER — CHLORHEXIDINE GLUCONATE 0.12 % MT SOLN: 15.0000 mL | Freq: Once | OROMUCOSAL | Status: DC

## 2020-07-17 NOTE — Anesthesia Preprocedure Evaluation (Deleted)
Anesthesia Evaluation    Reviewed: Allergy & Precautions, Patient's Chart, lab work & pertinent test results  Airway        Dental   Pulmonary former smoker,           Cardiovascular hypertension, negative cardio ROS       Neuro/Psych negative neurological ROS     GI/Hepatic negative GI ROS, Neg liver ROS,   Endo/Other  Morbid obesity  Renal/GU negative Renal ROS     Musculoskeletal negative musculoskeletal ROS (+)   Abdominal (+) + obese,   Peds  Hematology negative hematology ROS (+)   Anesthesia Other Findings RIGHT URETERAL CALCULUS  Reproductive/Obstetrics                             Anesthesia Physical Anesthesia Plan  ASA: III  Anesthesia Plan: General   Post-op Pain Management:    Induction: Intravenous  PONV Risk Score and Plan: 4 or greater and Ondansetron, Dexamethasone, Midazolam and Treatment may vary due to age or medical condition  Airway Management Planned:   Additional Equipment:   Intra-op Plan:   Post-operative Plan: Extubation in OR  Informed Consent:   Plan Discussed with:   Anesthesia Plan Comments:         Anesthesia Quick Evaluation

## 2020-07-17 NOTE — Progress Notes (Signed)
Patient's case cancelled due to passing stone. Dr Liliane Shi informed patient. Ms Vanessa Bean was discharged home.

## 2020-07-17 NOTE — Progress Notes (Signed)
Upon arrival to dept pt made [place] aware she quit having pain on 07/16/20 at 7p. Previously in the day, pt had increasing amounts of pain, frequency, and urgency. Pt made attempts to contact the office with no success. Dr. Liliane Shi made aware. KUB ordered.

## 2020-08-07 IMAGING — CT CT RENAL STONE PROTOCOL
2 of 4 series · 16 of 46 positions shown, 18 images · non-contrast
Comparison: July 05, 2008

CLINICAL DATA: Right-sided flank pain

EXAM:
CT ABDOMEN AND PELVIS WITHOUT CONTRAST
TECHNIQUE: Multidetector CT imaging of the abdomen and pelvis was performed
following the standard protocol without IV contrast.

[Series 3: renal stone 5.0 · axial · 0.98mm/px · z∈[-429,-9]mm · 13 of 93 slices shown, 15 images]
[im 5/93  soft-tissue]
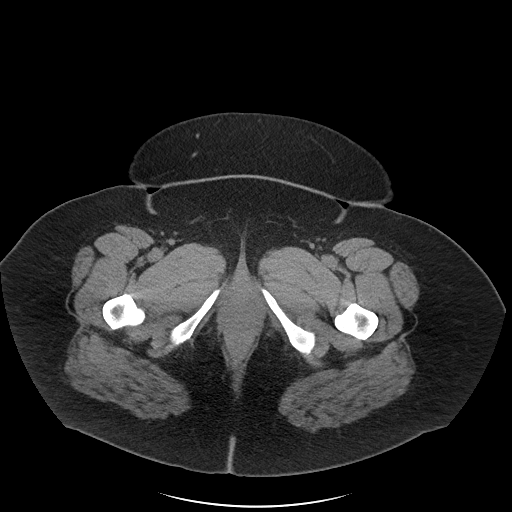
[im 5/93  bone]
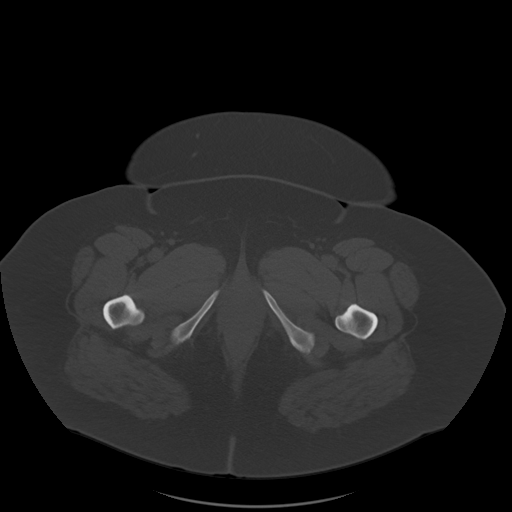
[im 13/93  soft-tissue]
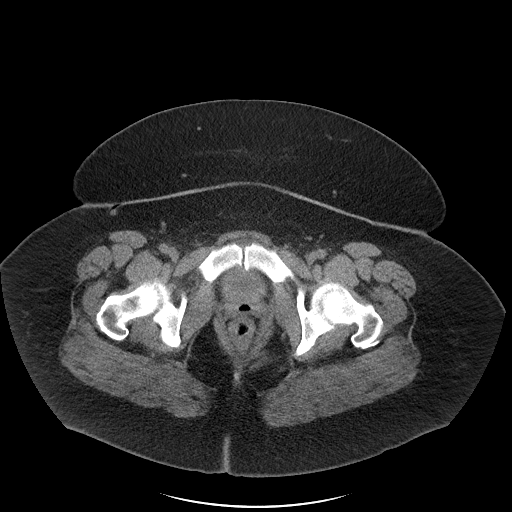
[im 21/93  soft-tissue]
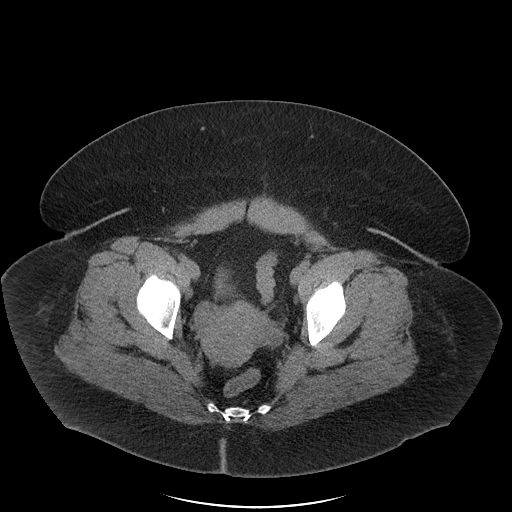
[im 25/93  soft-tissue]
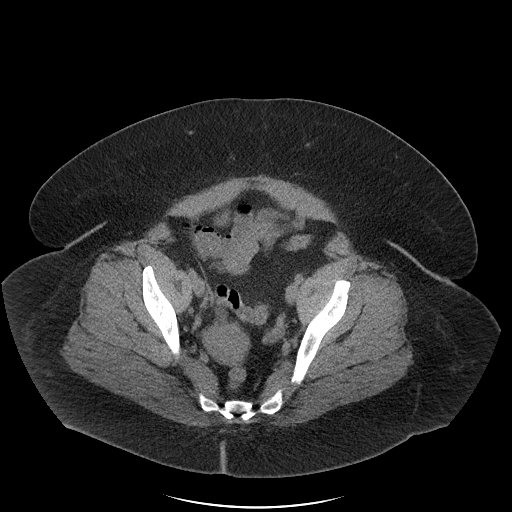
[im 33/93  soft-tissue]
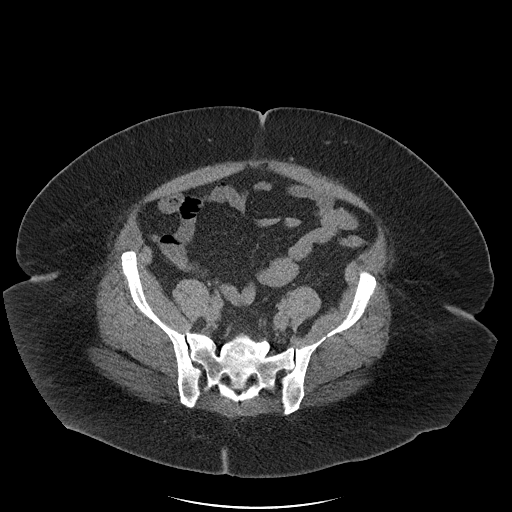
[im 41/93  soft-tissue]
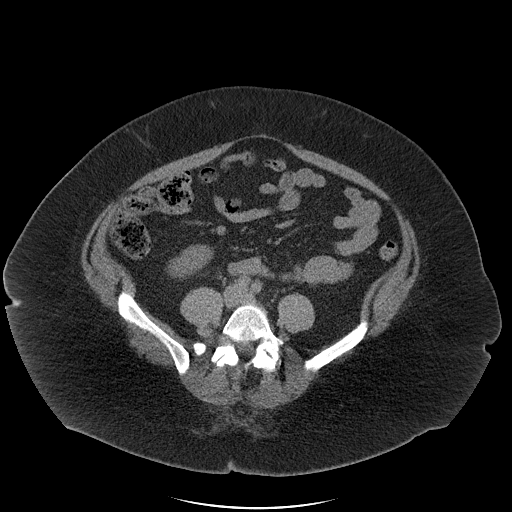
[im 49/93  soft-tissue]
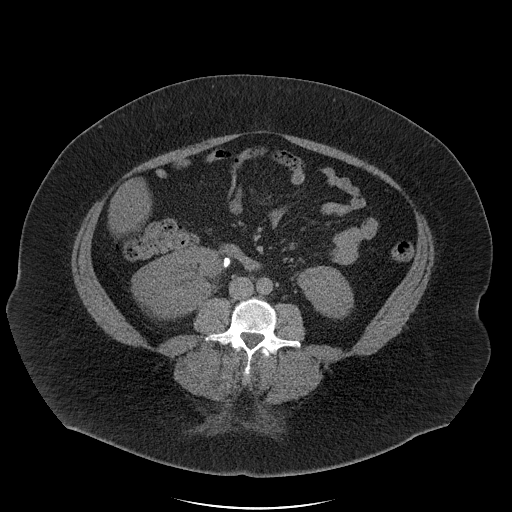
[im 53/93  soft-tissue]
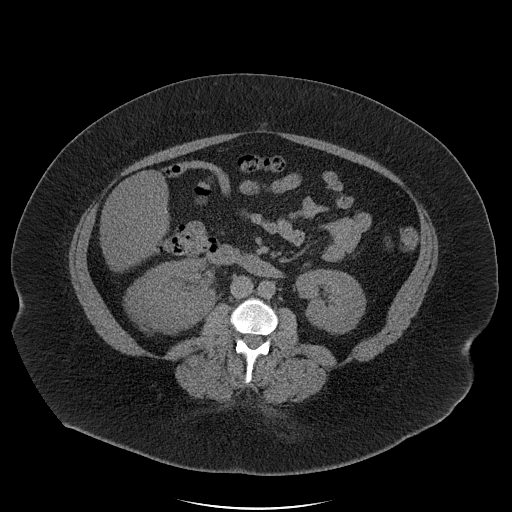
[im 61/93  soft-tissue]
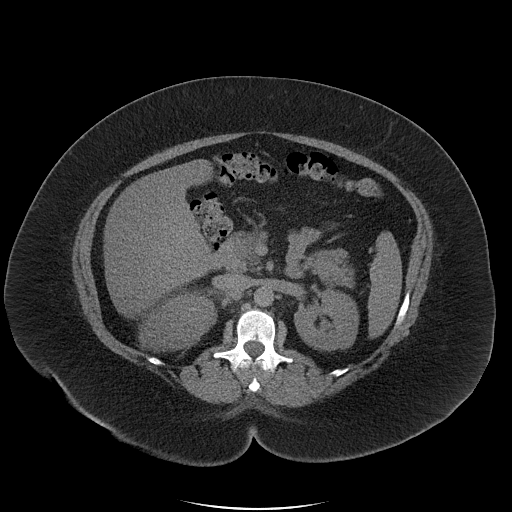
[im 61/93  bone]
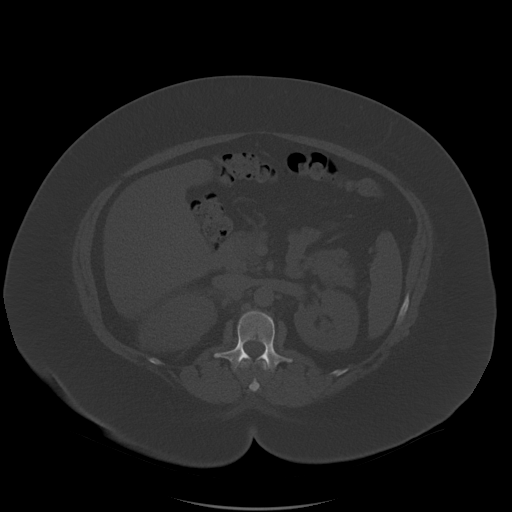
[im 69/93  soft-tissue]
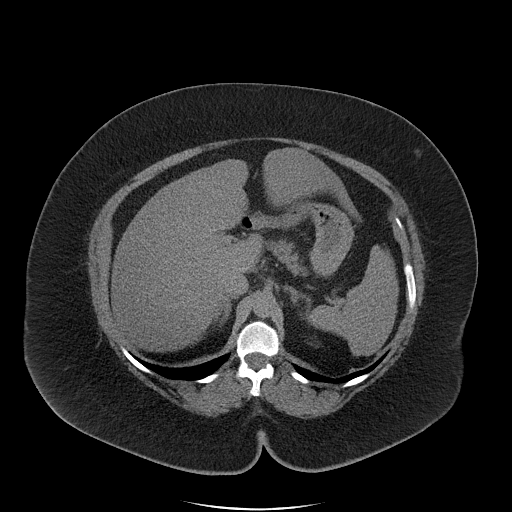
[im 73/93  soft-tissue]
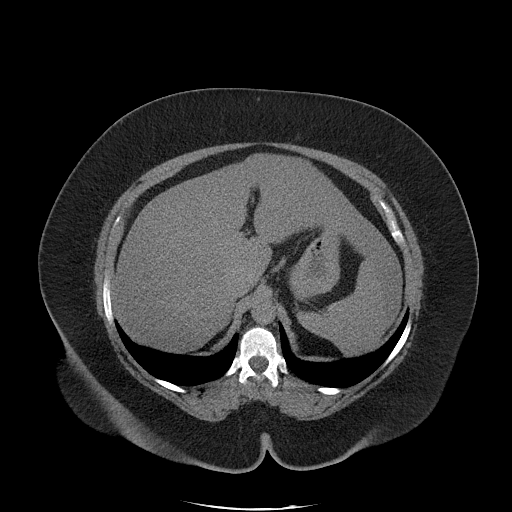
[im 81/93  soft-tissue]
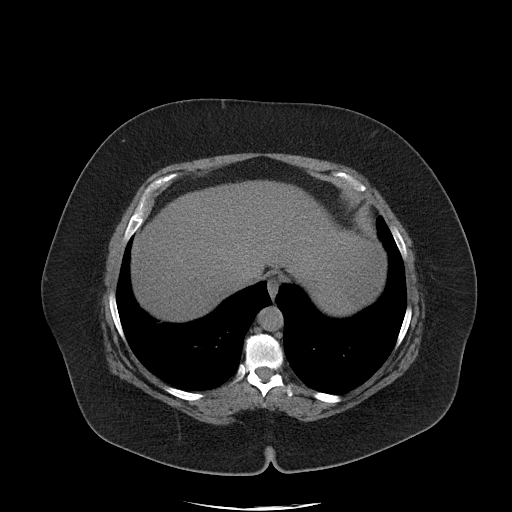
[im 89/93  soft-tissue]
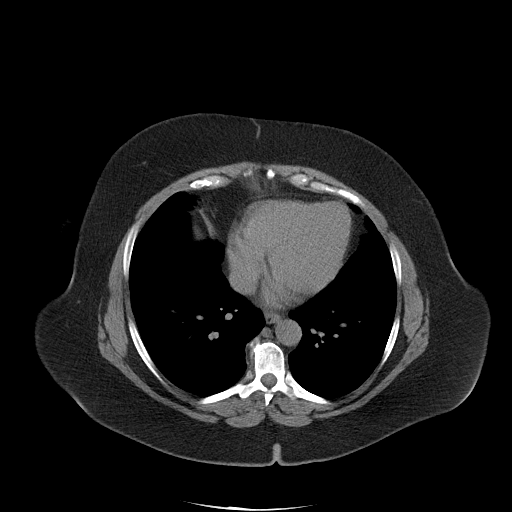

[Series 6: coronal · coronal · 0.92mm/px · 3 of 111 slices shown]
[im 37/111  soft-tissue]
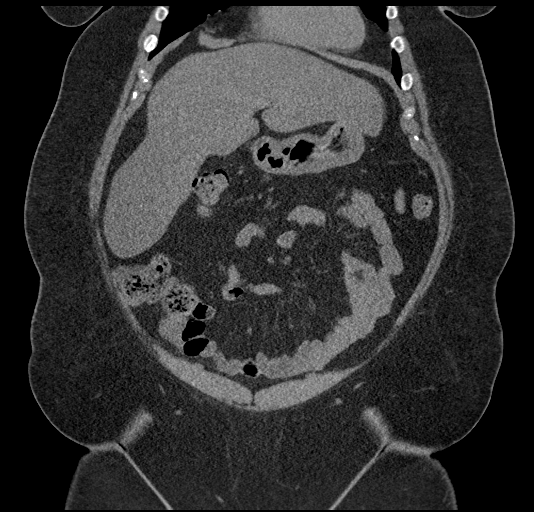
[im 49/111  soft-tissue]
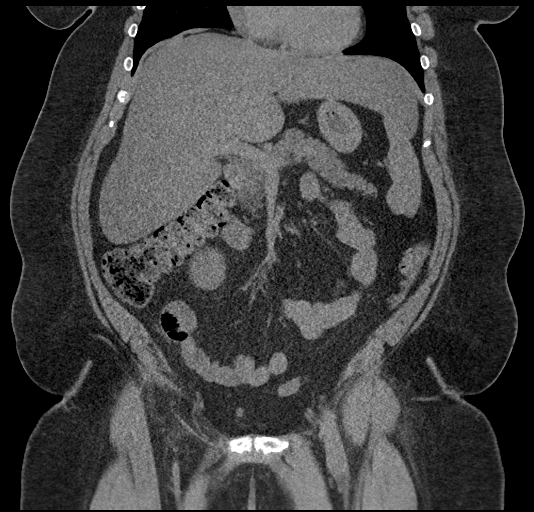
[im 62/111  soft-tissue]
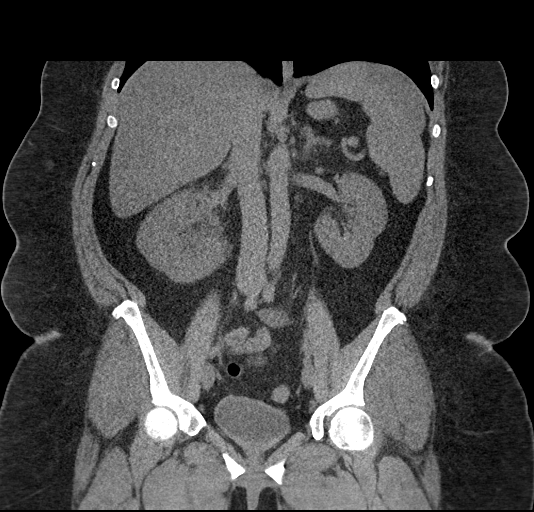

[16 of 46 positions shown; findings below may reference images not displayed]

FINDINGS: Lower chest: The visualized heart size within normal limits. No
pericardial fluid/thickening.

No hiatal hernia.

The visualized portions of the lungs are clear.

Hepatobiliary: Although limited due to the lack of intravenous
contrast, normal in appearance without gross focal abnormality. The
patient is status post cholecystectomy. No biliary ductal dilation.

Pancreas:  Unremarkable.  No surrounding inflammatory changes.

Spleen: Normal in size. Although limited due to the lack of
intravenous contrast, normal in appearance.

Adrenals/Urinary Tract: Both adrenal glands appear normal. There is
a 9 mm proximal right ureteral calculus causing mild right
pelvicaliectasis and proximal ureterectasis. There is moderate right
perinephric stranding. Punctate calcifications seen in the lower
pole the right kidney. There is also a punctate calcification in the
lower pole the left kidney. The bladder is unremarkable.

Stomach/Bowel: The stomach, small bowel, and colon are normal in
appearance. No inflammatory changes or obstructive findings.
appendix is normal.

Vascular/Lymphatic: There are no enlarged abdominal or pelvic lymph
nodes. No significant gross vascular findings are present.

Reproductive: The uterus and adnexa are unremarkable.

Other: No evidence of abdominal wall mass or hernia.

Musculoskeletal: No acute or significant osseous findings.
IMPRESSION: 9 mm proximal right ureteral calculus causing mild right
hydronephrosis with perinephric stranding.

Punctate bilateral nonobstructing renal calculi.

## 2021-08-11 ENCOUNTER — Ambulatory Visit: Payer: Self-pay | Admitting: Physician Assistant

## 2021-09-02 ENCOUNTER — Ambulatory Visit (INDEPENDENT_AMBULATORY_CARE_PROVIDER_SITE_OTHER): Payer: 59 | Admitting: Physician Assistant

## 2021-09-02 ENCOUNTER — Ambulatory Visit (INDEPENDENT_AMBULATORY_CARE_PROVIDER_SITE_OTHER): Payer: 59

## 2021-09-02 ENCOUNTER — Encounter: Payer: Self-pay | Admitting: Physician Assistant

## 2021-09-02 DIAGNOSIS — G8929 Other chronic pain: Secondary | ICD-10-CM

## 2021-09-02 DIAGNOSIS — M25552 Pain in left hip: Secondary | ICD-10-CM

## 2021-09-02 DIAGNOSIS — M25562 Pain in left knee: Secondary | ICD-10-CM

## 2021-09-02 MED ORDER — METHYLPREDNISOLONE ACETATE 40 MG/ML IJ SUSP
40.0000 mg | INTRAMUSCULAR | Status: AC | PRN
  Administered 2021-09-02: 40 mg via INTRA_ARTICULAR

## 2021-09-02 MED ORDER — LIDOCAINE HCL 1 % IJ SOLN
5.0000 mL | INTRAMUSCULAR | Status: AC | PRN
Start: 2021-09-02 — End: 2021-09-02
  Administered 2021-09-02: 5 mL

## 2021-09-02 NOTE — Progress Notes (Signed)
? ?Office Visit Note ?  ?Patient: Vanessa Bean           ?Date of Birth: 1975-12-24           ?MRN: [id number] ?Visit Date: 09/02/2021 ?             ?Requested by: Marva Panda, NP ?[address] ?[place],  [place] [address] ?PCP: Marva Panda, NP ? ?Chief Complaint  ?Patient presents with  ? Left Hip - Pain  ? Left Knee - Pain  ? ? ? ? ?HPI: ?The patient is a very pleasant 46 year old woman who has had a 110-month history of left leg pain.  She denies any injury.  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  She denies any instability.  She said the pain for started a little bit in her thigh but now is focal more in her knee where she is the most painful.  She also has noticed the last few days she has had some left groin pain.  No fever chills or other illness no paresthesias no weakness ? ?Assessment & Plan: ?Visit Diagnoses:  ?1. Pain in left hip   ?2. Chronic pain of left knee   ? ? ?Plan: Most of her findings and exam today seem to be consistent with a left knee pain.  She has tenderness with flexion on the medial side of the knee and the posterior knee.  Suspicious for injury to the medial meniscus.  She does have a little like arthritis in her left hip but does not seem to have too much pain with manipulation of the hip today.  She does not have any pain to deep palpation of the thigh.  We will start with a steroid injection today see if that helps her out this will be both therapeutic and diagnostic.  She will call up if this does not help her ? ?Follow-Up Instructions: No follow-ups on file.  ? ?Ortho Exam ? ?Patient is alert, oriented, no adenopathy, well-dressed, normal affect, normal respiratory effort. ?Examination of her left knee she has no effusion no redness no induration.  She has tenderness acutely over the medial compartment of the left knee.  This is increased with terminal flexion not much pain with terminal  extension good varus valgus stability.  She has mild pain with manipulation of her hip.  She has no pain with deep palpation of her thigh ? ?Imaging: ?No results found. ?No images are attached to the encounter. ? ?Labs: ?Lab Results  ?Component Value Date  ? REPTSTATUS 06/30/2020 FINAL 06/29/2020  ? CULT (A) 06/29/2020  ?  <10,000 COLONIES/mL INSIGNIFICANT GROWTH ?Performed at [facility], [address] [place], [place] [address] ?  ? LABORGA ESCHERICHIA COLI (A) 11/07/2019  ? ? ? ?Lab Results  ?Component Value Date  ? ALBUMIN 3.7 06/29/2020  ? ALBUMIN 3.1 (L) 11/08/2019  ? ALBUMIN 3.9 11/07/2019  ? ? ?No results found for: MG ?No results found for: VD25OH ? ?No results found for: PREALBUMIN ?CBC EXTENDED Latest Ref Rng & Units 07/08/2020 06/29/2020 11/16/2019  ?WBC 4.0 - 10.5 K/uL 9.3 10.2 9.1  ?RBC 3.87 - 5.11 MIL/uL 4.62 4.29 4.57  ?HGB 12.0 - 15.0 g/dL 67.3 41.9 37.9  ?HCT 36.0 - 46.0 % 43.6 40.2 43.2  ?PLT 150 - 400 K/uL 381 384 486(H)  ?NEUTROABS 1.7 - 7.7 K/uL - 6.3 -  ?LYMPHSABS 0.7 -  4.0 K/uL - 3.1 -  ? ? ? ?There is no height or weight on file to calculate BMI. ? ?Orders:  ?Orders Placed This Encounter  ?Procedures  ? XR HIP UNILAT W OR W/O PELVIS 2-3 VIEWS LEFT  ? XR KNEE 3 VIEW LEFT  ? ?No orders of the defined types were placed in this encounter. ? ? ? Procedures: ?Large Joint Inj: L knee on 09/02/2021 11:30 AM ?Indications: pain and diagnostic evaluation ?Details: 25 G 1.5 in needle, anteromedial approach ? ?Arthrogram: No ? ?Medications: 40 mg methylPREDNISolone acetate 40 MG/ML; 5 mL lidocaine 1 % ?Outcome: tolerated well, no immediate complications ?Procedure, treatment alternatives, risks and benefits explained, specific risks discussed. Consent was given by the patient.  ? ? ? ?Clinical Data: ?No additional findings. ? ?ROS: ? ?All other systems negative, except as noted in the HPI. ?Review of Systems ? ?Objective: ?Vital Signs: There were no vitals taken for this visit. ? ?Specialty Comments:   ?No specialty comments available. ? ?PMFS History: ?Patient Active Problem List  ? Diagnosis Date Noted  ? Sepsis due to urinary tract infection (HCC) 11/07/2019  ? ?Past Medical History:  ?Diagnosis Date  ? History of bronchitis   ? History of kidney stones   ? Hypertension   ?  ?History reviewed. No pertinent family history.  ?Past Surgical History:  ?Procedure Laterality Date  ? ANKLE FRACTURE SURGERY Right   ? CESAREAN SECTION    ? CHOLECYSTECTOMY    ? CYSTOSCOPY W/ RETROGRADES Right 11/07/2019  ? Procedure: CYSTOSCOPY RIGHT URETERAL STENT PLACEMENT WITH RETROGRADE PYELOGRAM;  Surgeon: Bjorn Pippin, MD;  Location: [facility];  Service: Urology;  Laterality: Right;  ? CYSTOSCOPY/URETEROSCOPY/HOLMIUM LASER/STENT PLACEMENT Right 11/23/2019  ? Procedure: RIGHT URETEROSCOPY//STENT EXCHANGE;  Surgeon: Bjorn Pippin, MD;  Location: [facility];  Service: Urology;  Laterality: Right;  ? DILATION AND CURETTAGE OF UTERUS    ? ?Social History  ? ?Occupational History  ? Not on file  ?Tobacco Use  ? Smoking status: Former  ?  Types: Cigarettes  ?  Quit date: 2015  ?  Years since quitting: 8.1  ? Smokeless tobacco: Never  ?Vaping Use  ? Vaping Use: Never used  ?Substance and Sexual Activity  ? Alcohol use: Yes  ?  Comment: occas.  ? Drug use: No  ? Sexual activity: Yes  ? ? ? ? ? ?

## 2021-11-17 ENCOUNTER — Encounter: Payer: Self-pay | Admitting: Nurse Practitioner

## 2021-11-17 ENCOUNTER — Ambulatory Visit (INDEPENDENT_AMBULATORY_CARE_PROVIDER_SITE_OTHER): Payer: 59 | Admitting: Nurse Practitioner

## 2021-11-17 VITALS — BP 128/78 | HR 96 | Temp 98.2°F | Ht 64.0 in | Wt 312.2 lb

## 2021-11-17 DIAGNOSIS — R002 Palpitations: Secondary | ICD-10-CM | POA: Diagnosis not present

## 2021-11-17 DIAGNOSIS — Z0000 Encounter for general adult medical examination without abnormal findings: Secondary | ICD-10-CM | POA: Diagnosis not present

## 2021-11-17 DIAGNOSIS — R1903 Right lower quadrant abdominal swelling, mass and lump: Secondary | ICD-10-CM

## 2021-11-17 DIAGNOSIS — Z6841 Body Mass Index (BMI) 40.0 and over, adult: Secondary | ICD-10-CM

## 2021-11-17 DIAGNOSIS — N920 Excessive and frequent menstruation with regular cycle: Secondary | ICD-10-CM

## 2021-11-17 DIAGNOSIS — E66813 Obesity, class 3: Secondary | ICD-10-CM | POA: Insufficient documentation

## 2021-11-17 DIAGNOSIS — I10 Essential (primary) hypertension: Secondary | ICD-10-CM | POA: Insufficient documentation

## 2021-11-17 DIAGNOSIS — F320 Major depressive disorder, single episode, mild: Secondary | ICD-10-CM | POA: Insufficient documentation

## 2021-11-17 LAB — CBC
HCT: 42.5 % (ref 36.0–46.0)
Hemoglobin: 14.2 g/dL (ref 12.0–15.0)
MCHC: 33.4 g/dL (ref 30.0–36.0)
MCV: 96.4 fl (ref 78.0–100.0)
Platelets: 354 10*3/uL (ref 150.0–400.0)
RBC: 4.41 Mil/uL (ref 3.87–5.11)
RDW: 13.5 % (ref 11.5–15.5)
WBC: 10.2 10*3/uL (ref 4.0–10.5)

## 2021-11-17 LAB — COMPREHENSIVE METABOLIC PANEL
ALT: 77 U/L — ABNORMAL HIGH (ref 0–35)
AST: 49 U/L — ABNORMAL HIGH (ref 0–37)
Albumin: 4.4 g/dL (ref 3.5–5.2)
Alkaline Phosphatase: 74 U/L (ref 39–117)
BUN: 13 mg/dL (ref 6–23)
CO2: 29 mEq/L (ref 19–32)
Calcium: 10.3 mg/dL (ref 8.4–10.5)
Chloride: 101 mEq/L (ref 96–112)
Creatinine, Ser: 0.6 mg/dL (ref 0.40–1.20)
GFR: 109.57 mL/min (ref >60.00)
Glucose, Bld: 92 mg/dL (ref 70–99)
Potassium: 4 mEq/L (ref 3.5–5.1)
Sodium: 138 mEq/L (ref 135–145)
Total Bilirubin: 0.6 mg/dL (ref 0.2–1.2)
Total Protein: 7.1 g/dL (ref 6.0–8.3)

## 2021-11-17 LAB — MICROALBUMIN / CREATININE URINE RATIO
Creatinine,U: 22.2 mg/dL
Microalb Creat Ratio: 3.2 mg/g (ref 0.0–30.0)
Microalb, Ur: <0.7 mg/dL (ref 0.0–1.9)

## 2021-11-17 LAB — LIPID PANEL
Cholesterol: 194 mg/dL (ref 0–200)
HDL: 47.1 mg/dL (ref >39.00)
LDL Cholesterol: 120 mg/dL — ABNORMAL HIGH (ref 0–99)
NonHDL: 146.69
Total CHOL/HDL Ratio: 4
Triglycerides: 131 mg/dL (ref 0.0–149.0)
VLDL: 26.2 mg/dL (ref 0.0–40.0)

## 2021-11-17 LAB — HEMOGLOBIN A1C: Hgb A1c MFr Bld: 6 % (ref 4.6–6.5)

## 2021-11-17 LAB — TSH: TSH: 2.42 u[IU]/mL (ref 0.35–5.50)

## 2021-11-17 MED ORDER — TRIAMTERENE-HCTZ 37.5-25 MG PO TABS: 1.0000 | ORAL_TABLET | Freq: Every day | ORAL | 1 refills | Status: DC

## 2021-11-17 MED ORDER — DULOXETINE HCL 30 MG PO CPEP: 30.0000 mg | ORAL_CAPSULE | Freq: Every day | ORAL | 2 refills | Status: DC

## 2021-11-17 MED ORDER — OLMESARTAN MEDOXOMIL 20 MG PO TABS: 20.0000 mg | ORAL_TABLET | Freq: Every day | ORAL | 1 refills | Status: DC

## 2021-11-17 NOTE — Assessment & Plan Note (Signed)
Palpated on right lower lateral abdominal wall.  Size of golf ball nontender not mobile.  Pending ultrasound of the abdomen.  Patient was given facility name address phone number and instructed to call to set up ultrasound at her convenience

## 2021-11-17 NOTE — Assessment & Plan Note (Signed)
Patient currently maintained on Maxide and on olmelsartan.  Patient's blood pressure within normal limits today.  Continue medication as prescribed.  Patient states when she checks blood pressures at home it seems elevated we did discuss the difference between her cuff and our cuff likely cuff size is playing a role.  She is more than welcome to bring her machine in when she has appointments for [place] to compare the 2 readings.

## 2021-11-17 NOTE — Assessment & Plan Note (Signed)
EKG in office within normal limits.  Pending labs can always offer patient cardiology follow-up if needed could be multifactorial if metabolic is ruled out can think about anxiety as a provocative patient

## 2021-11-17 NOTE — Progress Notes (Signed)
New Patient Office Visit  Subjective    Patient ID: Vanessa Bean, female    DOB: [birth year]  Age: 46 y.o. MRN: [id number]  CC:  Chief Complaint  Patient presents with   Establish Care    HPI Vanessa Bean presents to establish care   HTN: will check BP twice a day. She has an app that checks her BP. States that her pulse will go up when she does the smallest of activity. Has been getting 144/99, 155/ over 90s  States that for the past 2 weeks she has felt that she was going to pass out and felt dizzy. States that it has stopped. Over this past week been good.  Decribed light headed.  Diet: 3 meals and with snacks. Water with flavoring and will have ginger ale tha tis caffiene free and diet. Some coffee  She will walk  a couple times a week at 10-15 minutes at a time  Not sure of last CPE.  Last PAP was 9 -10 years  Anxeity and depression: states that shewas on cymbalta in the past that helped with her ankle pain and her mood. No si/Hi/AVH  Outpatient Encounter Medications as of 11/17/2021  Medication Sig   DULoxetine (CYMBALTA) 30 MG capsule Take 1 capsule (30 mg total) by mouth daily.   Multiple Vitamin (MULTIVITAMIN WITH MINERALS) TABS tablet Take 1 tablet by mouth daily.   [DISCONTINUED] olmesartan (BENICAR) 20 MG tablet Take 20 mg by mouth daily.   [DISCONTINUED] triamterene-hydrochlorothiazide (MAXZIDE-25) 37.5-25 MG tablet Take 1 tablet by mouth daily.   fluticasone (FLONASE) 50 MCG/ACT nasal spray Place 1 spray into both nostrils daily.   olmesartan (BENICAR) 20 MG tablet Take 1 tablet (20 mg total) by mouth daily.   ondansetron (ZOFRAN ODT) 4 MG disintegrating tablet Take 1 tablet (4 mg total) by mouth every 8 (eight) hours as needed for nausea or vomiting.   oxyCODONE-acetaminophen (PERCOCET/ROXICET) 5-325 MG tablet Take 1-2 tablets by mouth every 6 (six) hours as needed for severe pain.   triamterene-hydrochlorothiazide (MAXZIDE-25) 37.5-25 MG tablet Take 1 tablet  by mouth daily.   No facility-administered encounter medications on file as of 11/17/2021.    Past Medical History:  Diagnosis Date   History of bronchitis    History of kidney stones    Hypertension     Past Surgical History:  Procedure Laterality Date   ANKLE FRACTURE SURGERY Right    CESAREAN SECTION     CHOLECYSTECTOMY     CYSTOSCOPY W/ RETROGRADES Right 11/07/2019   Procedure: CYSTOSCOPY RIGHT URETERAL STENT PLACEMENT WITH RETROGRADE PYELOGRAM;  Surgeon: Bjorn Pippin, MD;  Location: [facility];  Service: Urology;  Laterality: Right;   CYSTOSCOPY/URETEROSCOPY/HOLMIUM LASER/STENT PLACEMENT Right 11/23/2019   Procedure: RIGHT URETEROSCOPY//STENT EXCHANGE;  Surgeon: Bjorn Pippin, MD;  Location: [facility];  Service: Urology;  Laterality: Right;   DILATION AND CURETTAGE OF UTERUS      Family History  Problem Relation Age of Onset   Heart disease Mother        CHF   Hypertension Mother    Diabetes Mother    Alcohol abuse Father    Schizophrenia Father    Bipolar disorder Father    Diabetes Maternal Grandmother    Cancer Maternal Grandmother        Breast cancer    Social History   Socioeconomic History   Marital status: Married    Spouse name: Not on file   Number of children: 1   Years of  education: Not on file   Highest education level: Not on file  Occupational History   Not on file  Tobacco Use   Smoking status: Former    Types: Cigarettes    Quit date: 2015    Years since quitting: 8.3   Smokeless tobacco: Never  Vaping Use   Vaping Use: Never used  Substance and Sexual Activity   Alcohol use: Yes    Comment: Twice a month with all 3  2-3   Drug use: No   Sexual activity: Yes  Other Topics Concern   Not on file  Social History Narrative   Part time: cleans homes   Social Determinants of Health   Financial Resource Strain: Not on file  Food Insecurity: Not on file  Transportation Needs: Not on file  Physical Activity: Not on file  Stress: Not on file   Social Connections: Not on file  Intimate Partner Violence: Not on file    Review of Systems  Constitutional:  Positive for malaise/fatigue. Negative for chills and fever.  Respiratory:  Shortness of breath: doe.   Cardiovascular:  Positive for palpitations. Negative for chest pain and leg swelling.  Gastrointestinal:  Negative for abdominal pain, diarrhea, nausea and vomiting.       Bm daily    Genitourinary:  Negative for dysuria and hematuria.  Neurological:  Positive for dizziness (lightheadedness) and tingling (ankle). Negative for headaches.  Psychiatric/Behavioral:  Positive for depression. Negative for hallucinations and suicidal ideas.        Objective    BP 128/78 (BP Location: Left Arm, Patient Position: Sitting)   Pulse 96   Temp 98.2 F (36.8 C) (Oral)   Ht 5\' 4"  (1.626 m)   Wt (!) 312 lb 4 oz (141.6 kg)   LMP 10/24/2021   PF 98 L/min   BMI 53.60 kg/m   Physical Exam Vitals and nursing note reviewed.  Constitutional:      Appearance: Normal appearance. She is obese.  HENT:     Right Ear: Ear canal and external ear normal.     Left Ear: Tympanic membrane, ear canal and external ear normal.     Mouth/Throat:     Mouth: Mucous membranes are moist.     Pharynx: Oropharynx is clear.  Eyes:     Extraocular Movements: Extraocular movements intact.     Pupils: Pupils are equal, round, and reactive to light.  Cardiovascular:     Rate and Rhythm: Normal rate. Rhythm irregular.     Heart sounds: Normal heart sounds.  Pulmonary:     Effort: Pulmonary effort is normal.     Breath sounds: Normal breath sounds.  Abdominal:     General: Bowel sounds are normal.     Palpations: Abdomen is soft. There is mass.       Comments: Size of a golf ball. No mobile. Non tender  Musculoskeletal:     Right lower leg: No edema.     Left lower leg: No edema.  Lymphadenopathy:     Cervical: No cervical adenopathy.  Skin:    General: Skin is warm.  Neurological:      General: No focal deficit present.     Mental Status: She is alert.  Psychiatric:        Mood and Affect: Mood is depressed.        Behavior: Behavior normal.        Thought Content: Thought content normal.        Judgment: Judgment normal.  Assessment & Plan:   Problem List Items Addressed This Visit       Cardiovascular and Mediastinum   Primary hypertension    Patient currently maintained on Maxide and on olmelsartan.  Patient's blood pressure within normal limits today.  Continue medication as prescribed.  Patient states when she checks blood pressures at home it seems elevated we did discuss the difference between her cuff and our cuff likely cuff size is playing a role.  She is more than welcome to bring her machine in when she has appointments for us to compare the 2 readings.       Relevant Medications   triamterene-hydrochlorothiazide (MAXZIDE-25) 37.5-25 MG tablet   olmesartan (BENICAR) 20 MG tablet   Other Relevant Orders   Microalbumin / creatinine urine ratio     Other   Menorrhagia with regular cycle    Has been extended period time since patient has had a Pap smear.  She did mention that she has regular painful cycles that induced large blood clots.  Will do ambulatory referral to GYN for them to investigate cycle discrepancies and start maintain patient's Pap smears       Relevant Orders   Ambulatory referral to Gynecology   Current mild episode of major depressive disorder without prior episode (HCC)    PHQ-9 and GAD-7 administered in office.  Patient denies HI/SI/AVH.  Patient did well on duloxetine in the past.  We will start patient back on duloxetine 30 mg nightly.  Common side effects reviewed with patient follow-up in 8 weeks       Relevant Medications   DULoxetine (CYMBALTA) 30 MG capsule   Palpitations    EKG in office within normal limits.  Pending labs can always offer patient cardiology follow-up if needed could be multifactorial if  metabolic is ruled out can think about anxiety as a provocative patient       Relevant Orders   TSH   EKG 12-Lead (Completed)   Class 3 severe obesity due to excess calories with serious comorbidity and body mass index (BMI) of 50.0 to 59.9 in adult [facility](HCC)    Patient having difficult time with exercise and motivation due to underlying depression it seems.  Continue working on healthy lifestyle modifications       Encounter for medical examination to establish care   Relevant Orders   CBC   Comprehensive metabolic panel   Hemoglobin A1c   TSH   Lipid panel   Ambulatory referral to Gynecology   Right lower quadrant abdominal mass - Primary    Palpated on right lower lateral abdominal wall.  Size of golf ball nontender not mobile.  Pending ultrasound of the abdomen.  Patient was given facility name address phone number and instructed to call to set up ultrasound at her convenience       Relevant Orders   US Abdomen Complete    Return in about 2 months (around 01/17/2022) for Recheck on depression.   Audria NineMatt Brita Jurgensen, NP

## 2021-11-17 NOTE — Assessment & Plan Note (Signed)
Patient having difficult time with exercise and motivation due to underlying depression it seems.  Continue working on healthy lifestyle modifications

## 2021-11-17 NOTE — Assessment & Plan Note (Signed)
PHQ-9 and GAD-7 administered in office.  Patient denies HI/SI/AVH.  Patient did well on duloxetine in the past.  We will start patient back on duloxetine 30 mg nightly.  Common side effects reviewed with patient follow-up in 8 weeks

## 2021-11-17 NOTE — Patient Instructions (Addendum)
Nice to see you today I will be in touch with the lab work  Follow up with me in 8 weeks to see how you are doing with the medication, follow up sooner if you need me

## 2021-11-17 NOTE — Assessment & Plan Note (Signed)
Has been extended period time since patient has had a Pap smear.  She did mention that she has regular painful cycles that induced large blood clots.  Will do ambulatory referral to GYN for them to investigate cycle discrepancies and start maintain patient's Pap smears

## 2021-11-18 ENCOUNTER — Encounter: Payer: Self-pay | Admitting: Nurse Practitioner

## 2021-11-18 MED ORDER — WEGOVY 0.25 MG/0.5ML ~~LOC~~ SOAJ: 0.2500 mg | SUBCUTANEOUS | 0 refills | Status: DC

## 2021-11-18 NOTE — Telephone Encounter (Signed)
I called and spoke with patient and she does not have personal or FH of Medullary thyroid cancer, MENS type 2 or pancreatitis

## 2021-12-10 ENCOUNTER — Encounter: Payer: Self-pay | Admitting: *Deleted

## 2021-12-22 ENCOUNTER — Other Ambulatory Visit: Payer: Self-pay | Admitting: Nurse Practitioner

## 2021-12-22 MED ORDER — SAXENDA 18 MG/3ML ~~LOC~~ SOPN: PEN_INJECTOR | SUBCUTANEOUS | 0 refills | Status: DC

## 2021-12-22 NOTE — Progress Notes (Signed)
Orders only for saxenda as wegovy was not covered by insurance

## 2022-01-01 ENCOUNTER — Telehealth: Payer: Self-pay

## 2022-01-01 NOTE — Telephone Encounter (Signed)
Prior auth started for Saxenda 18MG /3ML pen-injectors. [address] ([id number]) Rx #: [id number]  Prior [id number] has been denied.    Product not covered by this plan. Prior Authorization not available., Closed Rationale: [facility] Prior Authorization team is unable to review this product for a coverage determination as the requested medication is not on the patient's formulary.

## 2022-01-07 ENCOUNTER — Other Ambulatory Visit: Payer: Self-pay | Admitting: Nurse Practitioner

## 2022-01-07 DIAGNOSIS — R7989 Other specified abnormal findings of blood chemistry: Secondary | ICD-10-CM

## 2022-01-07 DIAGNOSIS — R1903 Right lower quadrant abdominal swelling, mass and lump: Secondary | ICD-10-CM

## 2022-01-09 ENCOUNTER — Ambulatory Visit: Admission: RE | Admit: 2022-01-09 | Payer: 59 | Source: Ambulatory Visit

## 2022-01-14 ENCOUNTER — Ambulatory Visit (INDEPENDENT_AMBULATORY_CARE_PROVIDER_SITE_OTHER): Payer: 59 | Admitting: Nurse Practitioner

## 2022-01-14 ENCOUNTER — Encounter: Payer: Self-pay | Admitting: Nurse Practitioner

## 2022-01-14 VITALS — BP 110/76 | HR 93 | Temp 98.0°F | Resp 14 | Ht 64.0 in | Wt 299.5 lb

## 2022-01-14 DIAGNOSIS — F320 Major depressive disorder, single episode, mild: Secondary | ICD-10-CM | POA: Diagnosis not present

## 2022-01-14 DIAGNOSIS — I10 Essential (primary) hypertension: Secondary | ICD-10-CM

## 2022-01-14 DIAGNOSIS — M25562 Pain in left knee: Secondary | ICD-10-CM | POA: Diagnosis not present

## 2022-01-14 MED ORDER — DULOXETINE HCL 60 MG PO CPEP: 60.0000 mg | ORAL_CAPSULE | Freq: Every day | ORAL | 0 refills | Status: DC

## 2022-01-14 NOTE — Assessment & Plan Note (Signed)
Has been managed in the past with orthopedist injections.  Patient states she is unable to follow-up with orthopedics currently financially as it costs absorbent amount of money for the office visit and injection.  We will try over-the-counter Voltaren gel if it is beneficial she can contact the office and I will write a prescription also do over-the-counter ibuprofen with parameters of taking it no longer than a week at a time but she can use intermittently.  Also information about her sports medicine physician Dr. Karleen Hampshire Copland

## 2022-01-14 NOTE — Assessment & Plan Note (Signed)
Within normal limits today.  Patient to bring in blood pressure cuff and found to be 20-30 points higher than our cuff.

## 2022-01-14 NOTE — Assessment & Plan Note (Signed)
PHQ-9 GAD-7 administered today.  Patient denies HI/SI/AVH.  States she felt like it was working and then kind of plateaued unsure of the dose that she was on in the past did have side effects when starting medication.  We will titrate dose from Cymbalta 30 mg to Cymbalta 60 mg.  Did asked patient she may experience some side effects again with dose increase.

## 2022-01-14 NOTE — Progress Notes (Signed)
Established Patient Office Visit  Subjective   Patient ID: Vanessa Bean, female    DOB: 26-Oct-1975  Age: 46 y.o. MRN: [id number]  Chief Complaint  Patient presents with   Depression    Follow up      Depression: States that the first week she had side.  Effects. States that she was having extreme nausea, tired and weak and headache. No hi/Si/AVH. Has not done therapy in the past, she is open to the idea but currently dealing with changing insurance policies.  Did administer PHQ-9 and GAD-7 in office today.  Patient states she has been having some environmental external factors affecting her as of late and feels like this is contributing to her increased scores.  States that she feels like the Cymbalta started working and then plateaued.  States it has helped slightly with her ankle pain  HTN: Patient did bring in her blood pressure cuff to office today we did compare our readings to hers hers was approximately 20-30 points higher than our readings.  Knee pain: States she has seen orthopedist in the past for left knee and required injections.  States currently with her policy she is not able to go to specialists and have the injection.  Did recommend trying over-the-counter Voltaren gel gave parameters in regards to using ibuprofen patient can follow-up with sports medicine office if needed     01/14/2022    8:27 AM 11/17/2021   12:43 PM  PHQ9 SCORE ONLY  PHQ-9 Total Score 19 15      01/14/2022    8:28 AM 11/17/2021   12:45 PM  GAD 7 : Generalized Anxiety Score  Nervous, Anxious, on Edge 2 2  Control/stop worrying 3 3  Worry too much - different things 3 3  Trouble relaxing 2 1  Restless 1 0  Easily annoyed or irritable 2 2  Afraid - awful might happen 3 3  Total GAD 7 Score 16 14  Anxiety Difficulty Very difficult          Review of Systems  Constitutional:  Negative for chills and fever.  Respiratory:  Negative for shortness of breath.   Cardiovascular:  Negative for  chest pain.  Gastrointestinal:  Negative for abdominal pain, constipation, diarrhea and nausea.  Neurological:  Negative for dizziness and headaches.  Psychiatric/Behavioral:  Positive for depression.       Objective:     BP 110/76   Pulse 93   Temp 98 F (36.7 C)   Resp 14   Ht 5\' 4"  (1.626 m)   Wt 299 lb 8 oz (135.9 kg)   SpO2 99%   BMI 51.41 kg/m  BP Readings from Last 3 Encounters:  01/14/22 110/76  11/17/21 128/78  07/17/20 (!) 184/131   Wt Readings from Last 3 Encounters:  01/14/22 299 lb 8 oz (135.9 kg)  11/17/21 (!) 312 lb 4 oz (141.6 kg)  07/17/20 286 lb 12.8 oz (130.1 kg)      Physical Exam Vitals and nursing note reviewed.  Constitutional:      Appearance: Normal appearance. She is obese.  Cardiovascular:     Rate and Rhythm: Normal rate and regular rhythm.     Heart sounds: Normal heart sounds.  Pulmonary:     Effort: Pulmonary effort is normal.     Breath sounds: Normal breath sounds.  Abdominal:     General: Bowel sounds are normal.  Neurological:     Mental Status: She is alert.  Psychiatric:  Mood and Affect: Mood normal.        Behavior: Behavior normal.        Thought Content: Thought content normal.        Judgment: Judgment normal.      No results found for any visits on 01/14/22.    The 10-year ASCVD risk score (Arnett DK, et al., 2019) is: 0.9%    Assessment & Plan:   Problem List Items Addressed This Visit       Cardiovascular and Mediastinum   Primary hypertension    Within normal limits today.  Patient to bring in blood pressure cuff and found to be 20-30 points higher than our cuff.        Other   Current mild episode of major depressive disorder without prior episode (HCC) - Primary    PHQ-9 GAD-7 administered today.  Patient denies HI/SI/AVH.  States she felt like it was working and then kind of plateaued unsure of the dose that she was on in the past did have side effects when starting medication.  We will  titrate dose from Cymbalta 30 mg to Cymbalta 60 mg.  Did asked patient she may experience some side effects again with dose increase.      Relevant Medications   DULoxetine (CYMBALTA) 60 MG capsule   Left knee pain    Has been managed in the past with orthopedist injections.  Patient states she is unable to follow-up with orthopedics currently financially as it costs absorbent amount of money for the office visit and injection.  We will try over-the-counter Voltaren gel if it is beneficial she can contact the office and I will write a prescription also do over-the-counter ibuprofen with parameters of taking it no longer than a week at a time but she can use intermittently.  Also information about her sports medicine physician Dr. Karleen Hampshire Copland       Return in about 2 months (around 03/17/2022) for Virtual follow up on MDD and medication .    Audria Nine, NP

## 2022-01-14 NOTE — Patient Instructions (Signed)
Nice to see you today You can try over the counter voltaren gel on your knee We also have a doctor her that deals with joints and does injections. His name is Dr. Karleen Hampshire Copland.  You can also take ibuprofen as needed Follow up with me virtually in 8 weeks

## 2022-01-19 ENCOUNTER — Ambulatory Visit: Payer: 59 | Admitting: Nurse Practitioner

## 2022-02-17 ENCOUNTER — Encounter: Payer: Self-pay | Admitting: Nurse Practitioner

## 2022-02-17 DIAGNOSIS — F320 Major depressive disorder, single episode, mild: Secondary | ICD-10-CM

## 2022-02-18 MED ORDER — VENLAFAXINE HCL ER 37.5 MG PO CP24: 37.5000 mg | ORAL_CAPSULE | Freq: Every day | ORAL | 0 refills | Status: DC

## 2022-02-18 MED ORDER — DULOXETINE HCL 30 MG PO CPEP: 30.0000 mg | ORAL_CAPSULE | Freq: Every day | ORAL | 0 refills | Status: DC

## 2022-02-18 NOTE — Telephone Encounter (Signed)
Patient is currently on Cymbalta 60 and states she is bruising more. We can switch her to effexor to see if that helps. We can wean her down on the cymbalta and take 30mg  for a week then switch to effexor 37.5 mg. If she tolerates that well we can titrate the dose up but want to see her in office to make sure the burising is doing better.  I will send in a week of Cymbalta 30mg  THEN she will switch to effexor 37.5 mg and have an appointment with me in approx 1 month.  STOP the Cymbalta 60mg  all together

## 2022-03-14 ENCOUNTER — Other Ambulatory Visit: Payer: Self-pay | Admitting: Nurse Practitioner

## 2022-03-14 DIAGNOSIS — F320 Major depressive disorder, single episode, mild: Secondary | ICD-10-CM

## 2022-03-18 NOTE — Telephone Encounter (Signed)
Spoke with patient. Still has highs and lows and emotionally worse since she went to Effexor. She found happy medium with the higher dose Duloxetine but with the abnormal bruising the medication change was made. Abnormal bruising resolved now. No side effects from Effexor. Not sure if she should increase her dose for better control?

## 2022-03-18 NOTE — Telephone Encounter (Signed)
Has patient completed the duloxetine and is now taking Effexor 37.5 mg daily?  How is the transition going?

## 2022-03-18 NOTE — Telephone Encounter (Signed)
Left message to call back to discuss.

## 2022-03-20 NOTE — Telephone Encounter (Signed)
Unable to reach pt by phone after 2 attempts; I spoke with pts husband (DPR signed) he said that pt was probably asleep and Dellis Filbert notified as instructed and he voiced understanding and Dellis Filbert would let pt know. Dellis Filbert said to the best of his knowledge no SI/HI. Dellis Filbert said pt would keep VV with Romilda Garret NP on 03/23/22 as scheduled. Sending note to Romilda Garret NP and Organ CMA as Juluis Rainier.

## 2022-03-20 NOTE — Telephone Encounter (Signed)
I think we should increase effexor to 75 mg.  If she has 37.5 mg left she can take two tablets once daily, but I am also sending in thirty day supply of 75 mg increased dose. Have her maintain her appt with matt in two days. I imagine the drastic change in dosage form cymbalta to effexor might have made it a tougher transition, so we will titrate up slowly.   Please Vanessa Bean ensure no SI or HI?

## 2022-03-21 ENCOUNTER — Encounter: Payer: Self-pay | Admitting: Nurse Practitioner

## 2022-03-23 ENCOUNTER — Telehealth (INDEPENDENT_AMBULATORY_CARE_PROVIDER_SITE_OTHER): Payer: Self-pay | Admitting: Nurse Practitioner

## 2022-03-23 ENCOUNTER — Telehealth: Payer: Self-pay | Admitting: Nurse Practitioner

## 2022-03-23 VITALS — HR 98 | Temp 99.5°F

## 2022-03-23 DIAGNOSIS — J069 Acute upper respiratory infection, unspecified: Secondary | ICD-10-CM

## 2022-03-23 DIAGNOSIS — F320 Major depressive disorder, single episode, mild: Secondary | ICD-10-CM

## 2022-03-23 DIAGNOSIS — R0602 Shortness of breath: Secondary | ICD-10-CM | POA: Insufficient documentation

## 2022-03-23 MED ORDER — AMOXICILLIN-POT CLAVULANATE 875-125 MG PO TABS: 1.0000 | ORAL_TABLET | Freq: Two times a day (BID) | ORAL | 0 refills | Status: AC

## 2022-03-23 MED ORDER — VENLAFAXINE HCL ER 75 MG PO CP24: 75.0000 mg | ORAL_CAPSULE | Freq: Every day | ORAL | 0 refills | Status: DC

## 2022-03-23 MED ORDER — PREDNISONE 20 MG PO TABS: ORAL_TABLET | ORAL | 0 refills | Status: AC

## 2022-03-23 NOTE — Assessment & Plan Note (Signed)
Has transition from duloxetine and the bruising has resolved.  Patient has had a difficult time with a lower dose of venlafaxine.  She was titrated up to 75 mg daily approximately 3 days ago.  We will continue that for 4 weeks with virtual follow-up.

## 2022-03-23 NOTE — Assessment & Plan Note (Signed)
Patient endorsed shortness of breath and did look slightly dyspneic on video.  We will go ahead and treat her with prednisone 20 mg taper.  Precautions reviewed when to be seen in office, urgently or emergently.  Follow-up if no improvement

## 2022-03-23 NOTE — Progress Notes (Signed)
Patient ID: Vanessa Bean, female    DOB: 1975/10/16, 46 y.o.   MRN: [id number]  Virtual visit completed through [employer], a video enabled telemedicine application. Due to national recommendations of social distancing due to COVID-19, a virtual visit is felt to be most appropriate for this patient at this time. Reviewed limitations, risks, security and privacy concerns of performing a virtual visit and the availability of in person appointments. I also reviewed that there may be a patient responsible charge related to this service. The patient agreed to proceed.   Patient location: home Provider location: [place] [facility], office Persons participating in this virtual visit: patient, provider   If any vitals were documented, they were collected by patient at home unless specified below.    Pulse 98 Comment: per patient today  Temp 99.5 F (37.5 C) Comment: per patient yesterday  LMP 02/27/2022   SpO2 99%    CC: Depression  Subjective:   HPI: Vanessa Bean is a 46 y.o. female presenting on 03/23/2022 for Depression (Follow up) and Cough (Sx on 03/14/22- started Started with sinus pressure and nasal congestion, starting to settle in her chest, cough, low grade fever.)  Depression: States that she was on duloxetine and started having the bruises. We did switch her to the new medication. States that she felt worse then she did before. Having mood swings, irritable,   URI: symptoms started around 03/14/2022. ZStates that she thought it was her allergies. States that it has progressively gotten worse. Slight fever, dry hacky cough and feels like it is settling in her chest. States that her daughter in law is sick.  No covid  test Has had the pfizer vaccine x2. Has been using dayquill and nyquill for htn. States that it started to help. States Saturday       03/23/2022   11:20 AM 01/14/2022    8:27 AM 11/17/2021   12:43 PM  PHQ9 SCORE ONLY  PHQ-9 Total Score 22 19 15         03/23/2022   11:21 AM 01/14/2022    8:28 AM 11/17/2021   12:45 PM  GAD 7 : Generalized Anxiety Score  Nervous, Anxious, on Edge 1 2 2   Control/stop worrying 3 3 3   Worry too much - different things 3 3 3   Trouble relaxing 3 2 1   Restless 1 1 0  Easily annoyed or irritable 3 2 2   Afraid - awful might happen 1 3 3   Total GAD 7 Score 15 16 14   Anxiety Difficulty  Very difficult          Relevant past medical, surgical, family and social history reviewed and updated as indicated. Interim medical history since our last visit reviewed. Allergies and medications reviewed and updated. Outpatient Medications Prior to Visit  Medication Sig Dispense Refill   Multiple Vitamin (MULTIVITAMIN WITH MINERALS) TABS tablet Take 1 tablet by mouth daily.     olmesartan (BENICAR) 20 MG tablet Take 1 tablet (20 mg total) by mouth daily. 90 tablet 1   triamterene-hydrochlorothiazide (MAXZIDE-25) 37.5-25 MG tablet Take 1 tablet by mouth daily. 90 tablet 1   venlafaxine XR (EFFEXOR XR) 75 MG 24 hr capsule Take 1 capsule (75 mg total) by mouth daily with breakfast. 30 capsule 0   No facility-administered medications prior to visit.     Per HPI unless specifically indicated in ROS section below Review of Systems  Constitutional:  Positive for appetite change, fatigue and fever. Negative for chills.  HENT:  Positive for ear pain (stuff), sinus pain and sore throat.   Respiratory:  Positive for shortness of breath and wheezing.   Cardiovascular:  Negative for chest pain.  Gastrointestinal:  Positive for diarrhea. Negative for abdominal pain and nausea.  Neurological:  Positive for headaches.   Objective:  Pulse 98 Comment: per patient today  Temp 99.5 F (37.5 C) Comment: per patient yesterday  LMP 02/27/2022   SpO2 99%   Wt Readings from Last 3 Encounters:  01/14/22 299 lb 8 oz (135.9 kg)  11/17/21 (!) 312 lb 4 oz (141.6 kg)  07/17/20 286 lb 12.8 oz (130.1 kg)       Physical exam: Gen: alert,  NAD, not ill appearing Pulm: speaks in complete sentences without increased work of breathing Psych: normal mood, normal thought content      Results for orders placed or performed in visit on 11/17/21  CBC  Result Value Ref Range   WBC 10.2 4.0 - 10.5 K/uL   RBC 4.41 3.87 - 5.11 Mil/uL   Platelets 354.0 150.0 - 400.0 K/uL   Hemoglobin 14.2 12.0 - 15.0 g/dL   HCT 42.5 36.0 - 46.0 %   MCV 96.4 78.0 - 100.0 fl   MCHC 33.4 30.0 - 36.0 g/dL   RDW 13.5 11.5 - 15.5 %  Comprehensive metabolic panel  Result Value Ref Range   Sodium 138 135 - 145 mEq/L   Potassium 4.0 3.5 - 5.1 mEq/L   Chloride 101 96 - 112 mEq/L   CO2 29 19 - 32 mEq/L   Glucose, Bld 92 70 - 99 mg/dL   BUN 13 6 - 23 mg/dL   Creatinine, Ser 0.60 0.40 - 1.20 mg/dL   Total Bilirubin 0.6 0.2 - 1.2 mg/dL   Alkaline Phosphatase 74 39 - 117 U/L   AST 49 (H) 0 - 37 U/L   ALT 77 (H) 0 - 35 U/L   Total Protein 7.1 6.0 - 8.3 g/dL   Albumin 4.4 3.5 - 5.2 g/dL   GFR 109.57 >60.00 mL/min   Calcium 10.3 8.4 - 10.5 mg/dL  Hemoglobin A1c  Result Value Ref Range   Hgb A1c MFr Bld 6.0 4.6 - 6.5 %  TSH  Result Value Ref Range   TSH 2.42 0.35 - 5.50 uIU/mL  Lipid panel  Result Value Ref Range   Cholesterol 194 0 - 200 mg/dL   Triglycerides 131.0 0.0 - 149.0 mg/dL   HDL 47.10 >39.00 mg/dL   VLDL 26.2 0.0 - 40.0 mg/dL   LDL Cholesterol 120 (H) 0 - 99 mg/dL   Total CHOL/HDL Ratio 4    NonHDL 146.69   Microalbumin / creatinine urine ratio  Result Value Ref Range   Microalb, Ur <0.7 0.0 - 1.9 mg/dL   Creatinine,U 22.2 mg/dL   Microalb Creat Ratio 3.2 0.0 - 30.0 mg/g   Assessment & Plan:   Problem List Items Addressed This Visit       Respiratory   Upper respiratory tract infection    Symptoms been going on for approximately 8 or 9 days.  Outside the window the COVID test.  Will elect to treat with Augmentin 875-125 mg twice daily for 7 days.  Did review signs and symptoms when to be seen in person.  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  Patient has had a difficult time with a lower dose of venlafaxine.  She was titrated up to 75 mg daily approximately 3 days ago.  We will continue that for 4 weeks with virtual follow-up.      Shortness of breath    Patient endorsed shortness of breath and did look slightly dyspneic on video.  We will go ahead and treat her with prednisone 20 mg taper.  Precautions reviewed when to be seen in office, urgently or emergently.  Follow-up if no improvement      Relevant Medications   predniSONE (DELTASONE) 20 MG tablet     Meds ordered this encounter  Medications   amoxicillin-clavulanate (AUGMENTIN) 875-125 MG tablet    Sig: Take 1 tablet by mouth 2 (two) times daily for 7 days.    Dispense:  14 tablet    Refill:  0    Order Specific Question:   Supervising Provider    Answer:   Glori Bickers, MARNE A [[id number]]   predniSONE (DELTASONE) 20 MG tablet    Sig: Take 1 tablet (20 mg total) by mouth 2 (two) times daily with a meal for 3 days, THEN 1 tablet (20 mg total) daily with breakfast for 3 days. Avoid NSAIDS like: ibuprofen, motrin, aleve, naproxen, BC/Goody powders.    Dispense:  9 tablet    Refill:  0    Order Specific Question:   Supervising Provider    Answer:   TOWER, MARNE A [[id number]]   No orders of the defined types were placed in this encounter.   I discussed the assessment and treatment plan with the patient. The patient was provided an opportunity to ask questions and all were answered. The patient agreed with the plan and demonstrated an understanding of the instructions. The patient was advised to call back or seek an in-person evaluation if the symptoms worsen or if the condition fails to improve as anticipated.  Follow up  plan: Return in about 4 weeks (around 04/20/2022) for Virtaul f/u for depression .  Romilda Garret, NP

## 2022-03-23 NOTE — Telephone Encounter (Signed)
Can we call and get the patient scheduled for a 4 week virtual follow up for her depression

## 2022-03-23 NOTE — Telephone Encounter (Signed)
Called pt, no answer. Left vm to call back  

## 2022-03-23 NOTE — Assessment & Plan Note (Signed)
Symptoms been going on for approximately 8 or 9 days.  Outside the window the COVID test.  Will elect to treat with Augmentin 875-125 mg twice daily for 7 days.  Did review signs and symptoms when to be seen in person.  Follow-up if no improvement

## 2022-04-13 ENCOUNTER — Other Ambulatory Visit: Payer: Self-pay | Admitting: Family

## 2022-04-13 DIAGNOSIS — F320 Major depressive disorder, single episode, mild: Secondary | ICD-10-CM

## 2022-04-15 MED ORDER — VENLAFAXINE HCL ER 75 MG PO CP24
75.0000 mg | ORAL_CAPSULE | Freq: Every day | ORAL | 0 refills | Status: DC
Start: 2022-04-15 — End: 2022-07-16

## 2022-06-03 ENCOUNTER — Encounter: Payer: Self-pay | Admitting: Nurse Practitioner

## 2022-06-14 ENCOUNTER — Other Ambulatory Visit: Payer: Self-pay | Admitting: Nurse Practitioner

## 2022-06-14 DIAGNOSIS — I10 Essential (primary) hypertension: Secondary | ICD-10-CM

## 2022-07-16 ENCOUNTER — Other Ambulatory Visit: Payer: Self-pay | Admitting: Nurse Practitioner

## 2022-07-16 DIAGNOSIS — F320 Major depressive disorder, single episode, mild: Secondary | ICD-10-CM

## 2022-07-17 ENCOUNTER — Other Ambulatory Visit: Payer: Self-pay | Admitting: Nurse Practitioner

## 2022-07-17 DIAGNOSIS — F320 Major depressive disorder, single episode, mild: Secondary | ICD-10-CM

## 2022-10-12 ENCOUNTER — Other Ambulatory Visit: Payer: Self-pay | Admitting: Nurse Practitioner

## 2022-10-12 DIAGNOSIS — F320 Major depressive disorder, single episode, mild: Secondary | ICD-10-CM

## 2022-10-12 NOTE — Telephone Encounter (Signed)
Back in September you requested a 4 week follow up. Does not look like she has ever had. Do you want [place] to call for follow up before refills?

## 2022-12-09 ENCOUNTER — Other Ambulatory Visit: Payer: Self-pay | Admitting: Nurse Practitioner

## 2022-12-09 DIAGNOSIS — I10 Essential (primary) hypertension: Secondary | ICD-10-CM

## 2022-12-09 NOTE — Telephone Encounter (Signed)
Can we get her in for a CPE? I think last we spoke she may not have had insurance

## 2022-12-09 NOTE — Telephone Encounter (Signed)
Lvmtcb, sent mychart message  

## 2022-12-11 NOTE — Telephone Encounter (Signed)
Schedule cpe for 02/15/23

## 2023-01-07 ENCOUNTER — Other Ambulatory Visit: Payer: Self-pay | Admitting: Nurse Practitioner

## 2023-01-07 DIAGNOSIS — F320 Major depressive disorder, single episode, mild: Secondary | ICD-10-CM

## 2023-01-07 NOTE — Telephone Encounter (Signed)
Prescription Request  01/07/2023  LOV: 01/14/2022  What is the name of the medication or equipment?  Venlafaxine HCl 75 mg  Have you contacted your pharmacy to request a refill? No   Which pharmacy would you like this sent to?  [facility] [address] [place], [place] - [address]   Patient notified that their request is being sent to the clinical staff for review and that they should receive a response within 2 business days.   Please advise at Mobile [phone] (mobile)   Cpe already scheduled for 02/15/23

## 2023-01-27 ENCOUNTER — Encounter (INDEPENDENT_AMBULATORY_CARE_PROVIDER_SITE_OTHER): Payer: Self-pay

## 2023-02-15 ENCOUNTER — Encounter: Payer: Self-pay | Admitting: Nurse Practitioner

## 2023-03-10 ENCOUNTER — Other Ambulatory Visit: Payer: Self-pay | Admitting: Nurse Practitioner

## 2023-03-10 DIAGNOSIS — I10 Essential (primary) hypertension: Secondary | ICD-10-CM

## 2023-04-04 ENCOUNTER — Other Ambulatory Visit: Payer: Self-pay | Admitting: Nurse Practitioner

## 2023-04-04 DIAGNOSIS — F320 Major depressive disorder, single episode, mild: Secondary | ICD-10-CM

## 2023-05-06 ENCOUNTER — Ambulatory Visit (INDEPENDENT_AMBULATORY_CARE_PROVIDER_SITE_OTHER): Payer: Self-pay | Admitting: Nurse Practitioner

## 2023-05-06 ENCOUNTER — Encounter: Payer: Self-pay | Admitting: Nurse Practitioner

## 2023-05-06 VITALS — BP 112/84 | HR 113 | Temp 97.7°F | Ht 64.0 in | Wt 305.4 lb

## 2023-05-06 DIAGNOSIS — Z5971 Insufficient health insurance coverage: Secondary | ICD-10-CM | POA: Insufficient documentation

## 2023-05-06 DIAGNOSIS — I10 Essential (primary) hypertension: Secondary | ICD-10-CM

## 2023-05-06 DIAGNOSIS — F320 Major depressive disorder, single episode, mild: Secondary | ICD-10-CM

## 2023-05-06 MED ORDER — DULOXETINE HCL 60 MG PO CPEP
60.0000 mg | ORAL_CAPSULE | Freq: Every day | ORAL | 0 refills | Status: DC
Start: 2023-05-06 — End: 2023-08-03

## 2023-05-06 NOTE — Assessment & Plan Note (Signed)
Pending A1c continue working on lifestyle modifications

## 2023-05-06 NOTE — Assessment & Plan Note (Signed)
Patient currently not insured.  States she is working on it.  States she does not qualify for Medicaid patient is due for preventative healthcare exam school encouraged with social worker to see if he can navigate some of this in a cost efficient way for patient.  Ambulatory referral to social work placed today

## 2023-05-06 NOTE — Patient Instructions (Signed)
Nice to see you today I will be in touch with the labs once I have them Follow up in 1 year, sooner if you need me  We are going to stop the effexor (venlafaxine) and will start Cymbalta (duloxetine).

## 2023-05-06 NOTE — Assessment & Plan Note (Signed)
Patient currently maintained on losartan and triamterene-hydrochlorothiazide.  Blood pressure well-controlled pending labs today continue medication as prescribed

## 2023-05-06 NOTE — Assessment & Plan Note (Signed)
Patient still struggling with some depression and lack of motivation sleeping too much with emotional eating.  Patient was doing well on duloxetine but caused bruising.  Patient states that she was also taking NSAIDs at the time wonder if that was the reason we will discontinue venlafaxine 75 mg and switch 1-1 to duloxetine 60 mg.  She will reach out to me to see if the bruising happens again or if is ineffective with her mood

## 2023-05-06 NOTE — Progress Notes (Signed)
Established Patient Office Visit  Subjective   Patient ID: Vanessa Bean, female    DOB: 1975/09/02  Age: 47 y.o. MRN: [id number]  Chief Complaint  Patient presents with   Annual Exam    HPI  HTN: olmesartan and triamterene-hydrochlorothiazide. Tolerating it well. States that she does have a blood pressure cuff at home   MDD: Patient currently maintained on venlafaxine 75 mg daily. States that the past 3-4 months the family has been being sick. State that she is back at the point of not caring. In regards to cleaning    Immunizations: -Tetanus: Completed in 2017 -Influenza: get at local  -Shingles: Too young -Pneumonia: Too young    Colonoscopy: Currently due Lung Cancer Screening: Completed in   Pap smear: Overdue  Mammogram: Overdue  DEXA: Too young  Sleep:  goes to bed around 1030-11 and will get up 1030-11. States that she is not wanting to sleep that much. Does not feel rested when she sleeps. Sometimes she will. Does snore        05/06/2023    2:06 PM 03/23/2022   11:20 AM 01/14/2022    8:27 AM  PHQ9 SCORE ONLY  PHQ-9 Total Score 12 22 19        05/06/2023    2:06 PM 03/23/2022   11:21 AM 01/14/2022    8:28 AM 11/17/2021   12:45 PM  GAD 7 : Generalized Anxiety Score  Nervous, Anxious, on Edge 1 1 2 2   Control/stop worrying 1 3 3 3   Worry too much - different things 0 3 3 3   Trouble relaxing 1 3 2 1   Restless 0 1 1 0  Easily annoyed or irritable 0 3 2 2   Afraid - awful might happen 0 1 3 3   Total GAD 7 Score 3 15 16 14   Anxiety Difficulty Somewhat difficult  Very difficult         Review of Systems  Constitutional:  Negative for chills and fever.  Respiratory:  Negative for shortness of breath.   Cardiovascular:  Negative for chest pain and leg swelling.  Gastrointestinal:  Negative for abdominal pain, blood in stool, constipation, diarrhea, nausea and vomiting.       BM daily   Genitourinary:  Negative for dysuria and hematuria.  Neurological:   Negative for tingling and headaches.  Psychiatric/Behavioral:  Negative for hallucinations and suicidal ideas.       Objective:     BP 112/84   Pulse (!) 113   Temp 97.7 F (36.5 C) (Oral)   Ht 5\' 4"  (1.626 m)   Wt (!) 305 lb 6.4 oz (138.5 kg)   LMP 04/16/2023 (Exact Date)   SpO2 97%   BMI 52.42 kg/m  BP Readings from Last 3 Encounters:  05/06/23 112/84  01/14/22 110/76  11/17/21 128/78   Wt Readings from Last 3 Encounters:  05/06/23 (!) 305 lb 6.4 oz (138.5 kg)  01/14/22 299 lb 8 oz (135.9 kg)  11/17/21 (!) 312 lb 4 oz (141.6 kg)   SpO2 Readings from Last 3 Encounters:  05/06/23 97%  03/23/22 99%  01/14/22 99%      Physical Exam Vitals and nursing note reviewed.  Constitutional:      Appearance: Normal appearance.  Cardiovascular:     Rate and Rhythm: Normal rate and regular rhythm.     Heart sounds: Normal heart sounds.  Pulmonary:     Effort: Pulmonary effort is normal.     Breath sounds: Normal breath sounds.  Abdominal:     General: Bowel sounds are normal.  Musculoskeletal:     Right lower leg: No edema.     Left lower leg: No edema.  Neurological:     Mental Status: She is alert.      No results found for any visits on 05/06/23.    The 10-year ASCVD risk score (Arnett DK, et al., 2019) is: 1%    Assessment & Plan:   Problem List Items Addressed This Visit       Cardiovascular and Mediastinum   Primary hypertension - Primary    Patient currently maintained on losartan and triamterene-hydrochlorothiazide.  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  Patient was doing well on duloxetine but caused bruising.  Patient states that she was  also taking NSAIDs at the time wonder if that was the reason we will discontinue venlafaxine 75 mg and switch 1-1 to duloxetine 60 mg.  She will reach out to me to see if the bruising happens again or if is ineffective with her mood      Relevant Medications   DULoxetine (CYMBALTA) 60 MG capsule   Morbid obesity (HCC)    Pending A1c continue working on lifestyle modifications      Relevant Orders   Hemoglobin A1c   Underinsured    Patient currently not insured.  States she is working on it.  States she does not qualify for Medicaid patient is due for preventative healthcare exam school encouraged with social worker to see if he can navigate some of this in a cost efficient way for patient.  Ambulatory referral to social work placed today      Relevant Orders   Ambulatory referral to Social Work    Return in about 1 year (around 05/05/2024) for CPE and Labs.    Audria Nine, NP

## 2023-05-07 ENCOUNTER — Telehealth: Payer: Self-pay | Admitting: *Deleted

## 2023-05-07 LAB — COMPREHENSIVE METABOLIC PANEL
ALT: 26 U/L (ref 0–35)
AST: 20 U/L (ref 0–37)
Albumin: 4.5 g/dL (ref 3.5–5.2)
Alkaline Phosphatase: 94 U/L (ref 39–117)
BUN: 14 mg/dL (ref 6–23)
CO2: 28 meq/L (ref 19–32)
Calcium: 9.6 mg/dL (ref 8.4–10.5)
Chloride: 100 meq/L (ref 96–112)
Creatinine, Ser: 0.57 mg/dL (ref 0.40–1.20)
GFR: 109.8 mL/min (ref >60.00)
Glucose, Bld: 129 mg/dL — ABNORMAL HIGH (ref 70–99)
Potassium: 3.7 meq/L (ref 3.5–5.1)
Sodium: 138 meq/L (ref 135–145)
Total Bilirubin: 0.5 mg/dL (ref 0.2–1.2)
Total Protein: 7.3 g/dL (ref 6.0–8.3)

## 2023-05-07 LAB — HEMOGLOBIN A1C: Hgb A1c MFr Bld: 5.9 % (ref 4.6–6.5)

## 2023-05-07 LAB — CBC
HCT: 43.7 % (ref 36.0–46.0)
Hemoglobin: 14.8 g/dL (ref 12.0–15.0)
MCHC: 33.7 g/dL (ref 30.0–36.0)
MCV: 97 fL (ref 78.0–100.0)
Platelets: 416 10*3/uL — ABNORMAL HIGH (ref 150.0–400.0)
RBC: 4.51 Mil/uL (ref 3.87–5.11)
RDW: 13.4 % (ref 11.5–15.5)
WBC: 10.3 10*3/uL (ref 4.0–10.5)

## 2023-05-07 NOTE — Progress Notes (Unsigned)
  Care Coordination  Outreach Note  05/07/2023 Name: Vanessa Bean MRN: [id number] DOB: 1976/05/07   Care Coordination Outreach Attempts: An unsuccessful telephone outreach was attempted today to offer the patient information about available care coordination services.  Follow Up Plan:  Additional outreach attempts will be made to offer the patient care coordination information and services.   Encounter Outcome:  No Answer  Burman Nieves, CCMA Care Coordination Care Guide Direct Dial: [phone]

## 2023-05-10 ENCOUNTER — Other Ambulatory Visit: Payer: Self-pay | Admitting: Nurse Practitioner

## 2023-05-10 DIAGNOSIS — Z5971 Insufficient health insurance coverage: Secondary | ICD-10-CM

## 2023-05-10 NOTE — Progress Notes (Unsigned)
  Care Coordination  Outreach Note  05/10/2023 Name: JASARA DARBYSHIRE MRN: [id number] DOB: May 29, 1976   Care Coordination Outreach Attempts: A second unsuccessful outreach was attempted today to offer the patient with information about available care coordination services.  Follow Up Plan:  Additional outreach attempts will be made to offer the patient care coordination information and services.   Encounter Outcome:  No Answer  Burman Nieves, CCMA Care Coordination Care Guide Direct Dial: [phone]

## 2023-05-11 NOTE — Progress Notes (Signed)
  Care Coordination  Outreach Note  05/11/2023 Name: Vanessa Bean MRN: [id number] DOB: November 29, 1975   Care Coordination Outreach Attempts: A third unsuccessful outreach was attempted today to offer the patient with information about available care coordination services.  Follow Up Plan:  No further outreach attempts will be made at this time. We have been unable to contact the patient to offer or enroll patient in care coordination services  Encounter Outcome:  No Answer  Burman Nieves, [facility] Care Coordination Care Guide Direct Dial: [phone]

## 2023-05-20 ENCOUNTER — Encounter: Payer: Self-pay | Admitting: Nurse Practitioner

## 2023-05-20 DIAGNOSIS — B001 Herpesviral vesicular dermatitis: Secondary | ICD-10-CM

## 2023-05-21 MED ORDER — VALACYCLOVIR HCL 1 G PO TABS
2000.0000 mg | ORAL_TABLET | Freq: Two times a day (BID) | ORAL | 0 refills | Status: AC
Start: 2023-05-21 — End: 2023-05-22

## 2023-06-09 ENCOUNTER — Encounter: Payer: Self-pay | Admitting: Nurse Practitioner

## 2023-06-10 ENCOUNTER — Telehealth: Payer: Self-pay | Admitting: Nurse Practitioner

## 2023-06-10 MED ORDER — VALACYCLOVIR HCL 1 G PO TABS: 2000.0000 mg | ORAL_TABLET | Freq: Two times a day (BID) | ORAL | 2 refills | Status: DC

## 2023-06-10 NOTE — Telephone Encounter (Signed)
Left detailed voicemail for patient to call the office back with an update on her how she is doing.

## 2023-06-10 NOTE — Telephone Encounter (Signed)
Can we call and check to see if patient is doing better with the switch back to duloxetine.  Also check to see if she is having any bruising.

## 2023-06-10 NOTE — Telephone Encounter (Signed)
-----   Message from [facility] sent at 05/06/2023  2:30 PM EST ----- Regarding: Duloxetine Check with patient to see if the switch from venlafaxine duloxetine was beneficial.  Check on mood and see if she is having bruising since switching back to duloxetine.

## 2023-06-11 ENCOUNTER — Other Ambulatory Visit: Payer: Self-pay | Admitting: Nurse Practitioner

## 2023-06-11 DIAGNOSIS — I10 Essential (primary) hypertension: Secondary | ICD-10-CM

## 2023-06-14 NOTE — Telephone Encounter (Signed)
Called patient she states that she is not having any bruising. States she has been taking as directed. She can't tell a big change in her symptoms. She states that she can feel very little but not as much improvement than when she was on it in the past. She is taking Cymbalta 60mg  denies any missed doses. She denies any other changes in her medicine

## 2023-06-14 NOTE — Telephone Encounter (Signed)
Noted  

## 2023-06-16 ENCOUNTER — Ambulatory Visit: Payer: Self-pay | Admitting: Nurse Practitioner

## 2023-06-16 NOTE — Telephone Encounter (Signed)
Noted  

## 2023-06-16 NOTE — Telephone Encounter (Signed)
  Chief Complaint: neck pain Symptoms: left sided neck pain radiating to jaw Frequency: comes and goes Pertinent Negatives: Patient denies fever, inability to move neck, numbness/weakness of extremeties Disposition: [] ED /[] Urgent Care (no appt availability in office) / [x] Appointment(In office/virtual)/ []  [facility]/ [] Home Care/ [] Refused Recommended Disposition /[] [facility]/ []  Follow-up with PCP Additional Notes: Patient reports that she has been having left sided neck pain that radiates to her jaw and behind her ear since this morning. Patient reports it has been manageable with Tylenol. Patient reports she thinks this may be related to ongoing back pain that she has been having and is a result of sleeping in the wrong position. Per protocol, this RN scheduled appt 12/19 in office. Patient advised to call back with worsening symptoms. Patient verbalized understanding.    Copied from CRM [id number]. Topic: Appointments - Appointment Scheduling >> Jun 16, 2023 11:05 AM [place] A wrote: Patient is wanting to schedule appointment due having constant pain on left side of her head leading to neck area. Reason for Disposition  [1] MODERATE neck pain (e.g., interferes with normal activities) AND [2] present > 3 days  Answer Assessment - Initial Assessment Questions 1. ONSET: "When did the pain begin?"      This morning 2. LOCATION: "Where does it hurt?"      Left side of neck into jaw/ behind ear 3. PATTERN "Does the pain come and go, or has it been constant since it started?"      Comes and goes 4. SEVERITY: "How bad is the pain?"  (Scale 1-10; or mild, moderate, severe)   - NO PAIN (0): no pain or only slight stiffness    - MILD (1-3): doesn't interfere with normal activities    - MODERATE (4-7): interferes with normal activities or awakens from sleep    - SEVERE (8-10):  excruciating pain, unable to do any normal activities      moderate 5. RADIATION: "Does the  pain go anywhere else, shoot into your arms?"     Both arms 6. CORD SYMPTOMS: "Any weakness or numbness of the arms or legs?"     no 7. CAUSE: "What do you think is causing the neck pain?"     Back pain that is worse due to sleeping position 8. NECK OVERUSE: "Any recent activities that involved turning or twisting the neck?"     no 9. OTHER SYMPTOMS: "Do you have any other symptoms?" (e.g., headache, fever, chest pain, difficulty breathing, neck swelling)     Stuffy nose  Protocols used: Neck Pain or Stiffness-A-AH

## 2023-06-17 ENCOUNTER — Ambulatory Visit: Payer: Self-pay | Admitting: Internal Medicine

## 2023-08-03 ENCOUNTER — Other Ambulatory Visit: Payer: Self-pay | Admitting: Nurse Practitioner

## 2023-08-03 DIAGNOSIS — F320 Major depressive disorder, single episode, mild: Secondary | ICD-10-CM

## 2023-08-28 ENCOUNTER — Encounter: Payer: Self-pay | Admitting: Nurse Practitioner

## 2023-09-08 ENCOUNTER — Encounter: Payer: Self-pay | Admitting: Family Medicine

## 2023-09-08 ENCOUNTER — Ambulatory Visit (INDEPENDENT_AMBULATORY_CARE_PROVIDER_SITE_OTHER): Payer: Self-pay | Admitting: Family Medicine

## 2023-09-08 ENCOUNTER — Ambulatory Visit: Payer: Self-pay | Admitting: Nurse Practitioner

## 2023-09-08 ENCOUNTER — Encounter: Payer: Self-pay | Admitting: Nurse Practitioner

## 2023-09-08 VITALS — BP 139/88 | HR 128 | Temp 98.7°F | Ht 64.0 in | Wt 308.0 lb

## 2023-09-08 DIAGNOSIS — R000 Tachycardia, unspecified: Secondary | ICD-10-CM

## 2023-09-08 DIAGNOSIS — R5383 Other fatigue: Secondary | ICD-10-CM | POA: Insufficient documentation

## 2023-09-08 DIAGNOSIS — I10 Essential (primary) hypertension: Secondary | ICD-10-CM

## 2023-09-08 DIAGNOSIS — R5382 Chronic fatigue, unspecified: Secondary | ICD-10-CM

## 2023-09-08 DIAGNOSIS — F320 Major depressive disorder, single episode, mild: Secondary | ICD-10-CM

## 2023-09-08 DIAGNOSIS — F419 Anxiety disorder, unspecified: Secondary | ICD-10-CM | POA: Insufficient documentation

## 2023-09-08 DIAGNOSIS — R002 Palpitations: Secondary | ICD-10-CM

## 2023-09-08 MED ORDER — OLMESARTAN MEDOXOMIL 40 MG PO TABS: 40.0000 mg | ORAL_TABLET | Freq: Every day | ORAL | 0 refills | Status: DC

## 2023-09-08 MED ORDER — METOPROLOL SUCCINATE ER 25 MG PO TB24: 25.0000 mg | ORAL_TABLET | Freq: Every day | ORAL | 1 refills | Status: DC

## 2023-09-08 NOTE — Progress Notes (Signed)
 Subjective:    Patient ID: Vanessa Bean, female    DOB: 1976-01-21, 48 y.o.   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 of NP Cable presents for blood pressure concerns  Pt has had some increased readings in the past few weeks Checked on 3 different machines  Almost could not have a dental procedure  140/104, 163/109  Has felt a little more foggy  No headache but some head pressure  Saw spots in vision once   She has a cuff / but checks at other places   No cp  Has had palpitations occational /not usual    No missed medication   BP Readings from Last 3 Encounters:  09/08/23 139/88  05/06/23 112/84  01/14/22 110/76   Pulse Readings from Last 3 Encounters:  09/08/23 (!) 128  05/06/23 (!) 113  03/23/22 98   Olmesartan 20 mg daily   Triam-hct  37.5-25 mg daily    High stress life  Life changes  Frustrated by her weight  Too tired to exercise (also old ankle injury)  Is stressed all the time  Prone to anxiety  More moody lately   Period is regular  ? Perimenopause Hot natured   She sleeps 12-14 h per day  Other days she is up 24 hours      Takes cymbalta for depression and ankle pain 60 mg daily    Lab Results  Component Value Date   TSH 2.42 11/17/2021    Lab Results  Component Value Date   WBC 10.3 05/06/2023   HGB 14.8 05/06/2023   HCT 43.7 05/06/2023   MCV 97.0 05/06/2023   PLT 416.0 (H) 05/06/2023   Lab Results  Component Value Date   HGBA1C 5.9 05/06/2023    Has appointment with Matt on 4/1        Patient Active Problem List   Diagnosis Date Noted   Tachycardia 09/08/2023   Fatigue 09/08/2023   Morbid obesity (HCC) 05/06/2023   Underinsured 05/06/2023    Shortness of breath 03/23/2022   Upper respiratory tract infection 03/23/2022   Left knee pain 01/14/2022   Menorrhagia with regular cycle 11/17/2021   Primary hypertension 11/17/2021   Current mild episode of major depressive disorder without prior episode (HCC) 11/17/2021   Palpitations 11/17/2021   Class 3 severe obesity due to excess calories with serious comorbidity and body mass index (BMI) of 50.0 to 59.9 in adult [facility]) 11/17/2021   Encounter for medical examination to establish care 11/17/2021   Right lower quadrant abdominal mass 11/17/2021   Sepsis due to urinary tract infection (HCC) 11/07/2019   Past Medical History:  Diagnosis Date   History of bronchitis    History of kidney stones    Hypertension    Past Surgical History:  Procedure Laterality Date   ANKLE FRACTURE SURGERY Right    CESAREAN SECTION     CHOLECYSTECTOMY     CYSTOSCOPY W/ RETROGRADES Right 11/07/2019   Procedure: CYSTOSCOPY RIGHT URETERAL STENT PLACEMENT WITH RETROGRADE PYELOGRAM;  Surgeon: Bjorn Pippin, MD;  Location: [facility];  Service: Urology;  Laterality: Right;  CYSTOSCOPY/URETEROSCOPY/HOLMIUM LASER/STENT PLACEMENT Right 11/23/2019   Procedure: RIGHT URETEROSCOPY//STENT EXCHANGE;  Surgeon: Bjorn Pippin, MD;  Location: [facility];  Service: Urology;  Laterality: Right;   DILATION AND CURETTAGE OF UTERUS     Social History   Tobacco Use   Smoking status: Former    Current packs/day: 0.00    Types: Cigarettes    Quit date: 2015    Years since quitting: 10.2   Smokeless tobacco: Never  Vaping Use   Vaping status: Never Used  Substance Use Topics   Alcohol use: Not Currently    Comment: Twice a month with all 3  2-3   Drug use: No   Family History  Problem Relation Age of Onset   Heart disease Mother        CHF   Hypertension Mother    Diabetes Mother    Alcohol abuse Father    Schizophrenia Father    Bipolar disorder Father    Diabetes Maternal Grandmother    Cancer Maternal Grandmother         Breast cancer   Allergies  Allergen Reactions   Duloxetine Other (See Comments)    bruising   Current Outpatient Medications on File Prior to Visit  Medication Sig Dispense Refill   DULoxetine (CYMBALTA) 60 MG capsule Take 1 capsule by mouth once daily 90 capsule 1   Multiple Vitamin (MULTIVITAMIN WITH MINERALS) TABS tablet Take 1 tablet by mouth daily.     triamterene-hydrochlorothiazide (MAXZIDE-25) 37.5-25 MG tablet Take 1 tablet by mouth once daily 90 tablet 2   valACYclovir (VALTREX) 1000 MG tablet Take 2 tablets (2,000 mg total) by mouth 2 (two) times daily. 4 tablet 2   olmesartan (BENICAR) 40 MG tablet Take 1 tablet (40 mg total) by mouth daily. 90 tablet 0   No current facility-administered medications on file prior to visit.    Review of Systems  Constitutional:  Positive for fatigue. Negative for activity change, appetite change, fever and unexpected weight change.  HENT:  Negative for congestion, ear pain, rhinorrhea, sinus pressure and sore throat.   Eyes:  Negative for pain, redness and visual disturbance.  Respiratory:  Negative for cough, shortness of breath and wheezing.   Cardiovascular:  Positive for palpitations. Negative for chest pain and leg swelling.  Gastrointestinal:  Negative for abdominal pain, blood in stool, constipation and diarrhea.  Endocrine: Negative for polydipsia and polyuria.  Genitourinary:  Negative for dysuria, frequency and urgency.  Musculoskeletal:  Negative for arthralgias, back pain and myalgias.  Skin:  Negative for pallor and rash.  Allergic/Immunologic: Negative for environmental allergies.  Neurological:  Negative for dizziness, syncope and headaches.       Head pressure but not pain   Hematological:  Negative for adenopathy. Does not bruise/bleed easily.  Psychiatric/Behavioral:  Positive for sleep disturbance. Negative for agitation, decreased concentration and dysphoric mood. The patient is nervous/anxious.         Objective:   Physical Exam Constitutional:      General: She is not in acute distress.    Appearance: Normal appearance. She is well-developed. She is obese. She is not ill-appearing or diaphoretic.  HENT:     Head: Normocephalic and atraumatic.     Mouth/Throat:     Mouth: Mucous membranes are moist.  Eyes:     General: No scleral icterus.    Conjunctiva/sclera: Conjunctivae normal.     Pupils: Pupils are equal, round, and reactive to light.  Neck:     Thyroid: No thyromegaly.  Vascular: No carotid bruit or JVD.  Cardiovascular:     Rate and Rhythm: Normal rate and regular rhythm.     Heart sounds: Normal heart sounds.     No gallop.  Pulmonary:     Effort: Pulmonary effort is normal. No respiratory distress.     Breath sounds: Normal breath sounds. No wheezing or rales.  Abdominal:     General: Abdomen is protuberant. There is no distension or abdominal bruit.     Palpations: Abdomen is soft. There is no mass or pulsatile mass.     Tenderness: There is no abdominal tenderness.  Musculoskeletal:     Cervical back: Normal range of motion and neck supple.     Right lower leg: No edema.     Left lower leg: No edema.  Lymphadenopathy:     Cervical: No cervical adenopathy.  Skin:    General: Skin is warm and dry.     Coloration: Skin is not pale.     Findings: No rash.  Neurological:     Mental Status: She is alert.     Cranial Nerves: No cranial nerve deficit.     Motor: No weakness.     Coordination: Coordination normal.     Gait: Gait normal.     Deep Tendon Reflexes: Reflexes are normal and symmetric. Reflexes normal.     Comments:  No tremor   Psychiatric:        Attention and Perception: Attention normal.        Mood and Affect: Mood is anxious. Affect is tearful.     Comments: Occational tearful  Candidly discusses symptoms and stressors             Assessment & Plan:   Problem List Items Addressed This Visit       Cardiovascular and Mediastinum    Primary hypertension - Primary   Blood pressure is better here than at home  BP: 122/74  2nd check higher at   Olmesartan 20 mg daily  Maxzide 37.5-25 mg daily   Some mood changes with anxiety  Some palpitations       Relevant Medications   metoprolol succinate (TOPROL-XL) 25 MG 24 hr tablet   Other Relevant Orders   EKG 12-Lead (Completed)   TSH     Other   Tachycardia   Relevant Orders   EKG 12-Lead (Completed)   TSH   Palpitations   These come and go  May be related to mood  Reviewed last pcp note  Today rate is high/ also short PR but normal QRS on today's EKG   Will add low dose metoprolol xl 25 mg daily for rate and blood pressure  Follow up with pcp  Encouraged to call if side effects or if no improvement         Relevant Orders   EKG 12-Lead (Completed)   TSH   Fatigue   Ongoing  Reviewed last note and labs from pcp / planning follow up   Tachycardic today  TSH ordered   May be going through some perimenopause and mood changes as well      Relevant Orders   TSH   Current mild episode of major depressive disorder without prior episode (HCC)

## 2023-09-08 NOTE — Addendum Note (Signed)
 Addended by: Eden Emms on: 09/08/2023 04:53 PM   Modules accepted: Orders

## 2023-09-08 NOTE — Assessment & Plan Note (Signed)
 Ongoing  Reviewed last note and labs from pcp / planning follow up   Tachycardic today  TSH ordered   May be going through some perimenopause and mood changes as well

## 2023-09-08 NOTE — Telephone Encounter (Signed)
  Chief Complaint: high blood pressure Symptoms: elevated BP and fatigue Frequency: has been running high for past 2 weeks Pertinent Negatives: Patient denies sob, chest pain,  Disposition: [] ED /[] Urgent Care (no appt availability in office) / [x] Appointment(In office/virtual)/ []  [facility]/ [] Home Care/ [] Refused Recommended Disposition /[] [facility]/ []  Follow-up with PCP Additional Notes: Patient states that for the past couple weeks even on her BP medication her readings have been elevated.  Even when they have been taken at her dentist office or at [facility] they have been running high. Patient states that she has not missed any doses of her medication.  Patient states that she was feeling fine early to then started to feel fatigue and when she took her BP it was 140/104.    Reason for Disposition  Systolic BP  >= 160 OR Diastolic >= 100  Answer Assessment - Initial Assessment Questions 1. BLOOD PRESSURE: "What is the blood pressure?" "Did you take at least two measurements 5 minutes apart?"     140/104 2. ONSET: "When did you take your blood pressure?"     About 15 minutes 3. HOW: "How did you take your blood pressure?" (e.g., automatic home BP monitor, visiting nurse)     Automatic home cuff 4. HISTORY: "Do you have a history of high blood pressure?"     yes 5. MEDICINES: "Are you taking any medicines for blood pressure?" "Have you missed any doses recently?"     Yes haven't missed a dose 6. OTHER SYMPTOMS: "Do you have any symptoms?" (e.g., blurred vision, chest pain, difficulty breathing, headache, weakness)     fatigue  Protocols used: Blood Pressure - High-A-AH

## 2023-09-08 NOTE — Telephone Encounter (Signed)
 Lets have the patient increase her omlesartan to 40mg  daily. I have sent in a new script. Continue to check BP at home. Keep office visit that is scheduled with me on 09/28/2023

## 2023-09-08 NOTE — Assessment & Plan Note (Signed)
 Worse lately  Labile ? If perimenopausal hormone change  Affecting HR and blood pressure   On cymbalta 60  Will follow up with pcp for further eval   TSH drawn today

## 2023-09-08 NOTE — Assessment & Plan Note (Signed)
 Rate in 120s today  Has palpitations on and off  EKG unchanged but read as short PR (normal QRS)  For follow up with pcp   Will try low dose beta blocker  Encouraged to avoid caffeine  Consider addl treatment of anxiety

## 2023-09-08 NOTE — Patient Instructions (Addendum)
 Continue current medicines   Add metoprolol xl 25 mg once daily  If any side effects let us know  Stay well hydrated   Keep watching blood pressure   Thyroid screening lab today    See Susy Frizzle as planned on April 1

## 2023-09-08 NOTE — Assessment & Plan Note (Addendum)
 Blood pressure is better here than at home  BP: 122/74  2nd check higher at 139/88 Suspect it is up and down Also with tachycardia today  Olmesartan 20 mg daily  Maxzide 37.5-25 mg daily   Some mood changes with anxiety  Some palpitations  Increase HR   TSH ordered  Will add metoprolol xl 25 mg daily  Reviewed possible side effects/ encouraged to call  Follow up with pcp as planned  Keep working on lifestyle change

## 2023-09-08 NOTE — Assessment & Plan Note (Signed)
 These come and go  May be related to mood  Reviewed last pcp note  Today rate is high/ also short PR but normal QRS on today's EKG   Will add low dose metoprolol xl 25 mg daily for rate and blood pressure  Follow up with pcp  Encouraged to call if side effects or if no improvement

## 2023-09-09 ENCOUNTER — Encounter: Payer: Self-pay | Admitting: Family Medicine

## 2023-09-09 LAB — TSH: TSH: 2.07 u[IU]/mL (ref 0.35–5.50)

## 2023-09-09 MED ORDER — OLMESARTAN MEDOXOMIL 20 MG PO TABS: 20.0000 mg | ORAL_TABLET | Freq: Every day | ORAL | Status: DC

## 2023-09-09 NOTE — Telephone Encounter (Signed)
 Left message to return call to our office.

## 2023-09-09 NOTE — Addendum Note (Signed)
 Addended by: Eden Emms on: 09/09/2023 12:54 PM   Modules accepted: Orders

## 2023-09-10 NOTE — Telephone Encounter (Signed)
 Left voicemail for patient to call the office back.

## 2023-09-14 NOTE — Telephone Encounter (Signed)
 Pt made aware and was seen by Dr.Towers on 09/09/23.   Most recent Mychart.  Last read by Myrla Halsted at 1:13PM on 09/09/2023.

## 2023-09-23 ENCOUNTER — Ambulatory Visit: Payer: Self-pay | Admitting: Nurse Practitioner

## 2023-09-23 VITALS — BP 128/72 | HR 123 | Temp 98.4°F | Ht 64.0 in | Wt 309.4 lb

## 2023-09-23 DIAGNOSIS — F419 Anxiety disorder, unspecified: Secondary | ICD-10-CM

## 2023-09-23 DIAGNOSIS — I10 Essential (primary) hypertension: Secondary | ICD-10-CM

## 2023-09-23 DIAGNOSIS — R002 Palpitations: Secondary | ICD-10-CM

## 2023-09-23 DIAGNOSIS — R000 Tachycardia, unspecified: Secondary | ICD-10-CM

## 2023-09-23 DIAGNOSIS — R238 Other skin changes: Secondary | ICD-10-CM

## 2023-09-23 MED ORDER — METOPROLOL SUCCINATE ER 50 MG PO TB24: 50.0000 mg | ORAL_TABLET | Freq: Every day | ORAL | 0 refills | Status: DC

## 2023-09-23 MED ORDER — BUSPIRONE HCL 5 MG PO TABS: 5.0000 mg | ORAL_TABLET | Freq: Two times a day (BID) | ORAL | 1 refills | Status: DC

## 2023-09-23 NOTE — Assessment & Plan Note (Signed)
 Increase metoprolol from 25 mg to 50 mg.

## 2023-09-23 NOTE — Patient Instructions (Addendum)
 Nice to see you today I have sent in the medications to the pharmacy  Follow up with me in 1 month, sooner if you need me   I am increasing the metoprolol to 50mg  a day. You can take 2 of the 25mg  tablets.

## 2023-09-23 NOTE — Assessment & Plan Note (Signed)
 Patient currently maintained on metoprolol 25 mg daily, on losartan 20 mg daily, Maxide daily.  Patient's blood pressure controlled heart rate is still elevated.  Patient states he was in the 130s at home we will increase metoprolol to 50 mg daily.  Patient has been will recheck blood pressure and pulse at home.  Follow-up in 1 month

## 2023-09-23 NOTE — Assessment & Plan Note (Signed)
 Was seen in urgent care and diagnosed with a viral exanthem.  Very dyshidrotic eczema the patient does not have itching.  Has improved some we will give it more time to improve on its own.  Continue lidocaine cream as prescribed from urgent care

## 2023-09-23 NOTE — Assessment & Plan Note (Signed)
 Patient already maintained on duloxetine 60 mg daily.  States that she still having breakthrough anxiety will add on BuSpar 5 mg twice daily.

## 2023-09-23 NOTE — Assessment & Plan Note (Signed)
 Has improved with addition metoprolol increased to 50 mg

## 2023-09-23 NOTE — Progress Notes (Signed)
 Established Patient Office Visit  Subjective   Patient ID: Vanessa Bean, female    DOB: [birth year]  Age: 48 y.o. MRN: [id number]  Chief Complaint  Patient presents with   Hypertension    Pt complains of having no energy. States her BP remains in the 140s/101. Pt checks BP daily and takes medication as prescribed.    Foot Pain    Pt complains of left and right foot pain. Started a few days ago. Pt states she woke up and her toes just hurt. Painful to walk on. Pt went to urgent care and was told the pain is due to virus but unsure of what kind. Right foot appears to have 3 red blisters on top of toes.       HTN: patient had reached out and stated that her blood pressure was hight. She was seen in office and placed on metoprolo. She had an adverse drug event and was informted to stop takin gthe metoprolol and is here for a follow up. She is currently maintained on olmesartan 20 and maxzide. States that she did have a headache and dizziness. She stopped the medication and it did not resolve. She started having heart racing. She went back on it and the palpitations have stopped  States that her anxiety has been up the past 2-3 months. She is on the duloxetine. She can sleep or not sleep. She is eating a lot somedays and then she can not eat at all. Her son states that she is nice one minute and then yelling the next. She is having overthinking and worrying. No HI/SI/AVH    Foot pain: patient was seen on 09/20/2023 for bilateral painful blisters to the feet    Review of Systems  Constitutional:  Negative for chills and fever.  Respiratory:  Negative for shortness of breath.   Cardiovascular:  Negative for chest pain.  Skin:  Positive for rash. Negative for itching.  Neurological:  Positive for dizziness. Negative for headaches.  Psychiatric/Behavioral:  Negative for suicidal ideas. The patient is nervous/anxious. The patient does not have insomnia.       Objective:     BP 128/72    Pulse (!) 123   Temp 98.4 F (36.9 C) (Oral)   Ht 5\' 4"  (1.626 m)   Wt (!) 309 lb 6.4 oz (140.3 kg)   SpO2 98%   BMI 53.11 kg/m  BP Readings from Last 3 Encounters:  09/23/23 128/72  09/08/23 139/88  05/06/23 112/84   Wt Readings from Last 3 Encounters:  09/23/23 (!) 309 lb 6.4 oz (140.3 kg)  09/08/23 (!) 308 lb (139.7 kg)  05/06/23 (!) 305 lb 6.4 oz (138.5 kg)   SpO2 Readings from Last 3 Encounters:  09/23/23 98%  09/08/23 97%  05/06/23 97%      Physical Exam Vitals and nursing note reviewed.  Constitutional:      Appearance: Normal appearance. She is obese.  Cardiovascular:     Rate and Rhythm: Regular rhythm. Tachycardia present.     Pulses:          Dorsalis pedis pulses are 1+ on the right side and 1+ on the left side.     Heart sounds: Normal heart sounds.  Pulmonary:     Effort: Pulmonary effort is normal.     Breath sounds: Normal breath sounds.  Musculoskeletal:       Feet:  Skin:    Findings: Lesion present.  Neurological:     Mental Status: She  is alert.      No results found for any visits on 09/23/23.    The 10-year ASCVD risk score (Arnett DK, et al., 2019) is: 1.3%    Assessment & Plan:   Problem List Items Addressed This Visit       Cardiovascular and Mediastinum   Primary hypertension - Primary   Patient currently maintained on metoprolol 25 mg daily, on losartan 20 mg daily, Maxide daily.  Patient's blood pressure controlled heart rate is still elevated.  Patient states he was in the 130s at home we will increase metoprolol to 50 mg daily.  Patient has been will recheck blood pressure and pulse at home.  Follow-up in 1 month      Relevant Medications   metoprolol succinate (TOPROL-XL) 50 MG 24 hr tablet     Other   Palpitations   Has improved with addition metoprolol increased to 50 mg      Relevant Medications   metoprolol succinate (TOPROL-XL) 50 MG 24 hr tablet   Tachycardia   Increase metoprolol from 25 mg to 50 mg.       Relevant Medications   metoprolol succinate (TOPROL-XL) 50 MG 24 hr tablet   Anxiety   Patient already maintained on duloxetine 60 mg daily.  States that she still having breakthrough anxiety will add on BuSpar 5 mg twice daily.      Relevant Medications   busPIRone (BUSPAR) 5 MG tablet   Blisters of multiple sites   Was seen in urgent care and diagnosed with a viral exanthem.  Very dyshidrotic eczema the patient does not have itching.  Has improved some we will give it more time to improve on its own.  Continue lidocaine cream as prescribed from urgent care       Return in about 4 weeks (around 10/21/2023) for BP recheck/pulse recheck .    Audria Nine, NP

## 2023-09-28 ENCOUNTER — Ambulatory Visit: Payer: Self-pay | Admitting: Nurse Practitioner

## 2023-10-16 ENCOUNTER — Emergency Department (HOSPITAL_COMMUNITY)
Admission: EM | Admit: 2023-10-16 | Discharge: 2023-10-16 | Disposition: A | Discharge status: Home / Self Care | Payer: Self-pay | Attending: Emergency Medicine | Admitting: Emergency Medicine

## 2023-10-16 ENCOUNTER — Other Ambulatory Visit: Payer: Self-pay

## 2023-10-16 ENCOUNTER — Encounter (HOSPITAL_COMMUNITY): Payer: Self-pay

## 2023-10-16 DIAGNOSIS — Z79899 Other long term (current) drug therapy: Secondary | ICD-10-CM | POA: Insufficient documentation

## 2023-10-16 DIAGNOSIS — I10 Essential (primary) hypertension: Secondary | ICD-10-CM | POA: Insufficient documentation

## 2023-10-16 DIAGNOSIS — D72829 Elevated white blood cell count, unspecified: Secondary | ICD-10-CM | POA: Insufficient documentation

## 2023-10-16 DIAGNOSIS — R42 Dizziness and giddiness: Secondary | ICD-10-CM | POA: Insufficient documentation

## 2023-10-16 LAB — COMPREHENSIVE METABOLIC PANEL WITH GFR
ALT: 35 U/L (ref 0–44)
AST: 28 U/L (ref 15–41)
Albumin: 3.4 g/dL — ABNORMAL LOW (ref 3.5–5.0)
Alkaline Phosphatase: 73 U/L (ref 38–126)
Anion gap: 10 (ref 5–15)
BUN: 19 mg/dL (ref 6–20)
CO2: 22 mmol/L (ref 22–32)
Calcium: 9.8 mg/dL (ref 8.9–10.3)
Chloride: 102 mmol/L (ref 98–111)
Creatinine, Ser: 0.64 mg/dL (ref 0.44–1.00)
GFR, Estimated: >60 mL/min (ref >60)
Glucose, Bld: 161 mg/dL — ABNORMAL HIGH (ref 70–99)
Potassium: 3.7 mmol/L (ref 3.5–5.1)
Sodium: 134 mmol/L — ABNORMAL LOW (ref 135–145)
Total Bilirubin: 0.5 mg/dL (ref 0.0–1.2)
Total Protein: 6.1 g/dL — ABNORMAL LOW (ref 6.5–8.1)

## 2023-10-16 LAB — CBC WITH DIFFERENTIAL/PLATELET
Abs Immature Granulocytes: 0.07 10*3/uL (ref 0.00–0.07)
Basophils Absolute: 0.1 10*3/uL (ref 0.0–0.1)
Basophils Relative: 1 %
Eosinophils Absolute: 0 10*3/uL (ref 0.0–0.5)
Eosinophils Relative: 0 %
HCT: 38.8 % (ref 36.0–46.0)
Hemoglobin: 13.4 g/dL (ref 12.0–15.0)
Immature Granulocytes: 1 %
Lymphocytes Relative: 19 %
Lymphs Abs: 2.2 10*3/uL (ref 0.7–4.0)
MCH: 32.6 pg (ref 26.0–34.0)
MCHC: 34.5 g/dL (ref 30.0–36.0)
MCV: 94.4 fL (ref 80.0–100.0)
Monocytes Absolute: 0.8 10*3/uL (ref 0.1–1.0)
Monocytes Relative: 7 %
Neutro Abs: 8.5 10*3/uL — ABNORMAL HIGH (ref 1.7–7.7)
Neutrophils Relative %: 72 %
Platelets: 350 10*3/uL (ref 150–400)
RBC: 4.11 MIL/uL (ref 3.87–5.11)
RDW: 12.8 % (ref 11.5–15.5)
WBC: 11.7 10*3/uL — ABNORMAL HIGH (ref 4.0–10.5)
nRBC: 0 % (ref 0.0–0.2)

## 2023-10-16 LAB — LIPASE, BLOOD: Lipase: 25 U/L (ref 11–51)

## 2023-10-16 MED ORDER — SODIUM CHLORIDE 0.9 % IV BOLUS
1000.0000 mL | Freq: Once | INTRAVENOUS | Status: AC
  Administered 2023-10-16: 1000 mL via INTRAVENOUS

## 2023-10-16 MED ORDER — MECLIZINE HCL 25 MG PO TABS
25.0000 mg | ORAL_TABLET | Freq: Once | ORAL | Status: AC
  Administered 2023-10-16: 25 mg via ORAL
  Filled 2023-10-16: qty 1

## 2023-10-16 MED ORDER — MECLIZINE HCL 25 MG PO TABS: 25.0000 mg | ORAL_TABLET | Freq: Three times a day (TID) | ORAL | 0 refills | Status: AC | PRN

## 2023-10-16 NOTE — ED Triage Notes (Addendum)
 Patient arrives via [facility] from home for dizziness. Emesis x few times at home but none with EMS, no falls, patient endorses dizziness at time of ED arrival. Patient endorses dizziness that woke her up from sleep, and she became very nauseous trying to reposition herself in bed. Patient is alert and oriented x4.  18 LAC 4mg  zofran  en route  EMS vitals 120/60

## 2023-10-16 NOTE — ED Provider Notes (Signed)
 Emergency Department Provider Note   I have reviewed the triage vital signs and the nursing notes.   HISTORY  Chief Complaint Dizziness   HPI Vanessa Bean is a 48 y.o. female with past history of hypertension presents to the emergency department for evaluation of acute onset vertigo.  Patient was asleep and turned over in bed when she suddenly felt severe spinning sensation with nausea and vomiting.  She was assisted to the bathroom by her husband but felt very unsteady.  She reports feeling of spinning with any head or even eye movement.  No headache, chest pain, shortness of breath.  No abdominal pain.  No fevers.  No history of vertigo in the past.  No numbness or weakness.   Past Medical History:  Diagnosis Date   History of bronchitis    History of kidney stones    Hypertension     Review of Systems  Constitutional: No fever/chills Eyes: Positive spinning sensation.  ENT: No tinnitus.  Gastrointestinal: No abdominal pain. Positive vomiting.  Neurological: Negative for headaches, focal weakness or numbness.  ____________________________________________   PHYSICAL EXAM:  VITAL SIGNS: ED Triage Vitals  Encounter Vitals Group     BP 10/16/23 0719 124/72     Pulse Rate 10/16/23 0719 94     Resp 10/16/23 0719 20     Temp 10/16/23 0719 97.9 F (36.6 C)     Temp Source 10/16/23 0719 Oral     SpO2 10/16/23 0719 99 %     Weight 10/16/23 0715 (!) 310 lb (140.6 kg)     Height 10/16/23 0715 5\' 4"  (1.626 m)   Constitutional: Alert and oriented. Patient sitting in dark room, very still, eyes closed.  Eyes: Conjunctivae are normal. PERRL.  Head: Atraumatic. Ears:  Healthy appearing ear canals and TMs bilaterally Nose: No congestion/rhinnorhea. Mouth/Throat: Mucous membranes are moist. Neck: No stridor.   Cardiovascular: Normal rate, regular rhythm. Good peripheral circulation. Grossly normal heart sounds.   Respiratory: Normal respiratory effort.  No retractions.  Lungs CTAB. Gastrointestinal: Soft and nontender. No distention.  Musculoskeletal: No lower extremity tenderness nor edema. No gross deformities of extremities. Neurologic:  Normal speech and language. No gross focal neurologic deficits are appreciated.  Skin:  Skin is warm, dry and intact. No rash noted.   ____________________________________________   LABS (all labs ordered are listed, but only abnormal results are displayed)  Labs Reviewed  COMPREHENSIVE METABOLIC PANEL WITH GFR - Abnormal; Notable for the following components:      Result Value   Sodium 134 (*)    Glucose, Bld 161 (*)    Total Protein 6.1 (*)    Albumin 3.4 (*)    All other components within normal limits  CBC WITH DIFFERENTIAL/PLATELET - Abnormal; Notable for the following components:   WBC 11.7 (*)    Neutro Abs 8.5 (*)    All other components within normal limits  LIPASE, BLOOD   ____________________________________________  EKG   EKG Interpretation Date/Time:  Saturday October 16 2023 07:20:07 EDT Ventricular Rate:  95 PR Interval:  153 QRS Duration:  87 QT Interval:  359 QTC Calculation: 452 R Axis:   41  Text Interpretation: Sinus rhythm Confirmed by Abby Hocking [phone]) on 10/16/2023 7:55:02 AM       ____________________________________________   PROCEDURES  Procedure(s) performed:   Procedures  None  ____________________________________________   INITIAL IMPRESSION / ASSESSMENT AND PLAN / ED COURSE  Pertinent labs & imaging results that were available during my care  of the patient were reviewed by me and considered in my medical decision making (see chart for details).   This patient is Presenting for Evaluation of vertigo, which does require a range of treatment options, and is a complaint that involves a high risk of morbidity and mortality.  The Differential Diagnoses include BPPV, Mnire's, posterior circulation CVA, etc.  Critical Interventions-    Medications  sodium  chloride 0.9 % bolus 1,000 mL (0 mLs Intravenous Stopped 10/16/23 0836)  meclizine  (ANTIVERT ) tablet 25 mg (25 mg Oral Given 10/16/23 0758)    Reassessment after intervention: symptoms improved.    I did obtain Additional Historical Information from husband at bedside.    Clinical Laboratory Tests Ordered, included CBC with mild leukocytosis. No AKI. Normal Lipase.   Radiologic Tests: Considered near imaging but no exam findings to strongly suspect central vertigo cause.   Cardiac Monitor Tracing which shows NSR.    Social Determinants of Health Risk patient is a non-smoker.   Medical Decision Making: Summary:  The patient presents emergency department with acute onset vertigo symptoms after turning over in bed.  Seems peripheral in nature with reassuring history without significant red flags.  Patient fairly uncomfortable on my initial assessment.  Will need meclizine , fluids, screening blood work and reassess with more complete neuroexam.  No severe headaches or obvious neuro deficits at this time to suspect central vertigo cause.   Reevaluation with update and discussion with patient. Feeling much better on reassessment. Ambulatory. Stable for discharge with meclizine  and ENT follow up plan.   Patient's presentation is most consistent with acute presentation with potential threat to life or bodily function.   Disposition: discharge  ____________________________________________  FINAL CLINICAL IMPRESSION(S) / ED DIAGNOSES  Final diagnoses:  Vertigo     NEW OUTPATIENT MEDICATIONS STARTED DURING THIS VISIT:  Discharge Medication List as of 10/16/2023  9:13 AM     START taking these medications   Details  meclizine  (ANTIVERT ) 25 MG tablet Take 1 tablet (25 mg total) by mouth 3 (three) times daily as needed for dizziness., Starting Sat 10/16/2023, Normal        Note:  This document was prepared using Dragon voice recognition software and may include unintentional dictation  errors.  Abby Hocking, MD, [facility] Emergency Medicine    Danny Yackley, Shereen Dike, MD 10/27/23 [phone]

## 2023-10-16 NOTE — ED Notes (Signed)
 Patient discharged by this RN. Patient verbalizes understanding.

## 2023-10-16 NOTE — Discharge Instructions (Signed)
 You were seen in the emergency department today with vertigo.  This appears to be coming from your inner ear being off balance.  The medication can help if you develop additional symptoms.  I have attached some information on this paperwork describing vertigo and contact information for the ENT, should symptoms return.   If you develop any other severe symptoms that do not go away or develop weakness, numbness, or change in speech/vision you should call 911.

## 2023-10-16 NOTE — ED Notes (Signed)
 EDP at bedside

## 2023-10-22 ENCOUNTER — Ambulatory Visit (INDEPENDENT_AMBULATORY_CARE_PROVIDER_SITE_OTHER)
Admission: RE | Admit: 2023-10-22 | Discharge: 2023-10-22 | Disposition: A | Discharge status: Home / Self Care | Payer: Self-pay | Source: Ambulatory Visit | Attending: Nurse Practitioner | Admitting: Nurse Practitioner

## 2023-10-22 ENCOUNTER — Ambulatory Visit (INDEPENDENT_AMBULATORY_CARE_PROVIDER_SITE_OTHER): Payer: Self-pay | Admitting: Nurse Practitioner

## 2023-10-22 VITALS — BP 136/80 | HR 105 | Temp 97.9°F | Ht 64.0 in | Wt 313.6 lb

## 2023-10-22 DIAGNOSIS — I10 Essential (primary) hypertension: Secondary | ICD-10-CM

## 2023-10-22 DIAGNOSIS — M25571 Pain in right ankle and joints of right foot: Secondary | ICD-10-CM

## 2023-10-22 DIAGNOSIS — R000 Tachycardia, unspecified: Secondary | ICD-10-CM

## 2023-10-22 DIAGNOSIS — F419 Anxiety disorder, unspecified: Secondary | ICD-10-CM

## 2023-10-22 DIAGNOSIS — L819 Disorder of pigmentation, unspecified: Secondary | ICD-10-CM | POA: Insufficient documentation

## 2023-10-22 MED ORDER — BUSPIRONE HCL 10 MG PO TABS: 10.0000 mg | ORAL_TABLET | Freq: Two times a day (BID) | ORAL | 1 refills | Status: DC

## 2023-10-22 NOTE — Patient Instructions (Signed)
 Nice to see you today I will be in touch with the xrays once I have them  Follow up with me in 3 months, sooner if you need me

## 2023-10-22 NOTE — Assessment & Plan Note (Signed)
 History of injury to same ankle.  Recent injury I did do a rough review of x-ray do see old fracture unsure if there is a new fracture within that.  Patient is ambulatory with improving symptomology.  Signs and symptoms reviewed with patient when to be seen over the weekend.  Did staff over read of x-ray.  Continues an ice and over-the-counter analgesics as needed

## 2023-10-22 NOTE — Assessment & Plan Note (Signed)
 Patient currently maintained on olmesartan  20 mg, Maxide, metoprolol  50 mg daily.  Patient's blood pressure within normal limits.  Pulse almost within normal limits.  Patient does monitor pulse at home and has been under 100 the majority of the time.  Patient tolerating medications well.  Continue medication as prescribed

## 2023-10-22 NOTE — Assessment & Plan Note (Signed)
 History of the same.  Patient is currently maintained on duloxetine  60 mg daily and BuSpar  5 mg twice daily.  States that she has had some improvement but not where she feels like she needs.  Will increase BuSpar  to 10 mg twice daily.  If not adequate relief with that medication consider switching her SNRI.

## 2023-10-22 NOTE — Assessment & Plan Note (Signed)
 Since being on metoprolol  50 mg daily patient's tachycardia has mostly resolved.  Continue metoprolol  2 mg daily

## 2023-10-22 NOTE — Assessment & Plan Note (Signed)
 Right lower extremity that is better with elevation of foot.  Patient did have a viral exanthem on that foot previously.  Palpable DP and PT pulses.  Cap refill less than 2 seconds.  In which referral to podiatry for further evaluation signs and symptoms reviewed when to seek emergent health care

## 2023-10-22 NOTE — Progress Notes (Signed)
 Established Patient Office Visit  Subjective   Patient ID: Vanessa Bean, female    DOB: July 12, 1975  Age: 48 y.o. MRN: [id number]  Chief Complaint  Patient presents with   Follow-up    BP recheck and hospital follow up for dizziness. Pt wants to know if she needs to see an ENT for vertigo.     HPI  HTN: patient was seen by me on 09/23/2023 for htn. She was on metoprolol  25mg  daily, omlesartan, and mexzide. She was normotensive but tachycaridc. We did increase the metoprolol  to 50mg  daily . States that sh eis doing well on the increase of the medicaotn  States that the pulse has been under 100 at home   ED follow up : was seen on 10/16/2023 for vertigo. She was asleep and turned over in bed when she suddenlty felt spinning with n/v. She was given fluids and meclizine . States that she had one more episode and did take the meclizine  once and it helped.   Ankle injury: state sthat she inried Monday or Tuesday. Se hit her ankle on the plastic staris and immediate pain. She is swollen and bruised. Hurst when she walks and with touch. States she has tried tylenol  ad ibuprofen . States that she has done Ice packs     Review of Systems  Constitutional:  Negative for chills and fever.  Respiratory:  Negative for shortness of breath.   Cardiovascular:  Negative for chest pain.  Skin:        Color changes   Neurological:  Negative for tingling, weakness and headaches.  Psychiatric/Behavioral:  Negative for hallucinations and suicidal ideas.       Objective:     BP 136/80   Pulse (!) 105   Temp 97.9 F (36.6 C) (Oral)   Ht 5\' 4"  (1.626 m)   Wt (!) 313 lb 9.6 oz (142.2 kg)   LMP 09/30/2023   SpO2 94%   BMI 53.83 kg/m  BP Readings from Last 3 Encounters:  10/22/23 136/80  10/16/23 124/72  09/23/23 128/72   Wt Readings from Last 3 Encounters:  10/22/23 (!) 313 lb 9.6 oz (142.2 kg)  10/16/23 (!) 310 lb (140.6 kg)  09/23/23 (!) 309 lb 6.4 oz (140.3 kg)   SpO2 Readings from  Last 3 Encounters:  10/22/23 94%  10/16/23 99%  09/23/23 98%      Physical Exam Vitals and nursing note reviewed.  Constitutional:      Appearance: Normal appearance.  Cardiovascular:     Rate and Rhythm: Regular rhythm. Tachycardia present.     Pulses: Normal pulses.     Heart sounds: Normal heart sounds.  Pulmonary:     Effort: Pulmonary effort is normal.     Breath sounds: Normal breath sounds.  Musculoskeletal:     Right lower leg: No edema.     Left lower leg: No edema.  Skin:    Capillary Refill: Capillary refill takes less than 2 seconds.  Neurological:     Mental Status: She is alert.      No results found for any visits on 10/22/23.    The 10-year ASCVD risk score (Arnett DK, et al., 2019) is: 1.5%    Assessment & Plan:   Problem List Items Addressed This Visit       Cardiovascular and Mediastinum   Primary hypertension   Patient currently maintained on olmesartan  20 mg, Maxide, metoprolol  50 mg daily.  Patient's blood pressure within normal limits.  Pulse almost within normal  limits.  Patient does monitor pulse at home and has been under 100 the majority of the time.  Patient tolerating medications well.  Continue medication as prescribed        Musculoskeletal and Integument   Discoloration of skin of foot - Primary   Right lower extremity that is better with elevation of foot.  Patient did have a viral exanthem on that foot previously.  Palpable DP and PT pulses.  Cap refill less than 2 seconds.  In which referral to podiatry for further evaluation signs and symptoms reviewed when to seek emergent health care      Relevant Orders   Ambulatory referral to Podiatry     Other   Tachycardia   Since being on metoprolol  50 mg daily patient's tachycardia has mostly resolved.  Continue metoprolol  2 mg daily      Anxiety   History of the same.  Patient is currently maintained on duloxetine  60 mg daily and BuSpar  5 mg twice daily.  States that she has  had some improvement but not where she feels like she needs.  Will increase BuSpar  to 10 mg twice daily.  If not adequate relief with that medication consider switching her SNRI.      Relevant Medications   busPIRone  (BUSPAR ) 10 MG tablet   Acute right ankle pain   History of injury to same ankle.  Recent injury I did do a rough review of x-ray do see old fracture unsure if there is a new fracture within that.  Patient is ambulatory with improving symptomology.  Signs and symptoms reviewed with patient when to be seen over the weekend.  Did staff over read of x-ray.  Continues an ice and over-the-counter analgesics as needed      Relevant Orders   DG Ankle Complete Right    Return in about 3 months (around 01/21/2024) for BP recheck/GAD.    Margarie Shay, NP

## 2023-10-25 ENCOUNTER — Encounter: Payer: Self-pay | Admitting: Nurse Practitioner

## 2023-11-10 ENCOUNTER — Encounter: Payer: Self-pay | Admitting: Podiatry

## 2023-11-10 ENCOUNTER — Ambulatory Visit (INDEPENDENT_AMBULATORY_CARE_PROVIDER_SITE_OTHER): Payer: Self-pay | Admitting: Podiatry

## 2023-11-10 VITALS — Ht 64.0 in | Wt 313.0 lb

## 2023-11-10 DIAGNOSIS — I7300 Raynaud's syndrome without gangrene: Secondary | ICD-10-CM

## 2023-11-10 NOTE — Progress Notes (Signed)
  Subjective:  Patient ID: Vanessa Bean, female    DOB: 23-May-1976,  MRN: [id number]  Chief Complaint  Patient presents with   Nail Problem    Patient is here for nail discoloration bilateral    Discussed the use of AI scribe software for clinical note transcription with the patient, who gave verbal consent to proceed.  History of Present Illness Vanessa Bean is a 48 year old female who presents with red and blistered toes.  Approximately two to three months ago, she developed redness and blistering on every toe of her right foot and the first two toes of her left foot. The condition was painful, significantly affecting her ability to walk as she had to 'wobble' due to the inability to put pressure on her toes. No itching, burning, or tingling was present, but the pain was significant during ambulation.  She initially sought care at an urgent care facility where it was thought to be a viral issue that would resolve spontaneously. However, the symptoms persisted, and the toes began to appear more purple and cold over time. She notes that the toes look better today.  She has a history of a right ankle fracture in 2006, which has not healed well, causing her right foot to get cold in the winter. She has never experienced these symptoms during the spring or summer before. She recalls that the symptoms began a couple of weeks after she fell up the stairs, suspecting she might have broken her second toe at that time.  No family history of inflammatory arthritis such as lupus or rheumatoid arthritis, although she is unsure about rheumatoid arthritis. She has osteoarthritis but no other types of arthritis.      Objective:    Physical Exam VASCULAR: DP and PT pulse palpable. Foot is warm and well-perfused.Cyanosis of toes improves with warming, reactive erythema. DERMATOLOGIC: Normal skin turgor, texture, and temperature. No open lesions, rashes, or ulcerations. NEUROLOGIC: Normal sensation  to light touch and pressure. No paresthesias on examination. ORTHOPEDIC: Smooth, pain-free range of motion of all examined joints. No ecchymosis or bruising. No gross deformity. No pain to palpation.   No images are attached to the encounter.    Results     Assessment:   1. Raynaud disease without gangrene      Plan:  Patient was evaluated and treated and all questions answered.  Assessment and Plan Assessment & Plan Raynaud's syndrome Suspected Raynaud's syndrome due to symptoms of red and blistered toes, episodes of purple discoloration, and coldness. Symptoms are exacerbated by cold weather and trauma. No inflammatory arthritis, but possible association with rheumatoid arthritis. Chronic condition with risk of blisters progressing to ulcers or gangrene if untreated. No lab work needed as there is no joint pain or swelling. Treatment focuses on maintaining warmth to prevent complications. Nitro ointment is an option for severe cases with skin breakdown, but generally avoided due to potential side effects like headaches. - Advise keeping feet warm, especially during winter months. - Recommend wearing thick wool socks to maintain warmth. - Instruct to change into dry socks immediately if feet become wet. - Monitor for blisters turning into sores that do not heal and report if this occurs. - Provide information on Raynaud's syndrome.      No follow-ups on file.

## 2023-11-18 ENCOUNTER — Encounter: Payer: Self-pay | Admitting: Nurse Practitioner

## 2023-11-18 DIAGNOSIS — R002 Palpitations: Secondary | ICD-10-CM

## 2023-11-18 DIAGNOSIS — I10 Essential (primary) hypertension: Secondary | ICD-10-CM

## 2023-11-18 DIAGNOSIS — R000 Tachycardia, unspecified: Secondary | ICD-10-CM

## 2023-11-18 NOTE — Telephone Encounter (Signed)
 Left detailed voicemail for patient to call the office back if there are any questions regarding the voicemail.

## 2023-11-18 NOTE — Telephone Encounter (Signed)
 I spoke with pt;starting on 11/15/23 pt noticed her heart rate was getting higher than usual' pt said now heart rate is usually above 100 at rest; pt said if walk to restroom heart rate goes between 110 - 120. Pt does not keep log of BP or heart rate.pt cannot note any reason for increase in heart rate;nothing has changed.  no CP,SOB,H/A or vision changes. Pt said on and off still has slight lightheadedness but meclizine  helps that. Last BP 117/74 on 11/15/23. Pt taking triamterene  hydrochlorothiazide  37.5-25 mg in AM, olmesartan  20 mg in AM and also taking metoprolol  50 mg in AM. Pt has not missed taking meds. Pt said she is in no distress.pt does not want to make appt but wanted Matt to know that her heart rate is staying up. Pt request my chart response after Jolan Natal reviews this note.. [address] UC and ED precautions given and pt voiced understanding.Sending note to Margarie Shay NP and [place] pool.will teams [facility] CMA also.

## 2023-11-18 NOTE — Telephone Encounter (Signed)
 Can we call and see if she has been consuming any caffeine or larger amounts of such.if she is ask her to cut that back in half if possible Also see how much fluid she is drinking. The recommendation is 64oz of water a day  Have her check the pulse and blood pressure daily over the next couple of days, record it and send it to me via mychart  Give ED and UC precautions

## 2023-11-19 NOTE — Telephone Encounter (Signed)
 Unable to reach patient by phone and left v/m requesting call back at [phone]. Sending to [facility] triage.

## 2023-11-19 NOTE — Telephone Encounter (Signed)
 Still unable to reach pt by phone and will wait on cb from pt.

## 2023-11-23 NOTE — Telephone Encounter (Signed)
 Still unable to reach pt by phone and left v/m requesting pt to mychart Winthrop Hawks NP her BP and P readings that [facility] requested on 11/18/23.Aaron Aas also advised pt to call [phone] if needed. Sending note to [place] pool.

## 2023-11-25 MED ORDER — METOPROLOL SUCCINATE ER 50 MG PO TB24: 75.0000 mg | ORAL_TABLET | Freq: Every day | ORAL | 0 refills | Status: DC

## 2023-11-25 NOTE — Addendum Note (Signed)
 Addended by: Dorothe Gaster on: 11/25/2023 01:34 PM   Modules accepted: Orders

## 2023-12-20 ENCOUNTER — Other Ambulatory Visit: Payer: Self-pay | Admitting: Nurse Practitioner

## 2023-12-20 DIAGNOSIS — F320 Major depressive disorder, single episode, mild: Secondary | ICD-10-CM

## 2023-12-27 ENCOUNTER — Emergency Department (HOSPITAL_COMMUNITY): Payer: Self-pay

## 2023-12-27 ENCOUNTER — Other Ambulatory Visit: Payer: Self-pay

## 2023-12-27 ENCOUNTER — Encounter (HOSPITAL_COMMUNITY): Payer: Self-pay

## 2023-12-27 ENCOUNTER — Inpatient Hospital Stay (HOSPITAL_COMMUNITY): Payer: Self-pay | Admitting: Certified Registered Nurse Anesthetist

## 2023-12-27 ENCOUNTER — Encounter (HOSPITAL_COMMUNITY)
Admission: EM | Disposition: A | Discharge status: Home / Self Care | Payer: Self-pay | Source: Home / Self Care | Attending: Student

## 2023-12-27 ENCOUNTER — Inpatient Hospital Stay (HOSPITAL_COMMUNITY)
Admission: EM | Admit: 2023-12-27 | Discharge: 2023-12-30 | DRG: 854 | Disposition: A | Discharge status: Home / Self Care | Payer: Self-pay | Attending: Student | Admitting: Student

## 2023-12-27 ENCOUNTER — Inpatient Hospital Stay (HOSPITAL_COMMUNITY): Payer: Self-pay

## 2023-12-27 DIAGNOSIS — Z87891 Personal history of nicotine dependence: Secondary | ICD-10-CM

## 2023-12-27 DIAGNOSIS — N201 Calculus of ureter: Secondary | ICD-10-CM

## 2023-12-27 DIAGNOSIS — I10 Essential (primary) hypertension: Secondary | ICD-10-CM | POA: Diagnosis present

## 2023-12-27 DIAGNOSIS — Z818 Family history of other mental and behavioral disorders: Secondary | ICD-10-CM

## 2023-12-27 DIAGNOSIS — N111 Chronic obstructive pyelonephritis: Secondary | ICD-10-CM

## 2023-12-27 DIAGNOSIS — Z6841 Body Mass Index (BMI) 40.0 and over, adult: Secondary | ICD-10-CM

## 2023-12-27 DIAGNOSIS — F320 Major depressive disorder, single episode, mild: Secondary | ICD-10-CM | POA: Diagnosis present

## 2023-12-27 DIAGNOSIS — E1165 Type 2 diabetes mellitus with hyperglycemia: Secondary | ICD-10-CM | POA: Diagnosis present

## 2023-12-27 DIAGNOSIS — N179 Acute kidney failure, unspecified: Secondary | ICD-10-CM | POA: Diagnosis present

## 2023-12-27 DIAGNOSIS — F419 Anxiety disorder, unspecified: Secondary | ICD-10-CM | POA: Diagnosis present

## 2023-12-27 DIAGNOSIS — Z803 Family history of malignant neoplasm of breast: Secondary | ICD-10-CM

## 2023-12-27 DIAGNOSIS — N139 Obstructive and reflux uropathy, unspecified: Secondary | ICD-10-CM

## 2023-12-27 DIAGNOSIS — E66813 Obesity, class 3: Secondary | ICD-10-CM | POA: Diagnosis present

## 2023-12-27 DIAGNOSIS — Z8249 Family history of ischemic heart disease and other diseases of the circulatory system: Secondary | ICD-10-CM

## 2023-12-27 DIAGNOSIS — R6520 Severe sepsis without septic shock: Secondary | ICD-10-CM | POA: Diagnosis present

## 2023-12-27 DIAGNOSIS — Z79899 Other long term (current) drug therapy: Secondary | ICD-10-CM

## 2023-12-27 DIAGNOSIS — N136 Pyonephrosis: Secondary | ICD-10-CM | POA: Diagnosis present

## 2023-12-27 DIAGNOSIS — A419 Sepsis, unspecified organism: Principal | ICD-10-CM | POA: Diagnosis present

## 2023-12-27 DIAGNOSIS — A4151 Sepsis due to Escherichia coli [E. coli]: Principal | ICD-10-CM | POA: Diagnosis present

## 2023-12-27 DIAGNOSIS — Z811 Family history of alcohol abuse and dependence: Secondary | ICD-10-CM

## 2023-12-27 DIAGNOSIS — E871 Hypo-osmolality and hyponatremia: Secondary | ICD-10-CM | POA: Diagnosis present

## 2023-12-27 DIAGNOSIS — Z87442 Personal history of urinary calculi: Secondary | ICD-10-CM

## 2023-12-27 DIAGNOSIS — Z833 Family history of diabetes mellitus: Secondary | ICD-10-CM

## 2023-12-27 DIAGNOSIS — E872 Acidosis, unspecified: Secondary | ICD-10-CM | POA: Diagnosis present

## 2023-12-27 DIAGNOSIS — F32A Depression, unspecified: Secondary | ICD-10-CM | POA: Diagnosis present

## 2023-12-27 HISTORY — PX: CYSTOSCOPY W/ URETERAL STENT PLACEMENT: SHX1429

## 2023-12-27 LAB — ETHANOL: Alcohol, Ethyl (B): <15 mg/dL (ref <15)

## 2023-12-27 LAB — BLOOD CULTURE ID PANEL (REFLEXED) - BCID2

## 2023-12-27 LAB — CBC
HCT: 41.1 % (ref 36.0–46.0)
Hemoglobin: 13.9 g/dL (ref 12.0–15.0)
MCH: 32.4 pg (ref 26.0–34.0)
MCHC: 33.8 g/dL (ref 30.0–36.0)
MCV: 95.8 fL (ref 80.0–100.0)
Platelets: 319 10*3/uL (ref 150–400)
RBC: 4.29 MIL/uL (ref 3.87–5.11)
RDW: 12.9 % (ref 11.5–15.5)
WBC: 17.4 10*3/uL — ABNORMAL HIGH (ref 4.0–10.5)
nRBC: 0 % (ref 0.0–0.2)

## 2023-12-27 LAB — COMPREHENSIVE METABOLIC PANEL WITH GFR
ALT: 41 U/L (ref 0–44)
AST: 38 U/L (ref 15–41)
Albumin: 3.6 g/dL (ref 3.5–5.0)
Alkaline Phosphatase: 67 U/L (ref 38–126)
Anion gap: 15 (ref 5–15)
BUN: 18 mg/dL (ref 6–20)
CO2: 17 mmol/L — ABNORMAL LOW (ref 22–32)
Calcium: 9.8 mg/dL (ref 8.9–10.3)
Chloride: 101 mmol/L (ref 98–111)
Creatinine, Ser: 0.96 mg/dL (ref 0.44–1.00)
GFR, Estimated: >60 mL/min (ref >60)
Glucose, Bld: 190 mg/dL — ABNORMAL HIGH (ref 70–99)
Potassium: 3.9 mmol/L (ref 3.5–5.1)
Sodium: 133 mmol/L — ABNORMAL LOW (ref 135–145)
Total Bilirubin: 0.5 mg/dL (ref 0.0–1.2)
Total Protein: 6.6 g/dL (ref 6.5–8.1)

## 2023-12-27 LAB — URINALYSIS, ROUTINE W REFLEX MICROSCOPIC
Bilirubin Urine: NEGATIVE
Glucose, UA: NEGATIVE mg/dL
Ketones, ur: NEGATIVE mg/dL
Nitrite: POSITIVE — AB
Protein, ur: 30 mg/dL — AB
Specific Gravity, Urine: 1.011 (ref 1.005–1.030)
pH: 6 (ref 5.0–8.0)

## 2023-12-27 LAB — I-STAT CG4 LACTIC ACID, ED
Lactic Acid, Venous: 4.2 mmol/L (ref 0.5–1.9)
Lactic Acid, Venous: 3 mmol/L (ref 0.5–1.9)

## 2023-12-27 LAB — RAPID URINE DRUG SCREEN, HOSP PERFORMED
Amphetamines: NOT DETECTED
Barbiturates: NOT DETECTED
Benzodiazepines: NOT DETECTED
Cocaine: NOT DETECTED
Opiates: NOT DETECTED
Tetrahydrocannabinol: NOT DETECTED

## 2023-12-27 LAB — LIPASE, BLOOD: Lipase: 29 U/L (ref 11–51)

## 2023-12-27 LAB — TROPONIN I (HIGH SENSITIVITY)
Troponin I (High Sensitivity): 5 ng/L (ref <18)
Troponin I (High Sensitivity): 5 ng/L (ref <18)

## 2023-12-27 LAB — PROTIME-INR
INR: 1 (ref 0.8–1.2)
Prothrombin Time: 13.4 s (ref 11.4–15.2)

## 2023-12-27 LAB — HCG, SERUM, QUALITATIVE: Preg, Serum: NEGATIVE

## 2023-12-27 LAB — LACTIC ACID, PLASMA: Lactic Acid, Venous: 2.4 mmol/L (ref 0.5–1.9)

## 2023-12-27 LAB — HIV ANTIBODY (ROUTINE TESTING W REFLEX): HIV Screen 4th Generation wRfx: NONREACTIVE

## 2023-12-27 SURGERY — CYSTOSCOPY, WITH RETROGRADE PYELOGRAM AND URETERAL STENT INSERTION
Anesthesia: General | Laterality: Left

## 2023-12-27 MED ORDER — LACTATED RINGERS IV SOLN: INTRAVENOUS | Status: DC

## 2023-12-27 MED ORDER — ONDANSETRON HCL 4 MG/2ML IJ SOLN
4.0000 mg | Freq: Four times a day (QID) | INTRAMUSCULAR | Status: DC | PRN
  Administered 2023-12-29: 4 mg via INTRAVENOUS
  Filled 2023-12-27: qty 2

## 2023-12-27 MED ORDER — ONDANSETRON HCL 4 MG/2ML IJ SOLN
4.0000 mg | Freq: Once | INTRAMUSCULAR | Status: AC
  Administered 2023-12-27: 4 mg via INTRAVENOUS
  Filled 2023-12-27: qty 2

## 2023-12-27 MED ORDER — ONDANSETRON HCL 4 MG PO TABS: 4.0000 mg | ORAL_TABLET | Freq: Four times a day (QID) | ORAL | Status: DC | PRN

## 2023-12-27 MED ORDER — STERILE WATER FOR IRRIGATION IR SOLN
Status: DC | PRN
  Administered 2023-12-27: 1000 mL

## 2023-12-27 MED ORDER — HYDRALAZINE HCL 20 MG/ML IJ SOLN: 10.0000 mg | INTRAMUSCULAR | Status: DC | PRN

## 2023-12-27 MED ORDER — FENTANYL CITRATE (PF) 250 MCG/5ML IJ SOLN
INTRAMUSCULAR | Status: AC
  Filled 2023-12-27: qty 5

## 2023-12-27 MED ORDER — ONDANSETRON HCL 4 MG/2ML IJ SOLN
INTRAMUSCULAR | Status: DC | PRN
  Administered 2023-12-27: 4 mg via INTRAVENOUS

## 2023-12-27 MED ORDER — HYDROMORPHONE HCL 1 MG/ML IJ SOLN
0.5000 mg | Freq: Once | INTRAMUSCULAR | Status: AC
  Administered 2023-12-27: 0.5 mg via INTRAVENOUS
  Filled 2023-12-27: qty 1

## 2023-12-27 MED ORDER — DULOXETINE HCL 60 MG PO CPEP
60.0000 mg | ORAL_CAPSULE | Freq: Every day | ORAL | Status: DC
  Administered 2023-12-27 – 2023-12-30 (×4): 60 mg via ORAL
  Filled 2023-12-27 (×4): qty 1

## 2023-12-27 MED ORDER — ALBUTEROL SULFATE (2.5 MG/3ML) 0.083% IN NEBU: 2.5000 mg | INHALATION_SOLUTION | Freq: Four times a day (QID) | RESPIRATORY_TRACT | Status: DC | PRN

## 2023-12-27 MED ORDER — DEXAMETHASONE SODIUM PHOSPHATE 10 MG/ML IJ SOLN
INTRAMUSCULAR | Status: DC | PRN
  Administered 2023-12-27: 10 mg via INTRAVENOUS

## 2023-12-27 MED ORDER — LACTATED RINGERS IV SOLN: INTRAVENOUS | Status: AC

## 2023-12-27 MED ORDER — PHENYLEPHRINE 80 MCG/ML (10ML) SYRINGE FOR IV PUSH (FOR BLOOD PRESSURE SUPPORT)
PREFILLED_SYRINGE | INTRAVENOUS | Status: DC | PRN
  Administered 2023-12-27: 160 ug via INTRAVENOUS
  Administered 2023-12-27 (×2): 240 ug via INTRAVENOUS
  Administered 2023-12-27: 160 ug via INTRAVENOUS

## 2023-12-27 MED ORDER — ACETAMINOPHEN 500 MG PO TABS
1000.0000 mg | ORAL_TABLET | Freq: Once | ORAL | Status: AC
  Administered 2023-12-27: 1000 mg via ORAL
  Filled 2023-12-27: qty 2

## 2023-12-27 MED ORDER — PROPOFOL 10 MG/ML IV BOLUS
INTRAVENOUS | Status: AC
Start: 2023-12-27 — End: 2023-12-27
  Filled 2023-12-27: qty 20

## 2023-12-27 MED ORDER — SUCCINYLCHOLINE CHLORIDE 200 MG/10ML IV SOSY
PREFILLED_SYRINGE | INTRAVENOUS | Status: DC | PRN
  Administered 2023-12-27: 140 mg via INTRAVENOUS

## 2023-12-27 MED ORDER — CHLORHEXIDINE GLUCONATE 0.12 % MT SOLN
OROMUCOSAL | Status: AC
  Administered 2023-12-27: 15 mL via OROMUCOSAL
  Filled 2023-12-27: qty 15

## 2023-12-27 MED ORDER — LIDOCAINE 2% (20 MG/ML) 5 ML SYRINGE
INTRAMUSCULAR | Status: DC | PRN
  Administered 2023-12-27: 60 mg via INTRAVENOUS

## 2023-12-27 MED ORDER — SODIUM CHLORIDE 0.9 % IV SOLN
2.0000 g | Freq: Once | INTRAVENOUS | Status: AC
  Administered 2023-12-27: 2 g via INTRAVENOUS
  Filled 2023-12-27: qty 20

## 2023-12-27 MED ORDER — IOHEXOL 300 MG/ML  SOLN
INTRAMUSCULAR | Status: DC | PRN
  Administered 2023-12-27: 5 mL

## 2023-12-27 MED ORDER — ACETAMINOPHEN 325 MG PO TABS
650.0000 mg | ORAL_TABLET | Freq: Four times a day (QID) | ORAL | Status: DC | PRN
  Administered 2023-12-27 – 2023-12-28 (×2): 650 mg via ORAL
  Filled 2023-12-27 (×2): qty 2

## 2023-12-27 MED ORDER — ACETAMINOPHEN 650 MG RE SUPP: 650.0000 mg | Freq: Four times a day (QID) | RECTAL | Status: DC | PRN

## 2023-12-27 MED ORDER — SODIUM CHLORIDE 0.9% FLUSH
3.0000 mL | Freq: Two times a day (BID) | INTRAVENOUS | Status: DC
  Administered 2023-12-27 – 2023-12-30 (×7): 3 mL via INTRAVENOUS

## 2023-12-27 MED ORDER — FENTANYL CITRATE PF 50 MCG/ML IJ SOSY
50.0000 ug | PREFILLED_SYRINGE | Freq: Once | INTRAMUSCULAR | Status: DC
  Filled 2023-12-27: qty 1

## 2023-12-27 MED ORDER — MIDAZOLAM HCL 2 MG/2ML IJ SOLN
INTRAMUSCULAR | Status: AC
Start: 2023-12-27 — End: 2023-12-27
  Filled 2023-12-27: qty 2

## 2023-12-27 MED ORDER — OXYCODONE-ACETAMINOPHEN 5-325 MG PO TABS
1.0000 | ORAL_TABLET | Freq: Four times a day (QID) | ORAL | Status: DC | PRN
  Administered 2023-12-27 – 2023-12-28 (×2): 1 via ORAL
  Filled 2023-12-27 (×3): qty 1

## 2023-12-27 MED ORDER — ORAL CARE MOUTH RINSE: 15.0000 mL | Freq: Once | OROMUCOSAL | Status: AC

## 2023-12-27 MED ORDER — MIDAZOLAM HCL 5 MG/5ML IJ SOLN
INTRAMUSCULAR | Status: DC | PRN
  Administered 2023-12-27: 2 mg via INTRAVENOUS

## 2023-12-27 MED ORDER — AMISULPRIDE (ANTIEMETIC) 5 MG/2ML IV SOLN: 10.0000 mg | Freq: Once | INTRAVENOUS | Status: DC | PRN

## 2023-12-27 MED ORDER — LIDOCAINE HCL URETHRAL/MUCOSAL 2 % EX GEL
CUTANEOUS | Status: AC
Start: 2023-12-27 — End: 2023-12-27
  Filled 2023-12-27: qty 11

## 2023-12-27 MED ORDER — IOHEXOL 350 MG/ML SOLN
75.0000 mL | Freq: Once | INTRAVENOUS | Status: AC | PRN
  Administered 2023-12-27: 75 mL via INTRAVENOUS

## 2023-12-27 MED ORDER — HYDROMORPHONE HCL 1 MG/ML IJ SOLN
0.5000 mg | INTRAMUSCULAR | Status: DC | PRN
  Administered 2023-12-29 – 2023-12-30 (×4): 0.5 mg via INTRAVENOUS
  Filled 2023-12-27 (×4): qty 0.5

## 2023-12-27 MED ORDER — LACTATED RINGERS IV BOLUS (SEPSIS)
1000.0000 mL | Freq: Once | INTRAVENOUS | Status: AC
  Administered 2023-12-27: 1000 mL via INTRAVENOUS

## 2023-12-27 MED ORDER — LIDOCAINE HCL 2 % EX GEL
CUTANEOUS | Status: DC | PRN
  Administered 2023-12-27: 1

## 2023-12-27 MED ORDER — SODIUM CHLORIDE 0.9 % IR SOLN
Status: DC | PRN
  Administered 2023-12-27: 3000 mL
  Administered 2023-12-27: 1000 mL

## 2023-12-27 MED ORDER — FENTANYL CITRATE (PF) 100 MCG/2ML IJ SOLN: 25.0000 ug | INTRAMUSCULAR | Status: DC | PRN

## 2023-12-27 MED ORDER — VASOPRESSIN 20 UNIT/ML IV SOLN
INTRAVENOUS | Status: DC | PRN
Start: 2023-12-27 — End: 2023-12-27
  Administered 2023-12-27: 2 [IU] via INTRAVENOUS

## 2023-12-27 MED ORDER — PROCHLORPERAZINE EDISYLATE 10 MG/2ML IJ SOLN
10.0000 mg | Freq: Once | INTRAMUSCULAR | Status: AC
  Administered 2023-12-27: 10 mg via INTRAVENOUS
  Filled 2023-12-27: qty 2

## 2023-12-27 MED ORDER — CHLORHEXIDINE GLUCONATE 0.12 % MT SOLN: 15.0000 mL | Freq: Once | OROMUCOSAL | Status: AC

## 2023-12-27 MED ORDER — LACTATED RINGERS IV BOLUS
1000.0000 mL | Freq: Once | INTRAVENOUS | Status: AC
  Administered 2023-12-27: 1000 mL via INTRAVENOUS

## 2023-12-27 MED ORDER — FENTANYL CITRATE (PF) 250 MCG/5ML IJ SOLN
INTRAMUSCULAR | Status: DC | PRN
  Administered 2023-12-27: 50 ug via INTRAVENOUS

## 2023-12-27 MED ORDER — BUSPIRONE HCL 10 MG PO TABS
10.0000 mg | ORAL_TABLET | Freq: Two times a day (BID) | ORAL | Status: DC
  Administered 2023-12-27 – 2023-12-30 (×7): 10 mg via ORAL
  Filled 2023-12-27 (×7): qty 1

## 2023-12-27 MED ORDER — ENOXAPARIN SODIUM 80 MG/0.8ML IJ SOSY
70.0000 mg | PREFILLED_SYRINGE | INTRAMUSCULAR | Status: DC
  Administered 2023-12-27 – 2023-12-29 (×3): 70 mg via SUBCUTANEOUS
  Filled 2023-12-27 (×3): qty 0.7

## 2023-12-27 MED ORDER — PHENYLEPHRINE HCL-NACL 20-0.9 MG/250ML-% IV SOLN
INTRAVENOUS | Status: DC | PRN
  Administered 2023-12-27: 30 ug/min via INTRAVENOUS

## 2023-12-27 MED ORDER — VASOPRESSIN 20 UNIT/ML IV SOLN
INTRAVENOUS | Status: AC
  Filled 2023-12-27: qty 1

## 2023-12-27 MED ORDER — SODIUM CHLORIDE 0.9 % IV SOLN
2.0000 g | INTRAVENOUS | Status: DC
  Administered 2023-12-28 – 2023-12-30 (×3): 2 g via INTRAVENOUS
  Filled 2023-12-27 (×3): qty 20

## 2023-12-27 MED ORDER — METRONIDAZOLE 500 MG/100ML IV SOLN
500.0000 mg | Freq: Once | INTRAVENOUS | Status: AC
  Administered 2023-12-27: 500 mg via INTRAVENOUS
  Filled 2023-12-27: qty 100

## 2023-12-27 MED ORDER — PROPOFOL 10 MG/ML IV BOLUS
INTRAVENOUS | Status: DC | PRN
  Administered 2023-12-27: 200 mg via INTRAVENOUS

## 2023-12-27 SURGICAL SUPPLY — 14 items
BAG DRAIN URO-CYSTO SKYTR STRL (DRAIN) IMPLANT
BAG URO CATCHER STRL LF (MISCELLANEOUS) ×2 IMPLANT
CATH URETL OPEN END 6FR 70 (CATHETERS) ×2 IMPLANT
GLOVE BIO SURGEON STRL SZ7 (GLOVE) ×2 IMPLANT
GOWN STRL REUS W/ TWL LRG LVL3 (GOWN DISPOSABLE) ×2 IMPLANT
KIT TURNOVER KIT B (KITS) ×2 IMPLANT
MANIFOLD NEPTUNE II (INSTRUMENTS) ×2 IMPLANT
NS IRRIG 1000ML POUR BTL (IV SOLUTION) IMPLANT
PACK CYSTO (CUSTOM PROCEDURE TRAY) ×2 IMPLANT
SOL .9 NS 3000ML IRR UROMATIC (IV SOLUTION) ×4 IMPLANT
STENT URET 6FRX24 CONTOUR (STENTS) IMPLANT
TUBE CONNECTING 12X1/4 (SUCTIONS) IMPLANT
TUBE CONNECTING 20X1/4 (TUBING) IMPLANT
TUBING UROLOGY SET (TUBING) ×2 IMPLANT

## 2023-12-27 NOTE — Op Note (Signed)
 Operative Note  Preoperative diagnosis:  1.  Left obstructing ureteral stone 2. sepsis  Postoperative diagnosis: 1.  same  Procedure(s): 1.  Cystoscopy with left  ureteral stent placement 2. left retrograde pyelogram with interpretation 3. Fluoroscopy <1 hour with intraoperative interpretation  Surgeon: Herlene Foot, MD  Assistants:  None  Anesthesia:  General  Complications:  None  EBL:  minimal  Specimens: 1. None  Drains/Catheters: 1.  Left 6Fr x 24cm ureteral stent  Intraoperative findings:   Cystoscopy demonstrated no suspicious lesions, masses, stones or other pathology. Left Retrograde pyelogram demonstrated proximal hydronephrosis. Successful left ureteral stent placement with curl in the renal pelvis and bladder respectively.  Indication:  Vanessa Bean is a 48 y.o. female with above dx. After reviewing the management options for treatment, she elected to proceed with the above surgical procedure(s). We have discussed the potential benefits and risks of the procedure, side effects of the proposed treatment, the likelihood of the patient achieving the goals of the procedure, and any potential problems that might occur during the procedure or recuperation. Informed consent has been obtained.  Description of procedure: The patient was taken to the operating room and general anesthesia was induced.  The patient was placed in the dorsal lithotomy position, prepped and draped in the usual sterile fashion, and preoperative antibiotics were administered. A preoperative time-out was performed.   Cystourethroscopy was performed.  The patient's urethra was examined and was normal. The bladder was then systematically examined in its entirety. There was no evidence for any bladder tumors, stones, or other mucosal pathology.    Attention then turned to the left ureteral orifice. A 0.038 zip wire was passed through the left orifice and over the wire a 5 Fr open ended catheter was  inserted and passed up to the level of the renal pelvis. Omnipaque  contrast was injected through the ureteral catheter and a retrograde pyelogram was performed with findings as dictated above. The wire was then replaced and the open ended catheter was removed.   A 6Fr x 2cm ureteral stent was advance over the wire. The stent was positioned appropriately under fluoroscopic and cystoscopic guidance.  The wire was then removed with an adequate stent curl noted in the renal pelvis as well as in the bladder.  The bladder was then emptied and the procedure ended.  The patient appeared to tolerate the procedure well and without complications.  The patient was able to be awakened and transferred to the recovery unit in satisfactory condition.   Plan:  to floor as planned. Will arrange for URS as outpatient once over acute issues Urology following  Vanessa Bean Herlene Foot MD [facility]  Pager: [phone]

## 2023-12-27 NOTE — ED Notes (Signed)
 Pt requests pain medicine before any other medicine Pt educated on the importance of antibiotics in sepsis

## 2023-12-27 NOTE — Anesthesia Preprocedure Evaluation (Signed)
 Anesthesia Evaluation  Patient identified by MRN, date of birth, ID band Patient awake    Reviewed: Allergy & Precautions, NPO status , Patient's Chart, lab work & pertinent test results  Airway Mallampati: III  TM Distance: >3 FB Neck ROM: Full    Dental  (+) Dental Advisory Given   Pulmonary shortness of breath, former smoker   breath sounds clear to auscultation       Cardiovascular hypertension, Pt. on medications and Pt. on home beta blockers  Rhythm:Regular Rate:Normal     Neuro/Psych negative neurological ROS     GI/Hepatic negative GI ROS, Neg liver ROS,,,  Endo/Other    Class 4 obesity  Renal/GU Renal disease     Musculoskeletal   Abdominal   Peds  Hematology negative hematology ROS (+)   Anesthesia Other Findings   Reproductive/Obstetrics                             Anesthesia Physical Anesthesia Plan  ASA: 3 and emergent  Anesthesia Plan: General   Post-op Pain Management:    Induction: Intravenous and Rapid sequence  PONV Risk Score and Plan: 3 and Dexamethasone , Ondansetron , Treatment may vary due to age or medical condition and Midazolam   Airway Management Planned: Oral ETT  Additional Equipment:   Intra-op Plan:   Post-operative Plan: Extubation in OR  Informed Consent: I have reviewed the patients History and Physical, chart, labs and discussed the procedure including the risks, benefits and alternatives for the proposed anesthesia with the patient or authorized representative who has indicated his/her understanding and acceptance.     Dental advisory given  Plan Discussed with: CRNA  Anesthesia Plan Comments:        Anesthesia Quick Evaluation

## 2023-12-27 NOTE — Progress Notes (Signed)
 PHARMACY - PHYSICIAN COMMUNICATION CRITICAL VALUE ALERT - BLOOD CULTURE IDENTIFICATION (BCID)  Vanessa Bean is an 48 y.o. female who presented to [facility] on 12/27/2023 with a chief complaint of severe vomiting and abdominal pain found to have sepsis secondary to obstructive pyelonephritis.   Assessment:  1/4 blood cultures positive for E Coli no resistance genes detected   Name of physician (or Provider) Contacted: Maximino Sharps, MD  Current antibiotics: Rocephin    Changes to prescribed antibiotics recommended:  Patient is on recommended antibiotics - No changes needed  No results found for this or any previous visit.  Rankin Sams 12/27/2023  6:26 PM

## 2023-12-27 NOTE — H&P (Signed)
 History and Physical    Patient: Vanessa Bean FMW:[id number] DOB: [birth year] DOA: 12/27/2023 DOS: the patient was seen and examined on 12/27/2023 PCP: Wendee Lynwood HERO, NP  Patient coming from: Home  Chief Complaint:  Chief Complaint  Patient presents with   Emesis   Abdominal Pain   HPI: Vanessa Bean is a 48 y.o. female with medical history significant of hypertension, nephrolithiasis, anxiety, depression, and obesity presents with severe vomiting and abdominal pain. She is accompanied by her husband.  She has been experiencing severe vomiting for about three to four hours, starting last night around 12:30 AM.  Emesis was not bloody in appearance.  The abdominal pain is located on the left side and does not radiate.  She mentions having a fever that reached approximately 100F, followed by sweating.  Patient reported that she had also been having discomfort with urinating.  She has experienced similar symptoms in the past, which she initially thought were due to a urinary tract infection.  She has a history of kidney stones and recalls passing a stone an hour before a scheduled procedure back in 2024  She has a history of high blood pressure and is currently on medication for it. She also takes medication for anxiety and depression.  Regarding bowel movements, she had small ones yesterday and initially thought constipation was the issue. She does not smoke or drink alcohol on a daily basis.  Prior to this patient does admit that she had been celebrating her birthday with cupcakes and had 2 drinks of liquor prior to onset of symptoms.  In the ED was noted to be febrile up to 100.6 F with tachycardia and tachypnea meeting SIRS criteria.  Blood pressures noted to be as low as 106/56 and O2 saturation maintained on room air.  Labs significant for WBC 17.4, sodium 133, CO2 17, glucose 190, anion gap 15, and lactic acid 4.2-> 3.  Chest x-ray noted no acute abnormality.  Urinalysis was  significant for moderate hemoglobin, large leukocytes, positive nitrites, rare bacteria, 21-50 RBCs/hpf, and 21-50 WBCs.  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  Blood and urine cultures had been obtained.  Sepsis protocol initiated.  Patient had been given Tylenol  1000 mg p.o., 2 L of lactated Ringer's, Compazine, Zofran , Rocephin , and metronidazole.  Review of Systems: As mentioned in the history of present illness. All other systems reviewed and are negative. Past Medical History:  Diagnosis Date   History of bronchitis    History of kidney stones    Hypertension    Past Surgical History:  Procedure Laterality Date   ANKLE FRACTURE SURGERY Right    CESAREAN SECTION     CHOLECYSTECTOMY     CYSTOSCOPY W/ RETROGRADES Right 11/07/2019   Procedure: CYSTOSCOPY RIGHT URETERAL STENT PLACEMENT WITH RETROGRADE PYELOGRAM;  Surgeon: Watt Rush, MD;  Location: [facility];  Service: Urology;  Laterality: Right;   CYSTOSCOPY/URETEROSCOPY/HOLMIUM LASER/STENT PLACEMENT Right 11/23/2019   Procedure: RIGHT URETEROSCOPY//STENT EXCHANGE;  Surgeon: Watt Rush, MD;  Location: [facility];  Service: Urology;  Laterality: Right;   DILATION AND CURETTAGE OF UTERUS     Social History:  reports that she quit smoking about 10 years ago. Her smoking use included cigarettes. She has never used smokeless tobacco. She reports that she  does not currently use alcohol. She reports that she does not use drugs.  No Known Allergies  Family History  Problem Relation Age of Onset   Heart disease Mother        CHF   Hypertension Mother    Diabetes Mother    Alcohol abuse Father    Schizophrenia Father    Bipolar disorder Father    Diabetes Maternal Grandmother     Cancer Maternal Grandmother        Breast cancer    Prior to Admission medications   Medication Sig Start Date End Date Taking? Authorizing Provider  acetaminophen  (TYLENOL ) 500 MG tablet Take 1,000 mg by mouth every 6 (six) hours as needed for mild pain (pain score 1-3), moderate pain (pain score 4-6), fever or headache.   Yes [provider]  busPIRone  (BUSPAR ) 10 MG tablet Take 1 tablet (10 mg total) by mouth 2 (two) times daily. 10/22/23  Yes Wendee Lynwood HERO, NP  D-Mannose 500 MG CAPS Take 500 mg by mouth daily.   Yes [provider]  DULoxetine  (CYMBALTA ) 60 MG capsule Take 1 capsule by mouth once daily 12/20/23  Yes Wendee Lynwood HERO, NP  meclizine  (ANTIVERT ) 25 MG tablet Take 1 tablet (25 mg total) by mouth 3 (three) times daily as needed for dizziness. 10/16/23  Yes Long, Fonda MATSU, MD  metoprolol  succinate (TOPROL -XL) 50 MG 24 hr tablet Take 1.5 tablets (75 mg total) by mouth daily. Take with or immediately following a meal. 11/25/23  Yes Wendee Lynwood HERO, NP  Multiple Vitamin (MULTIVITAMIN WITH MINERALS) TABS tablet Take 1 tablet by mouth daily.   Yes [provider]  olmesartan  (BENICAR ) 20 MG tablet Take 1 tablet (20 mg total) by mouth daily. 09/09/23  Yes Wendee Lynwood HERO, NP  triamterene -hydrochlorothiazide  (MAXZIDE -25) 37.5-25 MG tablet Take 1 tablet by mouth once daily 06/14/23  Yes Wendee Lynwood HERO, NP  valACYclovir  (VALTREX ) 1000 MG tablet Take 2 tablets (2,000 mg total) by mouth 2 (two) times daily. 06/10/23  Yes Wendee Lynwood HERO, NP    Physical Exam: Vitals:   12/27/23 0615 12/27/23 0621 12/27/23 0630 12/27/23 0747  BP: 121/66 115/60 (!) 119/51 (P) 107/60  Pulse: (!) 112 (!) 113 (!) 114 (!) (P) 103  Resp:  20  (P) 18  Temp:  99.9 F (37.7 C)  (P) 97.9 F (36.6 C)  TempSrc:  Oral  (P) Oral  SpO2: 95% 95% 95% (P) 98%  Weight:    (!) (P) 143.3 kg  Height:    (P) 5' 4 (1.626 m)    Constitutional: Obese female who appears to be in no acute distress at this  time Eyes: PERRL, lids and conjunctivae normal ENMT: Mucous membranes are moist.  Normal dentition.  Neck: normal, supple Respiratory: clear to auscultation bilaterally, no wheezing, no crackles. Normal respiratory effort. No accessory muscle use.  Cardiovascular: Mildly tachycardic. No extremity edema. 2+ pedal pulses.   Abdomen: no tenderness, no masses palpated.  Bowel sounds positive.  Musculoskeletal: no clubbing / cyanosis.   Good ROM, no contractures. Normal muscle tone.  Skin: no rashes, lesions, ulcers.  Neurologic: CN 2-12 grossly intact.   Strength 5/5 in all 4.  Psychiatric: Normal judgment and insight. Alert and oriented x 3. Normal mood.   Data Reviewed:  Reviewed labs, imaging, and pertinent records as documented.  Assessment and Plan:  Severe sepsis secondary to obstructive pyelonephritis Patient presented with complaints of nausea and vomiting with left sided abdominal pain.  Patient  was noted to be febrile up to 100.6 with tachycardia, tachypnea, and leukocytosis meeting SIRS criteria.  Urinalysis significant for large leukocytes, positive nitrites, rare bacteria, 21-50 WBCs/hpf, and 21-50 WBCs.  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  Lactic acid initially elevated up to 4 with repeat check blood and urine cultures were obtained and patient was started on empiric antibiotics of Rocephin  and metronidazole. - Admit to a progressive bed - Follow-up blood and urine culture - Continue empiric antibiotics of Rocephin  - Trend lactic acid levels - Continue IV fluids - Pain control - Tylenol  as needed for fever - Continue IV fluids - Recheck CBC tomorrow morning  Essential hypertension On admission blood  pressures noted to be soft as 106/56.  Home blood pressure regimen includes - Holding blood pressure regimen.  Reassess and likely should be able to resume home medications in AM.  Anxiety and depression - Continue Cymbalta  and buspirone   Morbid obesity BMI 54.24 kg/m  DVT prophylaxis: Lovenox  Advance Care Planning:   Code Status: Full Code    Consults: Urology Family Communication: Husband updated at bedside  Severity of Illness: The appropriate patient status for this patient is INPATIENT. Inpatient status is judged to be reasonable and necessary in order to provide the required intensity of service to ensure the patient's safety. The patient's presenting symptoms, physical exam findings, and initial radiographic and laboratory data in the context of their chronic comorbidities is felt to place them at high risk for further clinical deterioration. Furthermore, it is not anticipated that the patient will be medically stable for discharge from the hospital within 2 midnights of admission.   * I certify that at the point of admission it is my clinical judgment that the patient will require inpatient hospital care spanning beyond 2 midnights from the point of admission due to high intensity of service, high risk for further deterioration and high frequency of surveillance required.*  Author: Maximino DELENA Sharps, MD 12/27/2023 8:36 AM  For on call review [email or web address].

## 2023-12-27 NOTE — Plan of Care (Signed)
  Problem: Education: Goal: Knowledge of General Education information will improve Description: Including pain rating scale, medication(s)/side effects and non-pharmacologic comfort measures Outcome: Progressing   Problem: Clinical Measurements: Goal: Ability to maintain clinical measurements within normal limits will improve Outcome: Progressing Goal: Will remain free from infection Outcome: Progressing Goal: Diagnostic test results will improve Outcome: Progressing Goal: Respiratory complications will improve Outcome: Progressing Goal: Cardiovascular complication will be avoided Outcome: Progressing   Problem: Activity: Goal: Risk for activity intolerance will decrease Outcome: Progressing   Problem: Nutrition: Goal: Adequate nutrition will be maintained Outcome: Progressing   Problem: Coping: Goal: Level of anxiety will decrease Outcome: Progressing   Problem: Pain Managment: Goal: General experience of comfort will improve and/or be controlled Outcome: Progressing   Problem: Safety: Goal: Ability to remain free from injury will improve Outcome: Progressing

## 2023-12-27 NOTE — ED Triage Notes (Signed)
 Pt coming in complaining of non stop vomiting. Pt reports that she thinks it is a UTI . Pt reporting left sided abdominal pain 7/10. Pt is calling out in triage.

## 2023-12-27 NOTE — ED Notes (Signed)
 Patient transported to CT

## 2023-12-27 NOTE — ED Provider Notes (Addendum)
 [facility] AT [facility] Provider Note   CSN: [id number] Arrival date & time: 12/27/23  9695   Patient presents with: Emesis and Abdominal Pain  Vanessa Bean is a 48 y.o. female who presents with concern for intractable nausea since 1 AM.  She states that she was celebrating her birthday, felt she overindulged on cupcakes and alcohol, states she had 2 drinks of pumpkin spice liquor this evening.  She states that is a typical amount of alcohol for her to drink.  She says suddenly she began to have intractable nausea and vomiting with NBNB emesis and also burning with urination.  This all started around 1 AM.  Left lower abdominal pain which she rates a 7 out of 10.  Patient is thrashing around in the bed, crying out help me help me.  Vital signs are with mild tachycardia with heart rate of 108 at time my arrival to the bedside she is febrile to 100.6 F orally on intake.  She has history of sepsis secondary to infected kidney stone in 2021, has hypertension, bronchitis, history of kidney stones as well.  She also states that her vertigo began to act up about an hour ago as well.   HPI     Prior to Admission medications   Medication Sig Start Date End Date Taking? Authorizing Provider  busPIRone  (BUSPAR ) 10 MG tablet Take 1 tablet (10 mg total) by mouth 2 (two) times daily. 10/22/23   Wendee Lynwood HERO, NP  DULoxetine  (CYMBALTA ) 60 MG capsule Take 1 capsule by mouth once daily 12/20/23   Wendee Lynwood HERO, NP  lidocaine  (XYLOCAINE ) 5 % ointment Apply topically as needed. 09/20/23   [provider]  meclizine  (ANTIVERT ) 25 MG tablet Take 1 tablet (25 mg total) by mouth 3 (three) times daily as needed for dizziness. 10/16/23   Long, Fonda MATSU, MD  metoprolol  succinate (TOPROL -XL) 50 MG 24 hr tablet Take 1.5 tablets (75 mg total) by mouth daily. Take with or immediately following a meal. 11/25/23   Wendee Lynwood HERO, NP  Multiple Vitamin (MULTIVITAMIN WITH MINERALS)  TABS tablet Take 1 tablet by mouth daily.    [provider]  olmesartan  (BENICAR ) 20 MG tablet Take 1 tablet (20 mg total) by mouth daily. 09/09/23   Wendee Lynwood HERO, NP  triamterene -hydrochlorothiazide  (MAXZIDE -25) 37.5-25 MG tablet Take 1 tablet by mouth once daily 06/14/23   Wendee Lynwood HERO, NP  valACYclovir  (VALTREX ) 1000 MG tablet Take 2 tablets (2,000 mg total) by mouth 2 (two) times daily. 06/10/23   Wendee Lynwood HERO, NP    Allergies: Patient has no active allergies.    Review of Systems  Constitutional:  Positive for appetite change and fever.  HENT: Negative.    Respiratory: Negative.    Cardiovascular:  Positive for chest pain.  Gastrointestinal:  Positive for abdominal pain, nausea and vomiting. Negative for diarrhea.  Genitourinary:  Positive for dysuria.  Neurological:  Positive for light-headedness.    Updated Vital Signs BP 115/60 (BP Location: Right Arm)   Pulse (!) 113   Temp 99.9 F (37.7 C) (Oral)   Resp 20   Ht 5' 4 (1.626 m)   Wt (!) 143.3 kg   LMP  (LMP Unknown)   SpO2 95%   BMI 54.24 kg/m   Physical Exam Vitals and nursing note reviewed.  Constitutional:      Appearance: She is obese. She is not toxic-appearing.  HENT:     Head: Normocephalic and atraumatic.  Mouth/Throat:     Mouth: Mucous membranes are moist.     Pharynx: No posterior oropharyngeal erythema.   Eyes:     General:        Right eye: No discharge.        Left eye: No discharge.     Conjunctiva/sclera: Conjunctivae normal.    Cardiovascular:     Rate and Rhythm: Regular rhythm. Tachycardia present.     Pulses: Normal pulses.     Heart sounds: Normal heart sounds. No murmur heard. Pulmonary:     Effort: Pulmonary effort is normal. No respiratory distress.     Breath sounds: Normal breath sounds. No wheezing or rales.  Abdominal:     General: Bowel sounds are normal. There is no distension.     Palpations: Abdomen is soft.     Tenderness: There is abdominal  tenderness in the suprapubic area and left lower quadrant. There is no right CVA tenderness, left CVA tenderness, guarding or rebound.     Hernia: No hernia is present.   Musculoskeletal:        General: No deformity.     Cervical back: Neck supple.   Skin:    General: Skin is warm and dry.   Neurological:     Mental Status: She is alert. Mental status is at baseline.   Psychiatric:        Mood and Affect: Mood normal.     (all labs ordered are listed, but only abnormal results are displayed) Labs Reviewed  COMPREHENSIVE METABOLIC PANEL WITH GFR - Abnormal; Notable for the following components:      Result Value   Sodium 133 (*)    CO2 17 (*)    Glucose, Bld 190 (*)    All other components within normal limits  CBC - Abnormal; Notable for the following components:   WBC 17.4 (*)    All other components within normal limits  URINALYSIS, ROUTINE W REFLEX MICROSCOPIC - Abnormal; Notable for the following components:   APPearance HAZY (*)    Hgb urine dipstick MODERATE (*)    Protein, ur 30 (*)    Nitrite POSITIVE (*)    Leukocytes,Ua LARGE (*)    Bacteria, UA RARE (*)    All other components within normal limits  I-STAT CG4 LACTIC ACID, ED - Abnormal; Notable for the following components:   Lactic Acid, Venous 4.2 (*)    All other components within normal limits  I-STAT CG4 LACTIC ACID, ED - Abnormal; Notable for the following components:   Lactic Acid, Venous 3.0 (*)    All other components within normal limits  URINE CULTURE  CULTURE, BLOOD (ROUTINE X 2)  CULTURE, BLOOD (ROUTINE X 2)  LIPASE, BLOOD  HCG, SERUM, QUALITATIVE  ETHANOL  RAPID URINE DRUG SCREEN, HOSP PERFORMED  PROTIME-INR  TROPONIN I (HIGH SENSITIVITY)  TROPONIN I (HIGH SENSITIVITY)    EKG: None  Radiology: CT ABDOMEN PELVIS W CONTRAST Result Date: 12/27/2023 CLINICAL DATA:  48 year old female with possible sepsis, left abdominal pain and refractory vomiting. EXAM: CT ABDOMEN AND PELVIS WITH  CONTRAST TECHNIQUE: Multidetector CT imaging of the abdomen and pelvis was performed using the standard protocol following bolus administration of intravenous contrast. RADIATION DOSE REDUCTION: This exam was performed according to the departmental dose-optimization program which includes automated exposure control, adjustment of the mA and/or kV according to patient size and/or use of iterative reconstruction technique. CONTRAST:  75mL OMNIPAQUE  IOHEXOL  350 MG/ML SOLN COMPARISON:  CT Abdomen and Pelvis 06/29/2020. FINDINGS:  Lower chest: Normal heart size. Negative lung bases, no pericardial or pleural effusion. Hepatobiliary: Chronic cholecystectomy. Probable hepatic steatosis but otherwise negative liver. Pancreas: Mild atrophy. Spleen: Negative. Adrenals/Urinary Tract: Normal adrenal glands. Substantially progressed since 2022 right renal staghorn calculus, 2.5 cm long axis. Nonobstructed right kidney. Punctate additional right nephrolithiasis. Right renal enhancement and right ureter appear normal. Abnormal left kidney. Left nephromegaly and hydronephrosis. Obstructing adjacent stones at the left ureteropelvic junction, the larger is 8 mm diameter (coronal image 75). Delayed left nephrogram. Punctate additional intrarenal calculi on the left. Distal to the left UPJ the ureter is mildly inflamed, but tapers into the pelvis. Chronic left hemipelvis phlebolith. No distal ureteral calculus. Diminutive bladder. Stomach/Bowel: Negative large bowel, normal appendix on series 3, image 70. Nondilated small bowel. Rectus muscle diastasis, small fat containing umbilical hernia. No herniated bowel. Diminutive stomach and duodenum. No pneumoperitoneum, free fluid, mesenteric inflammation. Vascular/Lymphatic: Major arterial structures in the portal venous system are patent. Mild Calcified aortic atherosclerosis. No lymphadenopathy identified. Reproductive: Negative. Other: No pelvis free fluid. Musculoskeletal: Chronic facet  arthropathy at the lumbosacral junction. Flowing lower thoracic endplate osteophytes with some interbody ankylosis. No acute osseous abnormality identified. IMPRESSION: 1. Left Side Acute Obstructive Uropathy due to two adjacent stones lodged at the Left UPJ, the larger is 8 mm. 2. 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 Additional punctate bilateral nephrolithiasis. Electronically Signed   By: VEAR Hurst M.D.   On: 12/27/2023 05:36   DG Chest Port 1 View Result Date: 12/27/2023 CLINICAL DATA:  48 year old female with possible sepsis. Refractory vomiting. Left side abdominal pain. EXAM: PORTABLE CHEST 1 VIEW COMPARISON:  Chest CTA 08/17/2013 and earlier. FINDINGS: Portable AP semi upright view at 0356 hours. Lung volumes and mediastinal contours remain within normal limits. Visualized tracheal air column is within normal limits. Allowing for portable technique the lungs are clear. No pneumothorax or pleural effusion. No acute osseous abnormality identified. Paucity of bowel gas in the visible abdomen. IMPRESSION: Negative portable chest. Electronically Signed   By: VEAR Hurst M.D.   On: 12/27/2023 04:08     .Critical Care  Performed by: Bobette Pleasant SAUNDERS, PA-C Authorized by: Bobette Pleasant SAUNDERS, PA-C   Critical care provider statement:    Critical care time (minutes):  45   Critical care was time spent personally by me on the following activities:  Development of treatment plan with patient or surrogate, discussions with consultants, evaluation of patient's response to treatment, examination of patient, obtaining history from patient or surrogate, ordering and performing treatments and interventions, ordering and review of laboratory studies, ordering and review of radiographic studies, pulse oximetry and re-evaluation of patient's condition    Medications Ordered in the ED  lactated ringers  infusion ( Intravenous New Bag/Given 12/27/23 0627)   cefTRIAXone  (ROCEPHIN ) 2 g in sodium chloride  0.9 % 100 mL IVPB (has no administration in time range)  metroNIDAZOLE (FLAGYL) IVPB 500 mg (500 mg Intravenous New Bag/Given 12/27/23 0610)  lactated ringers  bolus 1,000 mL (has no administration in time range)  ondansetron  (ZOFRAN ) injection 4 mg (4 mg Intravenous Given 12/27/23 0414)  lactated ringers  bolus 1,000 mL (0 mLs Intravenous Stopped 12/27/23 0626)  acetaminophen  (TYLENOL ) tablet 1,000 mg (1,000 mg Oral Given 12/27/23 0555)  prochlorperazine (COMPAZINE) injection 10 mg (10 mg Intravenous Given 12/27/23 0559)  iohexol  (OMNIPAQUE ) 350 MG/ML injection 75 mL (75 mLs Intravenous Contrast Given 12/27/23 0525)  HYDROmorphone  (DILAUDID ) injection 0.5 mg (0.5 mg Intravenous Given 12/27/23 0554)  Clinical Course as of 12/27/23 9352  Mon Dec 27, 2023  0607 Consult to Dr. Carolee, urology, who will consult on the patient. Agrees with plan for admission to medicine at this time.  [RS]    Clinical Course User Index [RS] Asanti Craigo, Pleasant SAUNDERS, PA-C                                 Medical Decision Making 48 y/o female with N/V/abdominal pain.  Tachy, febrile on intake. VS otherwise reassuring. Cardiopulmonary exam is unremarkable. Abdominal exam is + for LLQ TTP.   The differential diagnosis of emergent flank pain includes, but is not limited to: Nephrolithiasis/ Renal Colic, Pyelonephritis, Abdominal aortic aneurysm, Aortic dissection, Renal artery embolism, Renal vein thrombosis, Renal infarction, Renal hemorrhage, Mesenteric ischemia, Bladder tumor, Cystitis, Biliary colic, Pancreatitis, Perforated peptic ulcer,  Appendicitis, Inguinal Hernia, Diverticulitis, Bowel obstruction. Shingles, Lower lobe pneumonia, Retroperitoneal hematoma/abscess/tumor, Epidural abscess, Epidural hematoma.  Particularly in females it is important to consider (Ectopic Pregnancy,PID/TOA,Ovarian cyst, Ovarian torsion, STD)    Amount and/or Complexity of Data Reviewed Labs:  ordered.    Details: CBC with leukocytosis of 17,000, CMP with hyponatremia 131, hyperglycemia.  Pregnancy test negative.Lactic of 4.2 then downtrending to 3 following fluid bolus.  Pregnancy is negative, urine is certainly infected.  INR is normal, UDS negative.  Ethanol is normal.   Radiology: ordered.    Details: CT abdomen pelvis with acute left-sided obstructive uropathy with 2 UPJ stones the larger of which is 8 mm.    Risk OTC drugs. Prescription drug management. Decision regarding hospitalization.   Patient required mission the hospital for sepsis secondary to infected obstructive uropathy.  Consult Dr. Carolee as above.  Consult hospitalist Dr. Charlton.    Zani voiced understanding of her medical evaluation and treatment plan. Each of their questions answered to their expressed satisfaction.   This chart was dictated using voice recognition software, Dragon. Despite the best efforts of this provider to proofread and correct errors, errors may still occur which can change documentation meaning.     Final diagnoses:  None    ED Discharge Orders     None          Bobette Pleasant SAUNDERS DEVONNA 12/27/23 0647    Rae Sutcliffe, Pleasant SAUNDERS, PA-C 12/27/23 9349    Palumbo, April, MD 12/27/23 2309

## 2023-12-27 NOTE — Transfer of Care (Signed)
 Immediate Anesthesia Transfer of Care Note  Patient: Vanessa Bean  Procedure(s) Performed: CYSTOSCOPY, WITH RETROGRADE PYELOGRAM AND URETERAL STENT INSERTION (Left)  Patient Location: PACU  Anesthesia Type:General  Level of Consciousness: drowsy  Airway & Oxygen Therapy: Patient Spontanous Breathing and Patient connected to face mask oxygen  Post-op Assessment: Report given to RN and Post -op Vital signs reviewed and stable  Post vital signs: Reviewed and stable  Last Vitals:  Vitals Value Taken Time  BP 122/67 12/27/23 09:30  Temp    Pulse 107 12/27/23 09:31  Resp 23 12/27/23 09:31  SpO2 99 % 12/27/23 09:31  Vitals shown include unfiled device data.  Last Pain:  Vitals:   12/27/23 0802  TempSrc:   PainSc: 0-No pain         Complications: No notable events documented.

## 2023-12-27 NOTE — Interval H&P Note (Signed)
 History and Physical Interval Note:  12/27/2023 8:07 AM  Vanessa Bean  has presented today for surgery, with the diagnosis of SEPSIS.  The various methods of treatment have been discussed with the patient and family. After consideration of risks, benefits and other options for treatment, the patient has consented to  Procedure(s): CYSTOSCOPY, WITH RETROGRADE PYELOGRAM AND URETERAL STENT INSERTION (Left) as a surgical intervention.  The patient's history has been reviewed, patient examined, no change in status, stable for surgery.  I have reviewed the patient's chart and labs.  Questions were answered to the patient's satisfaction.    GEN:NAD CV: increased rate, reg rhythm  RESP: CTAB   Nao Linz L Platon Arocho

## 2023-12-27 NOTE — H&P (View-Only) (Signed)
 Urology Consult Note   Requesting Attending Physician:  Charlton Evalene RAMAN, MD Service Providing Consult: Urology  Consulting Attending: Dr. Lovie   Reason for Consult:  obstructing ureteral stone, sepsis  HPI: Vanessa Bean is seen in consultation for reasons noted above at the request of Opyd, Evalene RAMAN, MD. Patient is a 48 year old female presenting to [facility] emergency department for intractable nausea/vomiting since 1 AM.  At this time she began to experience severe left lower abdominal pain.  On arrival to the emergency department she was found to be febrile, tachycardic, acidotic, with leukocytosis, and 2 obstructing left UPJ stones measuring approximately 8 and 5 mm respectively.  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 Renal function is preserved.   On arrival patient was alert, oriented, and in no distress.  She was resting on the side of the bed in the preop area.  She has had ureteral stent and obstructing stone before and was familiar with the process is had.  Her nausea presently controlled.  She is amenable to proceeding to the OR for left ureteral stent placement. ------------------  Assessment:   48 y.o. female with tachycardia, fever, UTI, lactic acidosis, and obstructing stone   Recommendations: #right non-obstructing staghorn stone #left 8 and 5mm obstructing UPJ stones #UTI #sepsis  To the OR with Dr. Lovie for left ureteral stent placement Definitive stone mgmt on an outpt basis once pt has cleared her infection. Obstructing stone and right staghorn may need to be treated sequentially.  Agree with broad ABX while awaiting sensitivities.  Urology will follow  Case and plan discussed with Dr. Lovie  Past Medical History: Past Medical History:  Diagnosis Date   History of bronchitis    History of kidney stones    Hypertension     Past Surgical History:  Past Surgical History:  Procedure Laterality Date    ANKLE FRACTURE SURGERY Right    CESAREAN SECTION     CHOLECYSTECTOMY     CYSTOSCOPY W/ RETROGRADES Right 11/07/2019   Procedure: CYSTOSCOPY RIGHT URETERAL STENT PLACEMENT WITH RETROGRADE PYELOGRAM;  Surgeon: Watt Rush, MD;  Location: [facility] ORS;  Service: Urology;  Laterality: Right;   CYSTOSCOPY/URETEROSCOPY/HOLMIUM LASER/STENT PLACEMENT Right 11/23/2019   Procedure: RIGHT URETEROSCOPY//STENT EXCHANGE;  Surgeon: Watt Rush, MD;  Location: [facility] ORS;  Service: Urology;  Laterality: Right;   DILATION AND CURETTAGE OF UTERUS      Medication: Current Facility-Administered Medications  Medication Dose Route Frequency Provider Last Rate Last Admin   cefTRIAXone  (ROCEPHIN ) 2 g in sodium chloride  0.9 % 100 mL IVPB  2 g Intravenous Once Sponseller, Rebekah R, PA-C       lactated ringers  bolus 1,000 mL  1,000 mL Intravenous Once Sponseller, Rebekah R, PA-C       lactated ringers  infusion   Intravenous Continuous Sponseller, Rebekah R, PA-C 150 mL/hr at 12/27/23 9372 New Bag at 12/27/23 0627   Current Outpatient Medications  Medication Sig Dispense Refill   acetaminophen  (TYLENOL ) 500 MG tablet Take 1,000 mg by mouth every 6 (six) hours as needed for mild pain (pain score 1-3), moderate pain (pain score 4-6), fever or headache.     busPIRone  (BUSPAR ) 10 MG tablet Take 1 tablet (10 mg total) by mouth 2 (two) times daily. 180 tablet 1   D-Mannose 500 MG CAPS Take 500 mg by mouth daily.     DULoxetine  (CYMBALTA ) 60 MG capsule Take 1 capsule by mouth once daily 90 capsule 0   meclizine  (  ANTIVERT ) 25 MG tablet Take 1 tablet (25 mg total) by mouth 3 (three) times daily as needed for dizziness. 30 tablet 0   metoprolol  succinate (TOPROL -XL) 50 MG 24 hr tablet Take 1.5 tablets (75 mg total) by mouth daily. Take with or immediately following a meal. 135 tablet 0   Multiple Vitamin (MULTIVITAMIN WITH MINERALS) TABS tablet Take 1 tablet by mouth daily.     olmesartan  (BENICAR ) 20 MG tablet Take 1 tablet (20 mg total)  by mouth daily.     triamterene -hydrochlorothiazide  (MAXZIDE -25) 37.5-25 MG tablet Take 1 tablet by mouth once daily 90 tablet 2   valACYclovir  (VALTREX ) 1000 MG tablet Take 2 tablets (2,000 mg total) by mouth 2 (two) times daily. 4 tablet 2    Allergies: No Active Allergies  Social History: Social History   Tobacco Use   Smoking status: Former    Current packs/day: 0.00    Types: Cigarettes    Quit date: 2015    Years since quitting: 10.5   Smokeless tobacco: Never  Vaping Use   Vaping status: Never Used  Substance Use Topics   Alcohol use: Not Currently    Comment: Twice a month with all 3  2-3   Drug use: No    Family History Family History  Problem Relation Age of Onset   Heart disease Mother        CHF   Hypertension Mother    Diabetes Mother    Alcohol abuse Father    Schizophrenia Father    Bipolar disorder Father    Diabetes Maternal Grandmother    Cancer Maternal Grandmother        Breast cancer    Review of Systems  Genitourinary:  Positive for flank pain. Negative for dysuria, frequency, hematuria and urgency.     Objective   Vital signs in last 24 hours: BP (!) 119/51   Pulse (!) 114   Temp 99.9 F (37.7 C) (Oral)   Resp 20   Ht 5' 4 (1.626 m)   Wt (!) 143.3 kg   LMP  (LMP Unknown)   SpO2 95%   BMI 54.24 kg/m   Physical Exam General: A&O, resting, appropriate HEENT: [place]/AT Pulmonary: Normal work of breathing Cardiovascular: no cyanosis Abdomen: Soft, NTTP, nondistended Neuro: Appropriate, no focal neurological deficits  Most Recent Labs: Lab Results  Component Value Date   WBC 17.4 (H) 12/27/2023   HGB 13.9 12/27/2023   HCT 41.1 12/27/2023   PLT 319 12/27/2023    Lab Results  Component Value Date   NA 133 (L) 12/27/2023   K 3.9 12/27/2023   CL 101 12/27/2023   CO2 17 (L) 12/27/2023   BUN 18 12/27/2023   CREATININE 0.96 12/27/2023   CALCIUM 9.8 12/27/2023    Lab Results  Component Value Date   INR 1.0 12/27/2023    APTT 29 11/07/2019     Urine Culture: @[id number] (laburin,org,[id number])@   IMAGING: CT ABDOMEN PELVIS W CONTRAST Result Date: 12/27/2023 CLINICAL DATA:  48 year old female with possible sepsis, left abdominal pain and refractory vomiting. EXAM: CT ABDOMEN AND PELVIS WITH CONTRAST TECHNIQUE: Multidetector CT imaging of the abdomen and pelvis was performed using the standard protocol following bolus administration of intravenous contrast. RADIATION DOSE REDUCTION: This exam was performed according to the departmental dose-optimization program which includes automated exposure control, adjustment of the mA and/or kV according to patient size and/or use of iterative reconstruction technique. CONTRAST:  75mL OMNIPAQUE  IOHEXOL  350 MG/ML SOLN COMPARISON:  CT Abdomen and Pelvis  06/29/2020. FINDINGS: Lower chest: Normal heart size. Negative lung bases, no pericardial or pleural effusion. Hepatobiliary: Chronic cholecystectomy. Probable hepatic steatosis but otherwise negative liver. Pancreas: Mild atrophy. Spleen: Negative. Adrenals/Urinary Tract: Normal adrenal glands. Substantially progressed since 2022 right renal staghorn calculus, 2.5 cm long axis. Nonobstructed right kidney. Punctate additional right nephrolithiasis. Right renal enhancement and right ureter appear normal. Abnormal left kidney. Left nephromegaly and hydronephrosis. Obstructing adjacent stones at the left ureteropelvic junction, the larger is 8 mm diameter (coronal image 75). Delayed left nephrogram. Punctate additional intrarenal calculi on the left. Distal to the left UPJ the ureter is mildly inflamed, but tapers into the pelvis. Chronic left hemipelvis phlebolith. No distal ureteral calculus. Diminutive bladder. Stomach/Bowel: Negative large bowel, normal appendix on series 3, image 70. Nondilated small bowel. Rectus muscle diastasis, small fat containing umbilical hernia. No herniated bowel. Diminutive stomach and duodenum. No  pneumoperitoneum, free fluid, mesenteric inflammation. Vascular/Lymphatic: Major arterial structures in the portal venous system are patent. Mild Calcified aortic atherosclerosis. No lymphadenopathy identified. Reproductive: Negative. Other: No pelvis free fluid. Musculoskeletal: Chronic facet arthropathy at the lumbosacral junction. Flowing lower thoracic endplate osteophytes with some interbody ankylosis. No acute osseous abnormality identified. IMPRESSION: 1. Left Side Acute Obstructive Uropathy due to two adjacent stones lodged at the Left UPJ, the larger is 8 mm. 2. 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 Additional punctate bilateral nephrolithiasis. Electronically Signed   By: VEAR Hurst M.D.   On: 12/27/2023 05:36   DG Chest Port 1 View Result Date: 12/27/2023 CLINICAL DATA:  48 year old female with possible sepsis. Refractory vomiting. Left side abdominal pain. EXAM: PORTABLE CHEST 1 VIEW COMPARISON:  Chest CTA 08/17/2013 and earlier. FINDINGS: Portable AP semi upright view at 0356 hours. Lung volumes and mediastinal contours remain within normal limits. Visualized tracheal air column is within normal limits. Allowing for portable technique the lungs are clear. No pneumothorax or pleural effusion. No acute osseous abnormality identified. Paucity of bowel gas in the visible abdomen. IMPRESSION: Negative portable chest. Electronically Signed   By: VEAR Hurst M.D.   On: 12/27/2023 04:08    ------  Ole Bourdon, NP Pager: [phone]   Please contact the urology consult pager with any further questions/concerns.

## 2023-12-27 NOTE — Anesthesia Procedure Notes (Signed)
 Procedure Name: Intubation Date/Time: 12/27/2023 8:48 AM  Performed by: Claudene Arlin LABOR, CRNAPre-anesthesia Checklist: Patient identified, Emergency Drugs available, Suction available and Patient being monitored Patient Re-evaluated:Patient Re-evaluated prior to induction Oxygen Delivery Method: Circle system utilized Preoxygenation: Pre-oxygenation with 100% oxygen Induction Type: IV induction Ventilation: Mask ventilation without difficulty and Two handed mask ventilation required Laryngoscope Size: Glidescope and 3 Grade View: Grade I Tube type: Oral Tube size: 7.0 mm Number of attempts: 2 Airway Equipment and Method: Rigid stylet and Video-laryngoscopy Placement Confirmation: ETT inserted through vocal cords under direct vision, positive ETCO2 and breath sounds checked- equal and bilateral Secured at: 22 cm Tube secured with: Tape Dental Injury: Teeth and Oropharynx as per pre-operative assessment  Difficulty Due To: Difficulty was anticipated and Difficult Airway- due to large tongue Comments: DL x 1 with Mil 2 and grade 3 view.  Easy BMV.  DL x 2 with glidescope 3. Successful intubation.  EBBS and VSS.

## 2023-12-27 NOTE — ED Notes (Signed)
 Main lab contacted to add ethanol, troponin, and INR

## 2023-12-27 NOTE — Consult Note (Signed)
 Urology Consult Note   Requesting Attending Physician:  Charlton Evalene RAMAN, MD Service Providing Consult: Urology  Consulting Attending: Dr. Lovie   Reason for Consult:  obstructing ureteral stone, sepsis  HPI: Vanessa Bean is seen in consultation for reasons noted above at the request of Opyd, Evalene RAMAN, MD. Patient is a 48 year old female presenting to [facility] emergency department for intractable nausea/vomiting since 1 AM.  At this time she began to experience severe left lower abdominal pain.  On arrival to the emergency department she was found to be febrile, tachycardic, acidotic, with leukocytosis, and 2 obstructing left UPJ stones measuring approximately 8 and 5 mm respectively.  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 Renal function is preserved.   On arrival patient was alert, oriented, and in no distress.  She was resting on the side of the bed in the preop area.  She has had ureteral stent and obstructing stone before and was familiar with the process is had.  Her nausea presently controlled.  She is amenable to proceeding to the OR for left ureteral stent placement. ------------------  Assessment:   48 y.o. female with tachycardia, fever, UTI, lactic acidosis, and obstructing stone   Recommendations: #right non-obstructing staghorn stone #left 8 and 5mm obstructing UPJ stones #UTI #sepsis  To the OR with Dr. Lovie for left ureteral stent placement Definitive stone mgmt on an outpt basis once pt has cleared her infection. Obstructing stone and right staghorn may need to be treated sequentially.  Agree with broad ABX while awaiting sensitivities.  Urology will follow  Case and plan discussed with Dr. Lovie  Past Medical History: Past Medical History:  Diagnosis Date   History of bronchitis    History of kidney stones    Hypertension     Past Surgical History:  Past Surgical History:  Procedure Laterality Date    ANKLE FRACTURE SURGERY Right    CESAREAN SECTION     CHOLECYSTECTOMY     CYSTOSCOPY W/ RETROGRADES Right 11/07/2019   Procedure: CYSTOSCOPY RIGHT URETERAL STENT PLACEMENT WITH RETROGRADE PYELOGRAM;  Surgeon: Watt Rush, MD;  Location: [facility];  Service: Urology;  Laterality: Right;   CYSTOSCOPY/URETEROSCOPY/HOLMIUM LASER/STENT PLACEMENT Right 11/23/2019   Procedure: RIGHT URETEROSCOPY//STENT EXCHANGE;  Surgeon: Watt Rush, MD;  Location: [facility];  Service: Urology;  Laterality: Right;   DILATION AND CURETTAGE OF UTERUS      Medication: Current Facility-Administered Medications  Medication Dose Route Frequency Provider Last Rate Last Admin   cefTRIAXone  (ROCEPHIN ) 2 g in sodium chloride  0.9 % 100 mL IVPB  2 g Intravenous Once Sponseller, Rebekah R, PA-C       lactated ringers  bolus 1,000 mL  1,000 mL Intravenous Once Sponseller, Rebekah R, PA-C       lactated ringers  infusion   Intravenous Continuous Sponseller, Rebekah R, PA-C 150 mL/hr at 12/27/23 9372 New Bag at 12/27/23 0627   Current Outpatient Medications  Medication Sig Dispense Refill   acetaminophen  (TYLENOL ) 500 MG tablet Take 1,000 mg by mouth every 6 (six) hours as needed for mild pain (pain score 1-3), moderate pain (pain score 4-6), fever or headache.     busPIRone  (BUSPAR ) 10 MG tablet Take 1 tablet (10 mg total) by mouth 2 (two) times daily. 180 tablet 1   D-Mannose 500 MG CAPS Take 500 mg by mouth daily.     DULoxetine  (CYMBALTA ) 60 MG capsule Take 1 capsule by mouth once daily 90 capsule 0   meclizine  (  ANTIVERT ) 25 MG tablet Take 1 tablet (25 mg total) by mouth 3 (three) times daily as needed for dizziness. 30 tablet 0   metoprolol  succinate (TOPROL -XL) 50 MG 24 hr tablet Take 1.5 tablets (75 mg total) by mouth daily. Take with or immediately following a meal. 135 tablet 0   Multiple Vitamin (MULTIVITAMIN WITH MINERALS) TABS tablet Take 1 tablet by mouth daily.     olmesartan  (BENICAR ) 20 MG tablet Take 1 tablet (20 mg total)  by mouth daily.     triamterene -hydrochlorothiazide  (MAXZIDE -25) 37.5-25 MG tablet Take 1 tablet by mouth once daily 90 tablet 2   valACYclovir  (VALTREX ) 1000 MG tablet Take 2 tablets (2,000 mg total) by mouth 2 (two) times daily. 4 tablet 2    Allergies: No Active Allergies  Social History: Social History   Tobacco Use   Smoking status: Former    Current packs/day: 0.00    Types: Cigarettes    Quit date: 2015    Years since quitting: 10.5   Smokeless tobacco: Never  Vaping Use   Vaping status: Never Used  Substance Use Topics   Alcohol use: Not Currently    Comment: Twice a month with all 3  2-3   Drug use: No    Family History Family History  Problem Relation Age of Onset   Heart disease Mother        CHF   Hypertension Mother    Diabetes Mother    Alcohol abuse Father    Schizophrenia Father    Bipolar disorder Father    Diabetes Maternal Grandmother    Cancer Maternal Grandmother        Breast cancer    Review of Systems  Genitourinary:  Positive for flank pain. Negative for dysuria, frequency, hematuria and urgency.     Objective   Vital signs in last 24 hours: BP (!) 119/51   Pulse (!) 114   Temp 99.9 F (37.7 C) (Oral)   Resp 20   Ht 5' 4 (1.626 m)   Wt (!) 143.3 kg   LMP  (LMP Unknown)   SpO2 95%   BMI 54.24 kg/m   Physical Exam General: A&O, resting, appropriate HEENT: [place]/AT Pulmonary: Normal work of breathing Cardiovascular: no cyanosis Abdomen: Soft, NTTP, nondistended Neuro: Appropriate, no focal neurological deficits  Most Recent Labs: Lab Results  Component Value Date   WBC 17.4 (H) 12/27/2023   HGB 13.9 12/27/2023   HCT 41.1 12/27/2023   PLT 319 12/27/2023    Lab Results  Component Value Date   NA 133 (L) 12/27/2023   K 3.9 12/27/2023   CL 101 12/27/2023   CO2 17 (L) 12/27/2023   BUN 18 12/27/2023   CREATININE 0.96 12/27/2023   CALCIUM 9.8 12/27/2023    Lab Results  Component Value Date   INR 1.0 12/27/2023    APTT 29 11/07/2019     Urine Culture: @[id number] (laburin,org,[id number],[id number])@   IMAGING: CT ABDOMEN PELVIS W CONTRAST Result Date: 12/27/2023 CLINICAL DATA:  48 year old female with possible sepsis, left abdominal pain and refractory vomiting. EXAM: CT ABDOMEN AND PELVIS WITH CONTRAST TECHNIQUE: Multidetector CT imaging of the abdomen and pelvis was performed using the standard protocol following bolus administration of intravenous contrast. RADIATION DOSE REDUCTION: This exam was performed according to the departmental dose-optimization program which includes automated exposure control, adjustment of the mA and/or kV according to patient size and/or use of iterative reconstruction technique. CONTRAST:  75mL OMNIPAQUE  IOHEXOL  350 MG/ML SOLN COMPARISON:  CT Abdomen and Pelvis  06/29/2020. FINDINGS: Lower chest: Normal heart size. Negative lung bases, no pericardial or pleural effusion. Hepatobiliary: Chronic cholecystectomy. Probable hepatic steatosis but otherwise negative liver. Pancreas: Mild atrophy. Spleen: Negative. Adrenals/Urinary Tract: Normal adrenal glands. Substantially progressed since 2022 right renal staghorn calculus, 2.5 cm long axis. Nonobstructed right kidney. Punctate additional right nephrolithiasis. Right renal enhancement and right ureter appear normal. Abnormal left kidney. Left nephromegaly and hydronephrosis. Obstructing adjacent stones at the left ureteropelvic junction, the larger is 8 mm diameter (coronal image 75). Delayed left nephrogram. Punctate additional intrarenal calculi on the left. Distal to the left UPJ the ureter is mildly inflamed, but tapers into the pelvis. Chronic left hemipelvis phlebolith. No distal ureteral calculus. Diminutive bladder. Stomach/Bowel: Negative large bowel, normal appendix on series 3, image 70. Nondilated small bowel. Rectus muscle diastasis, small fat containing umbilical hernia. No herniated bowel. Diminutive stomach and duodenum. No  pneumoperitoneum, free fluid, mesenteric inflammation. Vascular/Lymphatic: Major arterial structures in the portal venous system are patent. Mild Calcified aortic atherosclerosis. No lymphadenopathy identified. Reproductive: Negative. Other: No pelvis free fluid. Musculoskeletal: Chronic facet arthropathy at the lumbosacral junction. Flowing lower thoracic endplate osteophytes with some interbody ankylosis. No acute osseous abnormality identified. IMPRESSION: 1. Left Side Acute Obstructive Uropathy due to two adjacent stones lodged at the Left UPJ, the larger is 8 mm. 2. 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 Additional punctate bilateral nephrolithiasis. Electronically Signed   By: VEAR Hurst M.D.   On: 12/27/2023 05:36   DG Chest Port 1 View Result Date: 12/27/2023 CLINICAL DATA:  48 year old female with possible sepsis. Refractory vomiting. Left side abdominal pain. EXAM: PORTABLE CHEST 1 VIEW COMPARISON:  Chest CTA 08/17/2013 and earlier. FINDINGS: Portable AP semi upright view at 0356 hours. Lung volumes and mediastinal contours remain within normal limits. Visualized tracheal air column is within normal limits. Allowing for portable technique the lungs are clear. No pneumothorax or pleural effusion. No acute osseous abnormality identified. Paucity of bowel gas in the visible abdomen. IMPRESSION: Negative portable chest. Electronically Signed   By: VEAR Hurst M.D.   On: 12/27/2023 04:08    ------  Ole Bourdon, NP Pager: [phone]   Please contact the urology consult pager with any further questions/concerns.

## 2023-12-27 NOTE — Sepsis Progress Note (Addendum)
 Elink RN following sepsis protocol, BC noted at 0447, abx's given [phone]

## 2023-12-28 ENCOUNTER — Encounter (HOSPITAL_COMMUNITY): Payer: Self-pay | Admitting: Urology

## 2023-12-28 LAB — COMPREHENSIVE METABOLIC PANEL WITH GFR
ALT: 31 U/L (ref 0–44)
AST: 24 U/L (ref 15–41)
Albumin: 2.9 g/dL — ABNORMAL LOW (ref 3.5–5.0)
Alkaline Phosphatase: 61 U/L (ref 38–126)
Anion gap: 13 (ref 5–15)
BUN: 16 mg/dL (ref 6–20)
CO2: 24 mmol/L (ref 22–32)
Calcium: 8.7 mg/dL — ABNORMAL LOW (ref 8.9–10.3)
Chloride: 98 mmol/L (ref 98–111)
Creatinine, Ser: 1.02 mg/dL — ABNORMAL HIGH (ref 0.44–1.00)
GFR, Estimated: >60 mL/min (ref >60)
Glucose, Bld: 184 mg/dL — ABNORMAL HIGH (ref 70–99)
Potassium: 3.9 mmol/L (ref 3.5–5.1)
Sodium: 135 mmol/L (ref 135–145)
Total Bilirubin: 0.8 mg/dL (ref 0.0–1.2)
Total Protein: 6.3 g/dL — ABNORMAL LOW (ref 6.5–8.1)

## 2023-12-28 LAB — CBC
HCT: 37.4 % (ref 36.0–46.0)
Hemoglobin: 12.6 g/dL (ref 12.0–15.0)
MCH: 33 pg (ref 26.0–34.0)
MCHC: 33.7 g/dL (ref 30.0–36.0)
MCV: 97.9 fL (ref 80.0–100.0)
Platelets: 260 10*3/uL (ref 150–400)
RBC: 3.82 MIL/uL — ABNORMAL LOW (ref 3.87–5.11)
RDW: 13.7 % (ref 11.5–15.5)
WBC: 13 10*3/uL — ABNORMAL HIGH (ref 4.0–10.5)
nRBC: 0 % (ref 0.0–0.2)

## 2023-12-28 LAB — LACTIC ACID, PLASMA: Lactic Acid, Venous: 3.3 mmol/L (ref 0.5–1.9)

## 2023-12-28 MED ORDER — METOPROLOL SUCCINATE ER 50 MG PO TB24
75.0000 mg | ORAL_TABLET | Freq: Every day | ORAL | Status: DC
  Administered 2023-12-28 – 2023-12-30 (×3): 75 mg via ORAL
  Filled 2023-12-28 (×3): qty 1

## 2023-12-28 MED ORDER — LACTATED RINGERS IV BOLUS
1000.0000 mL | Freq: Once | INTRAVENOUS | Status: AC
  Administered 2023-12-28: 1000 mL via INTRAVENOUS

## 2023-12-28 MED ORDER — ACETAMINOPHEN 325 MG PO TABS: 650.0000 mg | ORAL_TABLET | Freq: Four times a day (QID) | ORAL | Status: DC | PRN

## 2023-12-28 MED ORDER — OXYCODONE HCL 5 MG PO TABS
5.0000 mg | ORAL_TABLET | ORAL | Status: DC | PRN
  Administered 2023-12-28 – 2023-12-29 (×3): 5 mg via ORAL
  Filled 2023-12-28 (×4): qty 1

## 2023-12-28 MED ORDER — ACETAMINOPHEN 500 MG PO TABS: 1000.0000 mg | ORAL_TABLET | Freq: Three times a day (TID) | ORAL | Status: DC

## 2023-12-28 MED ORDER — KETOROLAC TROMETHAMINE 15 MG/ML IJ SOLN
15.0000 mg | Freq: Four times a day (QID) | INTRAMUSCULAR | Status: DC
  Administered 2023-12-28 – 2023-12-30 (×9): 15 mg via INTRAVENOUS
  Filled 2023-12-28 (×9): qty 1

## 2023-12-28 MED ORDER — LACTATED RINGERS IV SOLN: INTRAVENOUS | Status: AC

## 2023-12-28 MED ORDER — ACETAMINOPHEN 500 MG PO TABS
1000.0000 mg | ORAL_TABLET | Freq: Three times a day (TID) | ORAL | Status: DC
  Administered 2023-12-28 – 2023-12-29 (×5): 1000 mg via ORAL
  Filled 2023-12-28 (×6): qty 2

## 2023-12-28 NOTE — Progress Notes (Addendum)
 Pt becoming progressively more tachycardic throughout shift, now in the 130s. Pt originally afebrile on 0000 check, therefore thought to be pain mediated, PRN analgesic given with marginal drop in HR. Temp re-check @ 0300: 101. BP WDL. PRN tylenol  given and Lynwood Kipper, NP notified of temp. Order placed for lactic acid.   0708: Called phlebotomist regarding AM lab collection. Phlembotomist states they will be up ASAP to draw labs.

## 2023-12-28 NOTE — Plan of Care (Signed)
  Problem: Education: Goal: Knowledge of General Education information will improve Description: Including pain rating scale, medication(s)/side effects and non-pharmacologic comfort measures Outcome: Progressing   Problem: Clinical Measurements: Goal: Ability to maintain clinical measurements within normal limits will improve Outcome: Progressing Goal: Diagnostic test results will improve Outcome: Progressing Goal: Respiratory complications will improve Outcome: Progressing Goal: Cardiovascular complication will be avoided Outcome: Progressing   Problem: Activity: Goal: Risk for activity intolerance will decrease Outcome: Progressing   Problem: Nutrition: Goal: Adequate nutrition will be maintained Outcome: Progressing   Problem: Coping: Goal: Level of anxiety will decrease Outcome: Progressing   Problem: Pain Managment: Goal: General experience of comfort will improve and/or be controlled Outcome: Progressing   Problem: Safety: Goal: Ability to remain free from injury will improve Outcome: Progressing

## 2023-12-28 NOTE — Progress Notes (Signed)
 PROGRESS NOTE    Vanessa Bean  FMW:[id number] DOB: May 25, 1976 DOA: 12/27/2023 PCP: Wendee Lynwood HERO, NP  Chief Complaint  Patient presents with   Emesis   Abdominal Pain    Brief Narrative:   Vanessa Bean is Vanessa Bean 48 y.o. female with medical history significant of hypertension, nephrolithiasis, anxiety, depression, and obesity presents with severe vomiting and abdominal pain.   Found to have sepsis due to obstructive pyelo with e. Coli bacteremia.  Now s/p stent placement by urology.    Assessment & Plan:   Principal Problem:   Obstructive pyelonephritis Active Problems:   Severe sepsis (HCC)   Primary hypertension   Current mild episode of major depressive disorder without prior episode (HCC)   Anxiety   Class 3 severe obesity due to excess calories with serious comorbidity and body mass index (BMI) of 50.0 to 59.9 in adult  Severe sepsis secondary to obstructive pyelonephritis E. Coli Bacteremia CT with L side acute obstructive uropathy, contralateral R renal staghorn calculus  Now s/p stent placement by urology Urine and blood cultures with e. Coli - awaiting sensitivities  Continue ceftriaxone  for now Ongoing fevers, rigors this morning   Lactic Acidosis Due to sepsis, monitor   Sinus Tachycardia Fever +/- rebound from holding home metop Follow with resumption  Essential hypertension Will resume metop Hold benicar  and triamterene /hydrochlorothiazide  for now   Anxiety and depression - Continue Cymbalta  and buspirone    Morbid obesity Body mass index is 54.24 kg/m (pended).    DVT prophylaxis: lovenox  Code Status: full Family Communication: husband in room Disposition:   Status is: Inpatient Remains inpatient appropriate because: need for further inpatient care   Consultants:  urology  Procedures:  6/30 1.  Cystoscopy with left  ureteral stent placement 2. left retrograde pyelogram with interpretation 3. Fluoroscopy <1 hour with intraoperative  interpretation  Antimicrobials:  Anti-infectives (From admission, onward)    Start     Dose/Rate Route Frequency Ordered Stop   12/28/23 0600  cefTRIAXone  (ROCEPHIN ) 2 g in sodium chloride  0.9 % 100 mL IVPB        2 g 200 mL/hr over 30 Minutes Intravenous Every 24 hours 12/27/23 1019     12/27/23 0500  cefTRIAXone  (ROCEPHIN ) 2 g in sodium chloride  0.9 % 100 mL IVPB        2 g 200 mL/hr over 30 Minutes Intravenous Once 12/27/23 0457 12/28/23 0700   12/27/23 0500  metroNIDAZOLE (FLAGYL) IVPB 500 mg        500 mg 100 mL/hr over 60 Minutes Intravenous  Once 12/27/23 0457 12/28/23 0700       Subjective: Feels better after toradol  Husband beside her  Objective: Vitals:   12/28/23 0322 12/28/23 0524 12/28/23 0739 12/28/23 0805  BP:  (!) 118/58  (!) 143/71  Pulse:  (!) 119 (!) 119 (!) 118  Resp:  17    Temp:  99.2 F (37.3 C) (!) 101.4 F (38.6 C)   TempSrc:  Oral Oral   SpO2: 90% 96% 100% 97%  Weight:      Height:        Intake/Output Summary (Last 24 hours) at 12/28/2023 1053 Last data filed at 12/28/2023 0630 Gross per 24 hour  Intake 1482.29 ml  Output --  Net 1482.29 ml   Filed Weights   12/27/23 0315 12/27/23 0747  Weight: (!) 143.3 kg (!) (P) 143.3 kg    Examination:  General exam: Appears calm and comfortable, sitting up in chair  Respiratory system:  unlabored Cardiovascular system: RRR No CVA tenderness Central nervous system: Alert and oriented. No focal neurological deficits. Extremities: no LEE   Data Reviewed: I have personally reviewed following labs and imaging studies  CBC: Recent Labs  Lab 12/27/23 0320  WBC 17.4*  HGB 13.9  HCT 41.1  MCV 95.8  PLT 319    Basic Metabolic Panel: Recent Labs  Lab 12/27/23 0320  NA 133*  K 3.9  CL 101  CO2 17*  GLUCOSE 190*  BUN 18  CREATININE 0.96  CALCIUM 9.8    GFR: Estimated Creatinine Clearance: 104.2 mL/min (by C-G formula based on SCr of 0.96 mg/dL).  Liver Function Tests: Recent Labs   Lab 12/27/23 0320  AST 38  ALT 41  ALKPHOS 67  BILITOT 0.5  PROT 6.6  ALBUMIN 3.6    CBG: No results for input(s): GLUCAP in the last 168 hours.   Recent Results (from the past 240 hours)  Urine Culture     Status: Abnormal (Preliminary result)   Collection Time: 12/27/23  3:42 AM   Specimen: Urine, Clean Catch  Result Value Ref Range Status   Specimen Description URINE, CLEAN CATCH  Final   Special Requests   Final    NONE Performed at [facility], [address], [place], [place] [address]    Culture >=100,000 COLONIES/mL ESCHERICHIA COLI (Halea Lieb)  Final   Report Status PENDING  Incomplete  Blood culture (routine x 2)     Status: None (Preliminary result)   Collection Time: 12/27/23  3:43 AM   Specimen: BLOOD  Result Value Ref Range Status   Specimen Description BLOOD BLOOD LEFT FOREARM  Final   Special Requests   Final    BOTTLES DRAWN AEROBIC AND ANAEROBIC Blood Culture adequate volume   Culture  Setup Time   Final    GRAM NEGATIVE RODS AEROBIC BOTTLE ONLY CRITICAL VALUE NOTED.  VALUE IS CONSISTENT WITH PREVIOUSLY REPORTED AND CALLED VALUE. Performed at [facility], [address] [place], [place] [address]    Culture GRAM NEGATIVE RODS  Final   Report Status PENDING  Incomplete  Blood culture (routine x 2)     Status: Abnormal (Preliminary result)   Collection Time: 12/27/23  4:47 AM   Specimen: BLOOD  Result Value Ref Range Status   Specimen Description BLOOD BLOOD RIGHT ARM  Final   Special Requests   Final    BOTTLES DRAWN AEROBIC AND ANAEROBIC Blood Culture adequate volume   Culture  Setup Time   Final    GRAM POSITIVE COCCI ANAEROBIC BOTTLE ONLY CRITICAL RESULT CALLED TO, READ BACK BY AND VERIFIED WITH: PHARMD NSABRA SAMS [id number] @ 1825 FH    Culture (Khole Branch)  Final    ESCHERICHIA COLI SUSCEPTIBILITIES TO FOLLOW Performed at [facility], [address], [place], [place] [address]    Report Status PENDING  Incomplete  Blood Culture ID  Panel (Reflexed)     Status: Abnormal   Collection Time: 12/27/23  4:47 AM  Result Value Ref Range Status   Enterococcus faecalis NOT DETECTED NOT DETECTED Final   Enterococcus Faecium NOT DETECTED NOT DETECTED Final   Listeria monocytogenes NOT DETECTED NOT DETECTED Final   Staphylococcus species NOT DETECTED NOT DETECTED Final   Staphylococcus aureus (BCID) NOT DETECTED NOT DETECTED Final   Staphylococcus epidermidis NOT DETECTED NOT DETECTED Final   Staphylococcus lugdunensis NOT DETECTED NOT DETECTED Final   Streptococcus species NOT DETECTED NOT DETECTED Final   Streptococcus  agalactiae NOT DETECTED NOT DETECTED Final   Streptococcus pneumoniae NOT DETECTED NOT DETECTED Final   Streptococcus pyogenes NOT DETECTED NOT DETECTED Final   Albie Arizpe.calcoaceticus-baumannii NOT DETECTED NOT DETECTED Final   Bacteroides fragilis NOT DETECTED NOT DETECTED Final   Enterobacterales DETECTED (Irja Wheless) NOT DETECTED Final    Comment: Enterobacterales represent Othel Hoogendoorn large order of gram negative bacteria, not Natisha Trzcinski single organism. CRITICAL RESULT CALLED TO, READ BACK BY AND VERIFIED WITH: PHARMD N. GOAD [id number] @ 1825 FH    Enterobacter cloacae complex NOT DETECTED NOT DETECTED Final   Escherichia coli DETECTED (Tirsa Gail) NOT DETECTED Final    Comment: CRITICAL RESULT CALLED TO, READ BACK BY AND VERIFIED WITH: PHARMD N. GOAD [id number] @ 1825 FH    Klebsiella aerogenes NOT DETECTED NOT DETECTED Final   Klebsiella oxytoca NOT DETECTED NOT DETECTED Final   Klebsiella pneumoniae NOT DETECTED NOT DETECTED Final   Proteus species NOT DETECTED NOT DETECTED Final   Salmonella species NOT DETECTED NOT DETECTED Final   Serratia marcescens NOT DETECTED NOT DETECTED Final   Haemophilus influenzae NOT DETECTED NOT DETECTED Final   Neisseria meningitidis NOT DETECTED NOT DETECTED Final   Pseudomonas aeruginosa NOT DETECTED NOT DETECTED Final   Stenotrophomonas maltophilia NOT DETECTED NOT DETECTED Final   Candida albicans NOT DETECTED  NOT DETECTED Final   Candida auris NOT DETECTED NOT DETECTED Final   Candida glabrata NOT DETECTED NOT DETECTED Final   Candida krusei NOT DETECTED NOT DETECTED Final   Candida parapsilosis NOT DETECTED NOT DETECTED Final   Candida tropicalis NOT DETECTED NOT DETECTED Final   Cryptococcus neoformans/gattii NOT DETECTED NOT DETECTED Final   CTX-M ESBL NOT DETECTED NOT DETECTED Final   Carbapenem resistance IMP NOT DETECTED NOT DETECTED Final   Carbapenem resistance KPC NOT DETECTED NOT DETECTED Final   Carbapenem resistance NDM NOT DETECTED NOT DETECTED Final   Carbapenem resist OXA 48 LIKE NOT DETECTED NOT DETECTED Final   Carbapenem resistance VIM NOT DETECTED NOT DETECTED Final    Comment: Performed at [facility], [address] [place], [place] [address]         Radiology Studies: DG C-Arm 1-60 Min Result Date: 12/27/2023 CLINICAL DATA:  Retrograde pyelogram and ureteral stent insertion. EXAM: DG C-ARM 1-60 MIN FLUOROSCOPY: Fluoroscopy Time:  0 minutes 28 seconds Radiation Exposure Index (if provided by the fluoroscopic device): Number of Acquired Spot Images: 0 COMPARISON:  None Available. FINDINGS: Single fluoroscopic spot image of the low central abdomen, without right/left annotate Annelle Behrendt shin, is submitted. There is Betzabe Bevans double-J ureteral stent in place with the proximal loop formed in the kidney. Distal loop is not visualized. Contrast is seen in the renal pelvis. IMPRESSION: Partially visualized ureteral stent placement. Electronically Signed   By: Newell Eke M.D.   On: 12/27/2023 09:41   DG C-Arm 1-60 Min-No Report Result Date: 12/27/2023 Fluoroscopy was utilized by the requesting physician.  No radiographic interpretation.   CT ABDOMEN PELVIS W CONTRAST Result Date: 12/27/2023 CLINICAL DATA:  48 year old female with possible sepsis, left abdominal pain and refractory vomiting. EXAM: CT ABDOMEN AND PELVIS WITH CONTRAST TECHNIQUE: Multidetector CT imaging of the abdomen  and pelvis was performed using the standard protocol following bolus administration of intravenous contrast. RADIATION DOSE REDUCTION: This exam was performed according to the departmental dose-optimization program which includes automated exposure control, adjustment of the mA and/or kV according to patient size and/or use of iterative reconstruction technique. CONTRAST:  75mL OMNIPAQUE  IOHEXOL  350 MG/ML SOLN COMPARISON:  CT Abdomen  and Pelvis 06/29/2020. FINDINGS: Lower chest: Normal heart size. Negative lung bases, no pericardial or pleural effusion. Hepatobiliary: Chronic cholecystectomy. Probable hepatic steatosis but otherwise negative liver. Pancreas: Mild atrophy. Spleen: Negative. Adrenals/Urinary Tract: Normal adrenal glands. Substantially progressed since 2022 right renal staghorn calculus, 2.5 cm long axis. Nonobstructed right kidney. Punctate additional right nephrolithiasis. Right renal enhancement and right ureter appear normal. Abnormal left kidney. Left nephromegaly and hydronephrosis. Obstructing adjacent stones at the left ureteropelvic junction, the larger is 8 mm diameter (coronal image 75). Delayed left nephrogram. Punctate additional intrarenal calculi on the left. Distal to the left UPJ the ureter is mildly inflamed, but tapers into the pelvis. Chronic left hemipelvis phlebolith. No distal ureteral calculus. Diminutive bladder. Stomach/Bowel: Negative large bowel, normal appendix on series 3, image 70. Nondilated small bowel. Rectus muscle diastasis, small fat containing umbilical hernia. No herniated bowel. Diminutive stomach and duodenum. No pneumoperitoneum, free fluid, mesenteric inflammation. Vascular/Lymphatic: Major arterial structures in the portal venous system are patent. Mild Calcified aortic atherosclerosis. No lymphadenopathy identified. Reproductive: Negative. Other: No pelvis free fluid. Musculoskeletal: Chronic facet arthropathy at the lumbosacral junction. Flowing lower  thoracic endplate osteophytes with some interbody ankylosis. No acute osseous abnormality identified. IMPRESSION: 1. Left Side Acute Obstructive Uropathy due to two adjacent stones lodged at the Left UPJ, the larger is 8 mm. 2. 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 Additional punctate bilateral nephrolithiasis. Electronically Signed   By: VEAR Hurst M.D.   On: 12/27/2023 05:36   DG Chest Port 1 View Result Date: 12/27/2023 CLINICAL DATA:  48 year old female with possible sepsis. Refractory vomiting. Left side abdominal pain. EXAM: PORTABLE CHEST 1 VIEW COMPARISON:  Chest CTA 08/17/2013 and earlier. FINDINGS: Portable AP semi upright view at 0356 hours. Lung volumes and mediastinal contours remain within normal limits. Visualized tracheal air column is within normal limits. Allowing for portable technique the lungs are clear. No pneumothorax or pleural effusion. No acute osseous abnormality identified. Paucity of bowel gas in the visible abdomen. IMPRESSION: Negative portable chest. Electronically Signed   By: VEAR Hurst M.D.   On: 12/27/2023 04:08        Scheduled Meds:  acetaminophen   1,000 mg Oral Q8H   busPIRone   10 mg Oral BID   DULoxetine   60 mg Oral Daily   enoxaparin  (LOVENOX ) injection  70 mg Subcutaneous Q24H   ketorolac   15 mg Intravenous Q6H   sodium chloride  flush  3 mL Intravenous Q12H   Continuous Infusions:  cefTRIAXone  (ROCEPHIN )  IV Stopped (12/28/23 0553)   lactated ringers  125 mL/hr at 12/28/23 0804     LOS: 1 day    Time spent: over 30 min    Meliton Monte, MD [facility]   To contact the attending provider between 7A-7P or the covering provider during after hours 7P-7A, please log into the web site [email or web address] and access using universal  password for that web site. If you do not have the password, please call the hospital operator.  12/28/2023, 10:53 AM

## 2023-12-28 NOTE — Progress Notes (Signed)
 1 Day Post-Op Subjective: Some improvement in flank pain since stent placement.  Patient with fever and rigors on rounds this morning.  Objective: Vital signs in last 24 hours: Temp:  [98 F (36.7 C)-101.4 F (38.6 C)] 101.4 F (38.6 C) (07/01 0739) Pulse Rate:  [106-129] 118 (07/01 0805) Resp:  [17-20] 17 (07/01 0524) BP: (118-143)/(58-80) 143/71 (07/01 0805) SpO2:  [90 %-100 %] 97 % (07/01 0805)  Assessment/Plan: # Severe sepsis # E. coli bacteremia # UTI # Left ureteral stone  S/p left ureteral stent placement with Dr. Lovie.  Definitive stone management on outpatient basis. Blood and urine cultures positive for E. coli, sensitivities pending.  Agree with broad ABX Fever and rigor on rounds, stable serum creatinine and interval improvement in leukocytosis. Urology will follow peripherally.  Please notify as she is nearing discharge so that lithotripsy can be scheduled   Intake/Output from previous day: 06/30 0701 - 07/01 0700 In: 2482.3 [P.O.:720; I.V.:1662.3; IV Piggyback:100] Out: -   Intake/Output this shift: No intake/output data recorded.  Physical Exam:  General: Alert and oriented CV: No cyanosis Lungs: equal chest rise Abdomen: Soft, NTND, no rebound or guarding   Lab Results: Recent Labs    12/27/23 0320 12/28/23 0833  HGB 13.9 12.6  HCT 41.1 37.4   BMET Recent Labs    12/27/23 0320 12/28/23 0833  NA 133* 135  K 3.9 3.9  CL 101 98  CO2 17* 24  GLUCOSE 190* 184*  BUN 18 16  CREATININE 0.96 1.02*  CALCIUM 9.8 8.7*  HGB 13.9 12.6  WBC 17.4* 13.0*     Studies/Results: DG C-Arm 1-60 Min Result Date: 12/27/2023 CLINICAL DATA:  Retrograde pyelogram and ureteral stent insertion. EXAM: DG C-ARM 1-60 MIN FLUOROSCOPY: Fluoroscopy Time:  0 minutes 28 seconds Radiation Exposure Index (if provided by the fluoroscopic device): Number of Acquired Spot Images: 0 COMPARISON:  None Available. FINDINGS: Single fluoroscopic spot image of the low  central abdomen, without right/left annotate a shin, is submitted. There is a double-J ureteral stent in place with the proximal loop formed in the kidney. Distal loop is not visualized. Contrast is seen in the renal pelvis. IMPRESSION: Partially visualized ureteral stent placement. Electronically Signed   By: Newell Eke M.D.   On: 12/27/2023 09:41   DG C-Arm 1-60 Min-No Report Result Date: 12/27/2023 Fluoroscopy was utilized by the requesting physician.  No radiographic interpretation.   CT ABDOMEN PELVIS W CONTRAST Result Date: 12/27/2023 CLINICAL DATA:  48 year old female with possible sepsis, left abdominal pain and refractory vomiting. EXAM: CT ABDOMEN AND PELVIS WITH CONTRAST TECHNIQUE: Multidetector CT imaging of the abdomen and pelvis was performed using the standard protocol following bolus administration of intravenous contrast. RADIATION DOSE REDUCTION: This exam was performed according to the departmental dose-optimization program which includes automated exposure control, adjustment of the mA and/or kV according to patient size and/or use of iterative reconstruction technique. CONTRAST:  75mL OMNIPAQUE  IOHEXOL  350 MG/ML SOLN COMPARISON:  CT Abdomen and Pelvis 06/29/2020. FINDINGS: Lower chest: Normal heart size. Negative lung bases, no pericardial or pleural effusion. Hepatobiliary: Chronic cholecystectomy. Probable hepatic steatosis but otherwise negative liver. Pancreas: Mild atrophy. Spleen: Negative. Adrenals/Urinary Tract: Normal adrenal glands. Substantially progressed since 2022 right renal staghorn calculus, 2.5 cm long axis. Nonobstructed right kidney. Punctate additional right nephrolithiasis. Right renal enhancement and right ureter appear normal. Abnormal left kidney. Left nephromegaly and hydronephrosis. Obstructing adjacent stones at the left ureteropelvic junction, the larger is 8 mm diameter (coronal image 75). Delayed  left nephrogram. Punctate additional intrarenal calculi on  the left. Distal to the left UPJ the ureter is mildly inflamed, but tapers into the pelvis. Chronic left hemipelvis phlebolith. No distal ureteral calculus. Diminutive bladder. Stomach/Bowel: Negative large bowel, normal appendix on series 3, image 70. Nondilated small bowel. Rectus muscle diastasis, small fat containing umbilical hernia. No herniated bowel. Diminutive stomach and duodenum. No pneumoperitoneum, free fluid, mesenteric inflammation. Vascular/Lymphatic: Major arterial structures in the portal venous system are patent. Mild Calcified aortic atherosclerosis. No lymphadenopathy identified. Reproductive: Negative. Other: No pelvis free fluid. Musculoskeletal: Chronic facet arthropathy at the lumbosacral junction. Flowing lower thoracic endplate osteophytes with some interbody ankylosis. No acute osseous abnormality identified. IMPRESSION: 1. Left Side Acute Obstructive Uropathy due to two adjacent stones lodged at the Left UPJ, the larger is 8 mm. 2. 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 Additional punctate bilateral nephrolithiasis. Electronically Signed   By: VEAR Hurst M.D.   On: 12/27/2023 05:36   DG Chest Port 1 View Result Date: 12/27/2023 CLINICAL DATA:  48 year old female with possible sepsis. Refractory vomiting. Left side abdominal pain. EXAM: PORTABLE CHEST 1 VIEW COMPARISON:  Chest CTA 08/17/2013 and earlier. FINDINGS: Portable AP semi upright view at 0356 hours. Lung volumes and mediastinal contours remain within normal limits. Visualized tracheal air column is within normal limits. Allowing for portable technique the lungs are clear. No pneumothorax or pleural effusion. No acute osseous abnormality identified. Paucity of bowel gas in the visible abdomen. IMPRESSION: Negative portable chest. Electronically Signed   By: VEAR Hurst M.D.   On: 12/27/2023 04:08      LOS: 1 day   Ole Bourdon, NP [facility] Pager:  [phone]  12/28/2023, 11:27 AM

## 2023-12-28 NOTE — Anesthesia Postprocedure Evaluation (Signed)
 Anesthesia Post Note  Patient: Aleeyah Bensen Palinkas  Procedure(s) Performed: CYSTOSCOPY, WITH RETROGRADE PYELOGRAM AND URETERAL STENT INSERTION (Left)     Patient location during evaluation: PACU Anesthesia Type: General Level of consciousness: awake and alert Pain management: pain level controlled Vital Signs Assessment: post-procedure vital signs reviewed and stable Respiratory status: spontaneous breathing, nonlabored ventilation, respiratory function stable and patient connected to nasal cannula oxygen Cardiovascular status: blood pressure returned to baseline and stable Postop Assessment: no apparent nausea or vomiting Anesthetic complications: no   No notable events documented.  Last Vitals:  Vitals:   12/28/23 1912 12/28/23 2000  BP: 120/70   Pulse: (!) 110   Resp: 19   Temp: 37.3 C (!) 38.9 C  SpO2: 96%     Last Pain:  Vitals:   12/28/23 2000  TempSrc: Axillary  PainSc: 0-No pain                 Epifanio Lamar BRAVO

## 2023-12-28 NOTE — Progress Notes (Signed)
 MEWS Progress Note  Patient Details Name: Vanessa Bean MRN: [id number] DOB: 04-29-1976 Today's Date: 12/28/2023   MEWS Flowsheet Documentation:  Assess: MEWS Score Temp: (!) 101 F (38.3 C) BP: 131/66 MAP (mmHg): 85 Pulse Rate: (!) 129 ECG Heart Rate: (!) 130 Resp: 18 Level of Consciousness: Alert SpO2: 90 % O2 Device: Nasal Cannula O2 Flow Rate (L/min): 2 L/min Assess: MEWS Score MEWS Temp: 1 MEWS Systolic: 0 MEWS Pulse: 3 MEWS RR: 0 MEWS LOC: 0 MEWS Score: 4 MEWS Score Color: Red Assess: SIRS CRITERIA SIRS Temperature : 1 SIRS Respirations : 0 SIRS Pulse: 1 SIRS WBC: 0 SIRS Score Sum : 2 SIRS Temperature : 1 SIRS Pulse: 1 SIRS Respirations : 0 SIRS WBC: 0 SIRS Score Sum : 2 Assess: if the MEWS score is Yellow or Red Were vital signs accurate and taken at a resting state?: Yes Does the patient meet 2 or more of the SIRS criteria?: Yes Does the patient have a confirmed or suspected source of infection?: Yes MEWS guidelines implemented : Yes, yellow Treat MEWS Interventions: Considered administering scheduled or prn medications/treatments as ordered Take Vital Signs Increase Vital Sign Frequency : Yellow: Q2hr x1, continue Q4hrs until patient remains green for 12hrs Escalate MEWS: Escalate: Yellow: Discuss with charge nurse and consider notifying provider and/or RRT Notify: Charge Nurse/RN Name of Charge Nurse/RN Notified: [name], RN Provider Notification Provider Name/Title: Lynwood Kipper, NP Date Provider Notified: 12/28/23 Time Provider Notified: [id number] Method of Notification: Page Notification Reason: Change in status Provider response: Evaluate remotely Date of Provider Response: 12/28/23 Time of Provider Response: 0325      Vanessa Bean 12/28/2023, 3:27 AM

## 2023-12-28 NOTE — Progress Notes (Signed)
 MEWS Progress Note  Patient Details Name: Vanessa Bean MRN: [id number] DOB: Apr 20, 1976 Today's Date: 12/28/2023   MEWS Flowsheet Documentation:  Assess: MEWS Score Temp: (!) 102 F (38.9 C) (Ice packs administered) BP: 120/70 MAP (mmHg): 84 Pulse Rate: (!) 111 ECG Heart Rate: (!) 111 Resp: 18 Level of Consciousness: Alert SpO2: 95 % O2 Device: Room Air O2 Flow Rate (L/min): 1 L/min Assess: MEWS Score MEWS Temp: 2 MEWS Systolic: 0 MEWS Pulse: 2 MEWS RR: 0 MEWS LOC: 0 MEWS Score: 4 MEWS Score Color: Red Assess: SIRS CRITERIA SIRS Temperature : 1 SIRS Respirations : 0 SIRS Pulse: 1 SIRS WBC: 1 SIRS Score Sum : 3 SIRS Temperature : 1 SIRS Pulse: 1 SIRS Respirations : 0 SIRS WBC: 1 SIRS Score Sum : 3 Assess: if the MEWS score is Yellow or Red Were vital signs accurate and taken at a resting state?: Yes Does the patient meet 2 or more of the SIRS criteria?: Yes Does the patient have a confirmed or suspected source of infection?: Yes MEWS guidelines implemented : Yes, red Treat MEWS Interventions: Considered administering scheduled or prn medications/treatments as ordered Take Vital Signs Increase Vital Sign Frequency : Red: Q1hr x2, continue Q4hrs until patient remains green for 12hrs Escalate MEWS: Escalate: Red: Discuss with charge nurse and notify provider. Consider notifying RRT. If remains red for 2 hours consider need for higher level of care Notify: Charge Nurse/Vanessa Bean Name of Charge Nurse/Vanessa Bean Notified: [name], Vanessa Bean      [name] 12/28/2023, 8:22 PM

## 2023-12-29 LAB — COMPREHENSIVE METABOLIC PANEL WITH GFR
ALT: 36 U/L (ref 0–44)
AST: 34 U/L (ref 15–41)
Albumin: 2.6 g/dL — ABNORMAL LOW (ref 3.5–5.0)
Alkaline Phosphatase: 65 U/L (ref 38–126)
Anion gap: 9 (ref 5–15)
BUN: 18 mg/dL (ref 6–20)
CO2: 24 mmol/L (ref 22–32)
Calcium: 8.4 mg/dL — ABNORMAL LOW (ref 8.9–10.3)
Chloride: 100 mmol/L (ref 98–111)
Creatinine, Ser: 0.9 mg/dL (ref 0.44–1.00)
GFR, Estimated: >60 mL/min (ref >60)
Glucose, Bld: 180 mg/dL — ABNORMAL HIGH (ref 70–99)
Potassium: 3.7 mmol/L (ref 3.5–5.1)
Sodium: 133 mmol/L — ABNORMAL LOW (ref 135–145)
Total Bilirubin: 0.7 mg/dL (ref 0.0–1.2)
Total Protein: 5.9 g/dL — ABNORMAL LOW (ref 6.5–8.1)

## 2023-12-29 LAB — URINE CULTURE: Culture: >=100000 — AB

## 2023-12-29 LAB — CBC WITH DIFFERENTIAL/PLATELET
Abs Immature Granulocytes: 0.03 10*3/uL (ref 0.00–0.07)
Basophils Absolute: 0 10*3/uL (ref 0.0–0.1)
Basophils Relative: 0 %
Eosinophils Absolute: 0 10*3/uL (ref 0.0–0.5)
Eosinophils Relative: 0 %
HCT: 35.4 % — ABNORMAL LOW (ref 36.0–46.0)
Hemoglobin: 12 g/dL (ref 12.0–15.0)
Immature Granulocytes: 1 %
Lymphocytes Relative: 8 %
Lymphs Abs: 0.5 10*3/uL — ABNORMAL LOW (ref 0.7–4.0)
MCH: 32.7 pg (ref 26.0–34.0)
MCHC: 33.9 g/dL (ref 30.0–36.0)
MCV: 96.5 fL (ref 80.0–100.0)
Monocytes Absolute: 0.4 10*3/uL (ref 0.1–1.0)
Monocytes Relative: 7 %
Neutro Abs: 5 10*3/uL (ref 1.7–7.7)
Neutrophils Relative %: 84 %
Platelets: 198 10*3/uL (ref 150–400)
RBC: 3.67 MIL/uL — ABNORMAL LOW (ref 3.87–5.11)
RDW: 13.7 % (ref 11.5–15.5)
Smear Review: NORMAL
WBC Morphology: INCREASED
WBC: 5.9 10*3/uL (ref 4.0–10.5)
nRBC: 0 % (ref 0.0–0.2)

## 2023-12-29 LAB — MAGNESIUM: Magnesium: 1.5 mg/dL — ABNORMAL LOW (ref 1.7–2.4)

## 2023-12-29 LAB — HEMOGLOBIN A1C
Hgb A1c MFr Bld: 6.7 % — ABNORMAL HIGH (ref 4.8–5.6)
Mean Plasma Glucose: 145.59 mg/dL

## 2023-12-29 LAB — PHOSPHORUS: Phosphorus: 2.2 mg/dL — ABNORMAL LOW (ref 2.5–4.6)

## 2023-12-29 LAB — LACTIC ACID, PLASMA: Lactic Acid, Venous: 1.4 mmol/L (ref 0.5–1.9)

## 2023-12-29 LAB — GLUCOSE, CAPILLARY: Glucose-Capillary: 152 mg/dL — ABNORMAL HIGH (ref 70–99)

## 2023-12-29 MED ORDER — LACTATED RINGERS IV BOLUS
500.0000 mL | Freq: Once | INTRAVENOUS | Status: AC
  Administered 2023-12-29: 500 mL via INTRAVENOUS

## 2023-12-29 MED ORDER — ACETAMINOPHEN-CAFFEINE 500-65 MG PO TABS
2.0000 | ORAL_TABLET | Freq: Once | ORAL | Status: AC
  Administered 2023-12-29: 2 via ORAL
  Filled 2023-12-29 (×2): qty 2

## 2023-12-29 MED ORDER — MAGNESIUM SULFATE 2 GM/50ML IV SOLN
2.0000 g | Freq: Once | INTRAVENOUS | Status: AC
  Administered 2023-12-29: 2 g via INTRAVENOUS
  Filled 2023-12-29: qty 50

## 2023-12-29 MED ORDER — MENTHOL 3 MG MT LOZG
1.0000 | LOZENGE | OROMUCOSAL | Status: DC | PRN
  Administered 2023-12-29: 3 mg via ORAL
  Filled 2023-12-29: qty 9

## 2023-12-29 MED ORDER — POTASSIUM PHOSPHATES 15 MMOLE/5ML IV SOLN
15.0000 mmol | Freq: Once | INTRAVENOUS | Status: AC
  Administered 2023-12-29: 15 mmol via INTRAVENOUS
  Filled 2023-12-29: qty 5

## 2023-12-29 NOTE — Progress Notes (Signed)
   2 Days Post-Op Subjective: Vanessa Bean looks significantly better this morning.  She is sitting up in chair.  Case and plan reviewed.  Objective: Vital signs in last 24 hours: Temp:  [97.9 F (36.6 C)-102.4 F (39.1 C)] 98 F (36.7 C) (07/02 0803) Pulse Rate:  [94-112] 94 (07/02 0803) Resp:  [18-32] 18 (07/02 0803) BP: (109-146)/(64-84) 109/79 (07/02 0803) SpO2:  [92 %-98 %] 92 % (07/02 0803)  Assessment/Plan: # Severe sepsis # E. coli bacteremia # UTI # Left ureteral stone  S/p left ureteral stent placement with Dr. Lovie.  Definitive stone management on outpatient basis. Blood and urine cultures positive for E. Coli.  Pansensitive.  Remains on 2g ceftriaxone . Labs of overwhelmingly normalized and patient looks much better today.  Unfortunately she continues fevering overnight.  She is neurologically stable and can discharge at the discretion of medical team. She is established with our practice and will try to schedule definitive stone management next week. Urology will follow peripherally  Intake/Output from previous day: 07/01 0701 - 07/02 0700 In: 929.9 [P.O.:240; I.V.:295.1; IV Piggyback:394.8] Out: 600 [Urine:600]  Intake/Output this shift: No intake/output data recorded.  Physical Exam:  General: Alert and oriented CV: No cyanosis Lungs: equal chest rise Abdomen: Soft, NTND, no rebound or guarding   Lab Results: Recent Labs    12/27/23 0320 12/28/23 0833 12/29/23 0501  HGB 13.9 12.6 12.0  HCT 41.1 37.4 35.4*   BMET Recent Labs    12/28/23 0833 12/29/23 0501  NA 135 133*  K 3.9 3.7  CL 98 100  CO2 24 24  GLUCOSE 184* 180*  BUN 16 18  CREATININE 1.02* 0.90  CALCIUM 8.7* 8.4*  HGB 12.6 12.0  WBC 13.0* 5.9     Studies/Results: DG C-Arm 1-60 Min Result Date: 12/27/2023 CLINICAL DATA:  Retrograde pyelogram and ureteral stent insertion. EXAM: DG C-ARM 1-60 MIN FLUOROSCOPY: Fluoroscopy Time:  0 minutes 28 seconds Radiation Exposure Index (if  provided by the fluoroscopic device): Number of Acquired Spot Images: 0 COMPARISON:  None Available. FINDINGS: Single fluoroscopic spot image of the low central abdomen, without right/left annotate a shin, is submitted. There is a double-J ureteral stent in place with the proximal loop formed in the kidney. Distal loop is not visualized. Contrast is seen in the renal pelvis. IMPRESSION: Partially visualized ureteral stent placement. Electronically Signed   By: Newell Eke M.D.   On: 12/27/2023 09:41   DG C-Arm 1-60 Min-No Report Result Date: 12/27/2023 Fluoroscopy was utilized by the requesting physician.  No radiographic interpretation.      LOS: 2 days   Ole Bourdon, NP [facility] Pager: [phone]  12/29/2023, 8:38 AM

## 2023-12-29 NOTE — Plan of Care (Signed)
  Problem: Education: Goal: Knowledge of General Education information will improve Description: Including pain rating scale, medication(s)/side effects and non-pharmacologic comfort measures Outcome: Progressing   Problem: Clinical Measurements: Goal: Cardiovascular complication will be avoided Outcome: Progressing   Problem: Activity: Goal: Risk for activity intolerance will decrease Outcome: Progressing   Problem: Nutrition: Goal: Adequate nutrition will be maintained Outcome: Progressing   Problem: Pain Managment: Goal: General experience of comfort will improve and/or be controlled Outcome: Progressing   Problem: Safety: Goal: Ability to remain free from injury will improve Outcome: Progressing

## 2023-12-29 NOTE — Progress Notes (Signed)
 Pt 0400 temp 102.4. Pt appears flushed and diaphoretic. Pt also noted to be tachypnic in the 30s w/ 02 sats struggling to stay above 90%. Lungs clear on auscultation.  Pt verbalizing feelings of distress stating I really dont feel good. BP stable. UOP WDL. Ice packs re-applied, placed on 3LNC, and PRN analgesic given. Lynwood Kipper, NP notified of findings. Orders for 500mL LR bolus placed.    0600: Repeat temp: 100.9. Pt taken off 02, back on RA. RR: 19. Pt reports generally feeling better. Will continue to monitor.

## 2023-12-29 NOTE — Plan of Care (Signed)
  Problem: Education: Goal: Knowledge of General Education information will improve Description: Including pain rating scale, medication(s)/side effects and non-pharmacologic comfort measures Outcome: Progressing   Problem: Clinical Measurements: Goal: Ability to maintain clinical measurements within normal limits will improve Outcome: Progressing Goal: Will remain free from infection Outcome: Progressing Goal: Diagnostic test results will improve Outcome: Progressing Goal: Respiratory complications will improve Outcome: Progressing Goal: Cardiovascular complication will be avoided Outcome: Progressing   Problem: Activity: Goal: Risk for activity intolerance will decrease Outcome: Progressing   Problem: Nutrition: Goal: Adequate nutrition will be maintained Outcome: Progressing   Problem: Coping: Goal: Level of anxiety will decrease Outcome: Progressing   Problem: Pain Managment: Goal: General experience of comfort will improve and/or be controlled Outcome: Progressing   Problem: Safety: Goal: Ability to remain free from injury will improve Outcome: Progressing

## 2023-12-29 NOTE — Progress Notes (Signed)
 PROGRESS NOTE  Vanessa Bean FMW:[id number] DOB: [birth year]   PCP: Wendee Lynwood HERO, NP  Patient is from: Home.  Independently ambulates at baseline.  DOA: 12/27/2023 LOS: 2  Chief complaints Chief Complaint  Patient presents with   Emesis   Abdominal Pain     Brief Narrative / Interim history: 48 year old F with PMH of HTN, nephrolithiasis, anxiety, depression and morbid obesity presenting with sev vomiting and abdominal pain and found to have severe sepsis in the setting of left obstructive pyelonephritis, UTI and E. coli bacteremia.  Patient had ureteral stent on 6/30.  Remains on IV ceftriaxone .  Subjective: Seen and examined earlier this morning.  No major events overnight or this morning.  Had rough night with significant fever and nausea.  She spiked fever to 102.4.  About 4 AM.  Currently feels better.  Objective: Vitals:   12/29/23 0353 12/29/23 0607 12/29/23 0803 12/29/23 1122  BP: (!) 146/84 118/75 109/79 137/79  Pulse: (!) 103 100 94 98  Resp: (!) 32 19 18 18   Temp: (!) 102.4 F (39.1 C) (!) 100.9 F (38.3 C) 98 F (36.7 C) 98.1 F (36.7 C)  TempSrc: Axillary Axillary Oral Oral  SpO2: 95% 92% 92% 95%  Weight:      Height:        Examination:  GENERAL: No apparent distress.  Nontoxic. HEENT: MMM.  Vision and hearing grossly intact.  NECK: Supple.  No apparent JVD.  RESP:  No IWOB.  Fair aeration bilaterally. CVS:  RRR. Heart sounds normal.  ABD/GI/GU: BS+. Abd soft, NTND.  No CVA tenderness. MSK/EXT:  Moves extremities. No apparent deformity. No edema.  SKIN: no apparent skin lesion or wound NEURO: AA.  Oriented appropriately.  No apparent focal neuro deficit. PSYCH: Calm. Normal affect.   Consultants:  Neurology  Procedures: 6/30-left ureteral stent  Microbiology summarized: 6/30-blood culture with E. Coli 6/30-urine culture with E. coli  Assessment and plan: Severe sepsis secondary to E. coli bacteremia and pyelonephritis in the setting of  left UPJ stone: Continues to spike significant fever.  Leukocytosis and lactic acidosis resolved.  Culture data as above. -S/p left ureteral stent on 6/30. -Definitive treatment outpatient -Continue ceftriaxone    Right renal staghorn calculus -Per urology  AKI: In the setting of sepsis and Maxzide .  b/l Cr ~0.6.  Improved. Recent Labs    05/06/23 1426 10/16/23 0723 12/27/23 0320 12/28/23 0833 12/29/23 0501  BUN 14 19 18 16 18   CREATININE 0.57 0.64 0.96 1.02* 0.90  - Continue holding Maxide - Treat sepsis as above - Avoid nephrotoxic meds    Sinus Tachycardia: In the setting of sepsis and holding metoprolol .  Resolved. -Manage sepsis as above - Continue metoprolol    Essential hypertension: Normotensive. -Continue home metoprolol  -Continue holding home Maxide.   Anxiety and depression - Continue Cymbalta  and buspirone   Hypomagnesemia/hypophosphatemia -Replenish and recheck.  Lactic acidosis: Due to severe sepsis.  Resolved.  Hyponatremia: Mild -Continue monitoring  Hyperglycemia: A1c 5.9% in 04/2023 -Recheck hemoglobin A1c  Morbid obesity Body mass index is 54.24 kg/m (pended). - May benefit from GLP-1 agonist outpatient -Encourage lifestyle change to lose weight         DVT prophylaxis:  Lovenox   Code Status: Full code Family Communication: None at bedside Level of care: Med-Surg Status is: Inpatient Remains inpatient appropriate because: Severe sepsis   Final disposition: Home   55 minutes with more than 50% spent in reviewing records, counseling patient/family and coordinating care.   Sch Meds:  Scheduled  Meds:  acetaminophen   1,000 mg Oral Q8H   busPIRone   10 mg Oral BID   DULoxetine   60 mg Oral Daily   enoxaparin  (LOVENOX ) injection  70 mg Subcutaneous Q24H   ketorolac   15 mg Intravenous Q6H   metoprolol  succinate  75 mg Oral Daily   sodium chloride  flush  3 mL Intravenous Q12H   Continuous Infusions:  cefTRIAXone  (ROCEPHIN )  IV  Stopped (12/29/23 0538)   PRN Meds:.acetaminophen  **FOLLOWED BY** [START ON 12/31/2023] acetaminophen , albuterol , hydrALAZINE, HYDROmorphone  (DILAUDID ) injection, ondansetron  **OR** ondansetron  (ZOFRAN ) IV, oxyCODONE   Antimicrobials: Anti-infectives (From admission, onward)    Start     Dose/Rate Route Frequency Ordered Stop   12/28/23 0600  cefTRIAXone  (ROCEPHIN ) 2 g in sodium chloride  0.9 % 100 mL IVPB        2 g 200 mL/hr over 30 Minutes Intravenous Every 24 hours 12/27/23 1019     12/27/23 0500  cefTRIAXone  (ROCEPHIN ) 2 g in sodium chloride  0.9 % 100 mL IVPB        2 g 200 mL/hr over 30 Minutes Intravenous Once 12/27/23 0457 12/28/23 0700   12/27/23 0500  metroNIDAZOLE (FLAGYL) IVPB 500 mg        500 mg 100 mL/hr over 60 Minutes Intravenous  Once 12/27/23 0457 12/28/23 0700        I have personally reviewed the following labs and images: CBC: Recent Labs  Lab 12/27/23 0320 12/28/23 0833 12/29/23 0501  WBC 17.4* 13.0* 5.9  NEUTROABS  --   --  5.0  HGB 13.9 12.6 12.0  HCT 41.1 37.4 35.4*  MCV 95.8 97.9 96.5  PLT 319 260 198   BMP &GFR Recent Labs  Lab 12/27/23 0320 12/28/23 0833 12/29/23 0501  NA 133* 135 133*  K 3.9 3.9 3.7  CL 101 98 100  CO2 17* 24 24  GLUCOSE 190* 184* 180*  BUN 18 16 18   CREATININE 0.96 1.02* 0.90  CALCIUM 9.8 8.7* 8.4*  MG  --   --  1.5*  PHOS  --   --  2.2*   Estimated Creatinine Clearance: 111.1 mL/min (by C-G formula based on SCr of 0.9 mg/dL). Liver & Pancreas: Recent Labs  Lab 12/27/23 0320 12/28/23 0833 12/29/23 0501  AST 38 24 34  ALT 41 31 36  ALKPHOS 67 61 65  BILITOT 0.5 0.8 0.7  PROT 6.6 6.3* 5.9*  ALBUMIN 3.6 2.9* 2.6*   Recent Labs  Lab 12/27/23 0320  LIPASE 29   No results for input(s): AMMONIA in the last 168 hours. Diabetic: No results for input(s): HGBA1C in the last 72 hours. Recent Labs  Lab 12/29/23 0444  GLUCAP 152*   Cardiac Enzymes: No results for input(s): CKTOTAL, CKMB,  CKMBINDEX, TROPONINI in the last 168 hours. No results for input(s): PROBNP in the last 8760 hours. Coagulation Profile: Recent Labs  Lab 12/27/23 0320  INR 1.0   Thyroid  Function Tests: No results for input(s): TSH, T4TOTAL, FREET4, T3FREE, THYROIDAB in the last 72 hours. Lipid Profile: No results for input(s): CHOL, HDL, LDLCALC, TRIG, CHOLHDL, LDLDIRECT in the last 72 hours. Anemia Panel: No results for input(s): VITAMINB12, FOLATE, FERRITIN, TIBC, IRON, RETICCTPCT in the last 72 hours. Urine analysis:    Component Value Date/Time   COLORURINE YELLOW 12/27/2023 0406   APPEARANCEUR HAZY (A) 12/27/2023 0406   LABSPEC 1.011 12/27/2023 0406   PHURINE 6.0 12/27/2023 0406   GLUCOSEU NEGATIVE 12/27/2023 0406   HGBUR MODERATE (A) 12/27/2023 0406   BILIRUBINUR NEGATIVE 12/27/2023 0406  KETONESUR NEGATIVE 12/27/2023 0406   PROTEINUR 30 (A) 12/27/2023 0406   UROBILINOGEN 0.2 11/20/2009 0850   NITRITE POSITIVE (A) 12/27/2023 0406   LEUKOCYTESUR LARGE (A) 12/27/2023 0406   Sepsis Labs: Invalid input(s): PROCALCITONIN, LACTICIDVEN  Microbiology: Recent Results (from the past 240 hours)  Urine Culture     Status: Abnormal   Collection Time: 12/27/23  3:42 AM   Specimen: Urine, Clean Catch  Result Value Ref Range Status   Specimen Description URINE, CLEAN CATCH  Final   Special Requests   Final    NONE Performed at [facility], [address] [place], [place] [address]    Culture >=100,000 COLONIES/mL ESCHERICHIA COLI (A)  Final   Report Status 12/29/2023 FINAL  Final   Organism ID, Bacteria ESCHERICHIA COLI (A)  Final      Susceptibility   Escherichia coli - MIC*    AMPICILLIN >=32 RESISTANT Resistant     CEFAZOLIN <=4 SENSITIVE Sensitive     CEFEPIME  <=0.12 SENSITIVE Sensitive     CEFTRIAXONE  <=0.25 SENSITIVE Sensitive     CIPROFLOXACIN  <=0.25 SENSITIVE Sensitive     GENTAMICIN <=1 SENSITIVE Sensitive     IMIPENEM <=0.25  SENSITIVE Sensitive     NITROFURANTOIN <=16 SENSITIVE Sensitive     TRIMETH /SULFA  <=20 SENSITIVE Sensitive     AMPICILLIN/SULBACTAM 16 INTERMEDIATE Intermediate     PIP/TAZO <=4 SENSITIVE Sensitive ug/mL    * >=100,000 COLONIES/mL ESCHERICHIA COLI  Blood culture (routine x 2)     Status: Abnormal (Preliminary result)   Collection Time: 12/27/23  3:43 AM   Specimen: BLOOD  Result Value Ref Range Status   Specimen Description BLOOD BLOOD LEFT FOREARM  Final   Special Requests   Final    BOTTLES DRAWN AEROBIC AND ANAEROBIC Blood Culture adequate volume   Culture  Setup Time   Final    GRAM NEGATIVE RODS AEROBIC BOTTLE ONLY CRITICAL VALUE NOTED.  VALUE IS CONSISTENT WITH PREVIOUSLY REPORTED AND CALLED VALUE.    Culture (A)  Final    ESCHERICHIA COLI SUSCEPTIBILITIES PERFORMED ON PREVIOUS CULTURE WITHIN THE LAST 5 DAYS. CULTURE REINCUBATED FOR BETTER GROWTH Performed at [facility], [address] [place], [place] [address]    Report Status PENDING  Incomplete  Blood culture (routine x 2)     Status: Abnormal (Preliminary result)   Collection Time: 12/27/23  4:47 AM   Specimen: BLOOD  Result Value Ref Range Status   Specimen Description BLOOD BLOOD RIGHT ARM  Final   Special Requests   Final    BOTTLES DRAWN AEROBIC AND ANAEROBIC Blood Culture adequate volume   Culture  Setup Time   Final    GRAM NEGATIVE RODS IN BOTH AEROBIC AND ANAEROBIC BOTTLES CORRECTED RESULTS PREVIOUSLY REPORTED AS: GRAM POSITIVE COCCI ANAEROBIC BOTTLE ONLY CORRECTED RESULTS CALLED TO: E. SINCLAIR PHARMD, AT 1405 12/28/23 D. VANHOOK    Culture (A)  Final    ESCHERICHIA COLI CULTURE REINCUBATED FOR BETTER GROWTH Performed at [facility], [address], [place], [place] [address]    Report Status PENDING  Incomplete   Organism ID, Bacteria ESCHERICHIA COLI  Final   Organism ID, Bacteria ESCHERICHIA COLI  Final      Susceptibility   Escherichia coli - KIRBY BAUER*    CEFAZOLIN INTERMEDIATE  Intermediate    Escherichia coli - MIC*    AMPICILLIN >=32 RESISTANT Resistant     CEFEPIME  <=0.12 SENSITIVE Sensitive     CEFTAZIDIME <=1 SENSITIVE Sensitive  CEFTRIAXONE  <=0.25 SENSITIVE Sensitive     CIPROFLOXACIN  <=0.25 SENSITIVE Sensitive     GENTAMICIN <=1 SENSITIVE Sensitive     IMIPENEM <=0.25 SENSITIVE Sensitive     TRIMETH /SULFA  <=20 SENSITIVE Sensitive     AMPICILLIN/SULBACTAM 16 INTERMEDIATE Intermediate     PIP/TAZO <=4 SENSITIVE Sensitive ug/mL    * ESCHERICHIA COLI    ESCHERICHIA COLI  Blood Culture ID Panel (Reflexed)     Status: Abnormal   Collection Time: 12/27/23  4:47 AM  Result Value Ref Range Status   Enterococcus faecalis NOT DETECTED NOT DETECTED Final   Enterococcus Faecium NOT DETECTED NOT DETECTED Final   Listeria monocytogenes NOT DETECTED NOT DETECTED Final   Staphylococcus species NOT DETECTED NOT DETECTED Final   Staphylococcus aureus (BCID) NOT DETECTED NOT DETECTED Final   Staphylococcus epidermidis NOT DETECTED NOT DETECTED Final   Staphylococcus lugdunensis NOT DETECTED NOT DETECTED Final   Streptococcus species NOT DETECTED NOT DETECTED Final   Streptococcus agalactiae NOT DETECTED NOT DETECTED Final   Streptococcus pneumoniae NOT DETECTED NOT DETECTED Final   Streptococcus pyogenes NOT DETECTED NOT DETECTED Final   A.calcoaceticus-baumannii NOT DETECTED NOT DETECTED Final   Bacteroides fragilis NOT DETECTED NOT DETECTED Final   Enterobacterales DETECTED (A) NOT DETECTED Final    Comment: Enterobacterales represent a large order of gram negative bacteria, not a single organism. CRITICAL RESULT CALLED TO, READ BACK BY AND VERIFIED WITH: PHARMD N. GOAD [id number] @ 1825 FH    Enterobacter cloacae complex NOT DETECTED NOT DETECTED Final   Escherichia coli DETECTED (A) NOT DETECTED Final    Comment: CRITICAL RESULT CALLED TO, READ BACK BY AND VERIFIED WITH: PHARMD N. GOAD [id number] @ 1825 FH    Klebsiella aerogenes NOT DETECTED NOT DETECTED Final    Klebsiella oxytoca NOT DETECTED NOT DETECTED Final   Klebsiella pneumoniae NOT DETECTED NOT DETECTED Final   Proteus species NOT DETECTED NOT DETECTED Final   Salmonella species NOT DETECTED NOT DETECTED Final   Serratia marcescens NOT DETECTED NOT DETECTED Final   Haemophilus influenzae NOT DETECTED NOT DETECTED Final   Neisseria meningitidis NOT DETECTED NOT DETECTED Final   Pseudomonas aeruginosa NOT DETECTED NOT DETECTED Final   Stenotrophomonas maltophilia NOT DETECTED NOT DETECTED Final   Candida albicans NOT DETECTED NOT DETECTED Final   Candida auris NOT DETECTED NOT DETECTED Final   Candida glabrata NOT DETECTED NOT DETECTED Final   Candida krusei NOT DETECTED NOT DETECTED Final   Candida parapsilosis NOT DETECTED NOT DETECTED Final   Candida tropicalis NOT DETECTED NOT DETECTED Final   Cryptococcus neoformans/gattii NOT DETECTED NOT DETECTED Final   CTX-M ESBL NOT DETECTED NOT DETECTED Final   Carbapenem resistance IMP NOT DETECTED NOT DETECTED Final   Carbapenem resistance KPC NOT DETECTED NOT DETECTED Final   Carbapenem resistance NDM NOT DETECTED NOT DETECTED Final   Carbapenem resist OXA 48 LIKE NOT DETECTED NOT DETECTED Final   Carbapenem resistance VIM NOT DETECTED NOT DETECTED Final    Comment: Performed at [facility], [address] [place], [place] [address]    Radiology Studies: No results found.    Juanya Villavicencio T. [employer]is[employer] [facility]  If 7PM-7AM, please contact night-coverage [email or web address] 12/29/2023, 11:28 AM

## 2023-12-30 ENCOUNTER — Other Ambulatory Visit (HOSPITAL_COMMUNITY): Payer: Self-pay

## 2023-12-30 LAB — CBC
HCT: 35.1 % — ABNORMAL LOW (ref 36.0–46.0)
Hemoglobin: 11.6 g/dL — ABNORMAL LOW (ref 12.0–15.0)
MCH: 32 pg (ref 26.0–34.0)
MCHC: 33 g/dL (ref 30.0–36.0)
MCV: 96.7 fL (ref 80.0–100.0)
Platelets: 214 10*3/uL (ref 150–400)
RBC: 3.63 MIL/uL — ABNORMAL LOW (ref 3.87–5.11)
RDW: 13.5 % (ref 11.5–15.5)
WBC: 7.6 10*3/uL (ref 4.0–10.5)
nRBC: 0 % (ref 0.0–0.2)

## 2023-12-30 LAB — CULTURE, BLOOD (ROUTINE X 2)
Special Requests: ADEQUATE
Special Requests: ADEQUATE

## 2023-12-30 LAB — RENAL FUNCTION PANEL
Albumin: 2.5 g/dL — ABNORMAL LOW (ref 3.5–5.0)
Anion gap: 11 (ref 5–15)
BUN: 14 mg/dL (ref 6–20)
CO2: 26 mmol/L (ref 22–32)
Calcium: 8.5 mg/dL — ABNORMAL LOW (ref 8.9–10.3)
Chloride: 96 mmol/L — ABNORMAL LOW (ref 98–111)
Creatinine, Ser: 0.73 mg/dL (ref 0.44–1.00)
GFR, Estimated: >60 mL/min (ref >60)
Glucose, Bld: 166 mg/dL — ABNORMAL HIGH (ref 70–99)
Phosphorus: 1.7 mg/dL — ABNORMAL LOW (ref 2.5–4.6)
Potassium: 3.5 mmol/L (ref 3.5–5.1)
Sodium: 133 mmol/L — ABNORMAL LOW (ref 135–145)

## 2023-12-30 LAB — MAGNESIUM: Magnesium: 1.8 mg/dL (ref 1.7–2.4)

## 2023-12-30 MED ORDER — K-PHOS 500 MG PO TABS
500.0000 mg | ORAL_TABLET | Freq: Three times a day (TID) | ORAL | 0 refills | Status: DC
  Filled 2023-12-30: qty 20, 5d supply, fill #0

## 2023-12-30 MED ORDER — LEVOFLOXACIN 750 MG PO TABS: 750.0000 mg | ORAL_TABLET | Freq: Every day | ORAL | Status: DC

## 2023-12-30 MED ORDER — LEVOFLOXACIN 750 MG PO TABS
750.0000 mg | ORAL_TABLET | Freq: Every day | ORAL | 0 refills | Status: AC
  Filled 2023-12-30: qty 10, 10d supply, fill #0

## 2023-12-30 MED ORDER — CEFADROXIL 500 MG PO CAPS
1000.0000 mg | ORAL_CAPSULE | Freq: Two times a day (BID) | ORAL | 0 refills | Status: DC
  Filled 2023-12-30: qty 32, 8d supply, fill #0

## 2023-12-30 MED ORDER — PHOSPHA 250 NEUTRAL 155-852-130 MG PO TABS
2.0000 | ORAL_TABLET | Freq: Two times a day (BID) | ORAL | 0 refills | Status: AC
Start: 2023-12-30 — End: 2024-01-04
  Filled 2023-12-30: qty 20, 5d supply, fill #0

## 2023-12-30 NOTE — Discharge Summary (Signed)
 Physician Discharge Summary  Vanessa Bean FMW:[id number] DOB: Sep 16, 1975 DOA: 12/27/2023  PCP: Wendee Lynwood HERO, NP  Admit date: 12/27/2023 Discharge date: 12/30/23  Admitted From: Home Disposition: Home Recommendations for Outpatient Follow-up:  Outpatient follow-up with PCP in 1 to 2 weeks Urology to arrange outpatient follow-up for definitive treatment of ureteral stone Check CMP and CBC at follow-up Consider initiation of metformin outpatient for newly diagnosed diabetes.  A1c 6.7%. Please follow up on the following pending results: None  Home Health: No need identified Equipment/Devices: No need identified  Discharge Condition: Stable CODE STATUS: Full code  Follow-up Information     Wendee Lynwood HERO, NP. Schedule an appointment as soon as possible for a visit in 1 week(s).   Specialties: Nurse Practitioner, Family Medicine Contact information: [address] [place] [address] [phone]                 Hospital course 48 year old F with PMH of HTN, nephrolithiasis, anxiety, depression and morbid obesity presenting with sev vomiting and abdominal pain and found to have severe sepsis in the setting of left obstructive pyelonephritis, UTI and E. coli bacteremia.  Patient had ureteral stent on 6/30.  She received IV ceftriaxone  for 4 days with significant improvement, and discharged on p.o. Levaquin for 10 more days to complete a total of 14 days course based on culture sensitivity.  Sepsis physiology resolved.   Patient to follow-up with urology for definitive treatment of ureteral stone.  See individual problem list below for more.   Problems addressed during this hospitalization Severe sepsis secondary to E. coli bacteremia and pyelonephritis in the setting of left UPJ stone:  Sepsis physiology resolved.  No further fever. -S/p left ureteral stent on 6/30. -Received IV ceftriaxone  for 4 days and discharged on p.o. Levaquin for 10 more days for total of  14 days course based on culture sensitivity. - Outpatient follow-up with urology for definitive treatment of ureteral stone.   Right renal staghorn calculus -Per urology   AKI: In the setting of sepsis and Maxzide .  b/l Cr ~0.6.  AKI resolved. - Recheck renal function at follow-up   Sinus Tachycardia: In the setting of sepsis and holding metoprolol .  Resolved. - Continue home metoprolol .   Essential hypertension: Normotensive. -Continue home metoprolol , ARB and Maxzide .   Anxiety and depression: Stable - Continue Cymbalta  and buspirone    Hypomagnesemia/hypophosphatemia:  - Discharged on p.o. phosphorus supplementation   Lactic acidosis: Due to severe sepsis.  Resolved.   Hyponatremia: Mild and stable.   NIDDM-2 with hyperglycemia: A1c 6.7%.  Not on meds at home.   -Consider low-dose metformin outpatient Cr   Morbid obesity Body mass index is 54.24 kg/m (pended).           Consultations: Urology  Time spent 35  minutes  Vital signs Vitals:   12/29/23 1959 12/29/23 2314 12/30/23 0253 12/30/23 0747  BP: 113/65 128/60 (!) 141/79 130/78  Pulse: 92 94 95 100  Temp: 98 F (36.7 C) 98.4 F (36.9 C) 98.4 F (36.9 C) 98.4 F (36.9 C)  Resp: 18 20 18 18   Height:      Weight:      SpO2: 94% 96% 90%   TempSrc: Oral Oral Oral Oral  BMI (Calculated):         Discharge exam GENERAL: No apparent distress.  Nontoxic. HEENT: MMM.  Vision and hearing grossly intact.  NECK: Supple.  No apparent JVD.  RESP:  No IWOB.  Fair aeration  bilaterally. CVS:  RRR. Heart sounds normal.  ABD/GI/GU: BS+. Abd soft, NTND.  No CVA tenderness. MSK/EXT:  Moves extremities. No apparent deformity. No edema.  SKIN: no apparent skin lesion or wound NEURO: AA.  Oriented appropriately.  No apparent focal neuro deficit. PSYCH: Calm. Normal affect.    Discharge Instructions Discharge Instructions     Diet - low sodium heart healthy   Complete by: As directed    Discharge instructions    Complete by: As directed    It has been a pleasure taking care of you!  You were hospitalized due to urinary tract, kidney and blood infection in the setting of kidney stone for which you have been treated with stent placement and antibiotics.  Your symptoms improved.  We are discharging you on oral antibiotics to complete treatment course.  This very important that you complete the whole course of antibiotics.  Follow-up with your primary care doctor 1 week.  Follow-up with urology per their recommendation.   Take care,   Increase activity slowly   Complete by: As directed    No wound care   Complete by: As directed       Allergies as of 12/30/2023   No Known Allergies      Medication List     TAKE these medications    acetaminophen  500 MG tablet Commonly known as: TYLENOL  Take 1,000 mg by mouth every 6 (six) hours as needed for mild pain (pain score 1-3), moderate pain (pain score 4-6), fever or headache.   busPIRone  10 MG tablet Commonly known as: BUSPAR  Take 1 tablet (10 mg total) by mouth 2 (two) times daily.   D-Mannose 500 MG Caps Take 500 mg by mouth daily.   DULoxetine  60 MG capsule Commonly known as: CYMBALTA  Take 1 capsule by mouth once daily   levofloxacin 750 MG tablet Commonly known as: Levaquin Take 1 tablet (750 mg total) by mouth daily for 10 days.   meclizine  25 MG tablet Commonly known as: ANTIVERT  Take 1 tablet (25 mg total) by mouth 3 (three) times daily as needed for dizziness.   metoprolol  succinate 50 MG 24 hr tablet Commonly known as: TOPROL -XL Take 1.5 tablets (75 mg total) by mouth daily. Take with or immediately following a meal.   multivitamin with minerals Tabs tablet Take 1 tablet by mouth daily.   olmesartan  20 MG tablet Commonly known as: BENICAR  Take 1 tablet (20 mg total) by mouth daily.   Phospha 250 Neutral 155-852-130 MG Tabs Take 2 tablets by mouth in the morning and at bedtime for 5 days.    triamterene -hydrochlorothiazide  37.5-25 MG tablet Commonly known as: MAXZIDE -25 Take 1 tablet by mouth once daily   valACYclovir  1000 MG tablet Commonly known as: VALTREX  Take 2 tablets (2,000 mg total) by mouth 2 (two) times daily.         Procedures/Studies: 6/30-left ureteral stent    DG C-Arm 1-60 Min Result Date: 12/27/2023 CLINICAL DATA:  Retrograde pyelogram and ureteral stent insertion. EXAM: DG C-ARM 1-60 MIN FLUOROSCOPY: Fluoroscopy Time:  0 minutes 28 seconds Radiation Exposure Index (if provided by the fluoroscopic device): Number of Acquired Spot Images: 0 COMPARISON:  None Available. FINDINGS: Single fluoroscopic spot image of the low central abdomen, without right/left annotate a shin, is submitted. There is a double-J ureteral stent in place with the proximal loop formed in the kidney. Distal loop is not visualized. Contrast is seen in the renal pelvis. IMPRESSION: Partially visualized ureteral stent placement. Electronically Signed  By: Newell Eke M.D.   On: 12/27/2023 09:41   DG C-Arm 1-60 Min-No Report Result Date: 12/27/2023 Fluoroscopy was utilized by the requesting physician.  No radiographic interpretation.   CT ABDOMEN PELVIS W CONTRAST Result Date: 12/27/2023 CLINICAL DATA:  48 year old female with possible sepsis, left abdominal pain and refractory vomiting. EXAM: CT ABDOMEN AND PELVIS WITH CONTRAST TECHNIQUE: Multidetector CT imaging of the abdomen and pelvis was performed using the standard protocol following bolus administration of intravenous contrast. RADIATION DOSE REDUCTION: This exam was performed according to the departmental dose-optimization program which includes automated exposure control, adjustment of the mA and/or kV according to patient size and/or use of iterative reconstruction technique. CONTRAST:  75mL OMNIPAQUE  IOHEXOL  350 MG/ML SOLN COMPARISON:  CT Abdomen and Pelvis 06/29/2020. FINDINGS: Lower chest: Normal heart size. Negative lung  bases, no pericardial or pleural effusion. Hepatobiliary: Chronic cholecystectomy. Probable hepatic steatosis but otherwise negative liver. Pancreas: Mild atrophy. Spleen: Negative. Adrenals/Urinary Tract: Normal adrenal glands. Substantially progressed since 2022 right renal staghorn calculus, 2.5 cm long axis. Nonobstructed right kidney. Punctate additional right nephrolithiasis. Right renal enhancement and right ureter appear normal. Abnormal left kidney. Left nephromegaly and hydronephrosis. Obstructing adjacent stones at the left ureteropelvic junction, the larger is 8 mm diameter (coronal image 75). Delayed left nephrogram. Punctate additional intrarenal calculi on the left. Distal to the left UPJ the ureter is mildly inflamed, but tapers into the pelvis. Chronic left hemipelvis phlebolith. No distal ureteral calculus. Diminutive bladder. Stomach/Bowel: Negative large bowel, normal appendix on series 3, image 70. Nondilated small bowel. Rectus muscle diastasis, small fat containing umbilical hernia. No herniated bowel. Diminutive stomach and duodenum. No pneumoperitoneum, free fluid, mesenteric inflammation. Vascular/Lymphatic: Major arterial structures in the portal venous system are patent. Mild Calcified aortic atherosclerosis. No lymphadenopathy identified. Reproductive: Negative. Other: No pelvis free fluid. Musculoskeletal: Chronic facet arthropathy at the lumbosacral junction. Flowing lower thoracic endplate osteophytes with some interbody ankylosis. No acute osseous abnormality identified. IMPRESSION: 1. Left Side Acute Obstructive Uropathy due to two adjacent stones lodged at the Left UPJ, the larger is 8 mm. 2. 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 Additional punctate bilateral nephrolithiasis. Electronically Signed   By: VEAR Hurst M.D.   On: 12/27/2023 05:36   DG Chest Port 1 View Result Date: 12/27/2023 CLINICAL DATA:  48 year old female with  possible sepsis. Refractory vomiting. Left side abdominal pain. EXAM: PORTABLE CHEST 1 VIEW COMPARISON:  Chest CTA 08/17/2013 and earlier. FINDINGS: Portable AP semi upright view at 0356 hours. Lung volumes and mediastinal contours remain within normal limits. Visualized tracheal air column is within normal limits. Allowing for portable technique the lungs are clear. No pneumothorax or pleural effusion. No acute osseous abnormality identified. Paucity of bowel gas in the visible abdomen. IMPRESSION: Negative portable chest. Electronically Signed   By: VEAR Hurst M.D.   On: 12/27/2023 04:08       The results of significant diagnostics from this hospitalization (including imaging, microbiology, ancillary and laboratory) are listed below for reference.     Microbiology: Recent Results (from the past 240 hours)  Urine Culture     Status: Abnormal   Collection Time: 12/27/23  3:42 AM   Specimen: Urine, Clean Catch  Result Value Ref Range Status   Specimen Description URINE, CLEAN CATCH  Final   Special Requests   Final    NONE Performed at [facility], [address], [place], [place] [address]    Culture >=100,000 COLONIES/mL ESCHERICHIA  COLI (A)  Final   Report Status 12/29/2023 FINAL  Final   Organism ID, Bacteria ESCHERICHIA COLI (A)  Final      Susceptibility   Escherichia coli - MIC*    AMPICILLIN >=32 RESISTANT Resistant     CEFAZOLIN <=4 SENSITIVE Sensitive     CEFEPIME  <=0.12 SENSITIVE Sensitive     CEFTRIAXONE  <=0.25 SENSITIVE Sensitive     CIPROFLOXACIN  <=0.25 SENSITIVE Sensitive     GENTAMICIN <=1 SENSITIVE Sensitive     IMIPENEM <=0.25 SENSITIVE Sensitive     NITROFURANTOIN <=16 SENSITIVE Sensitive     TRIMETH /SULFA  <=20 SENSITIVE Sensitive     AMPICILLIN/SULBACTAM 16 INTERMEDIATE Intermediate     PIP/TAZO <=4 SENSITIVE Sensitive ug/mL    * >=100,000 COLONIES/mL ESCHERICHIA COLI  Blood culture (routine x 2)     Status: Abnormal   Collection Time: 12/27/23  3:43 AM    Specimen: BLOOD  Result Value Ref Range Status   Specimen Description BLOOD BLOOD LEFT FOREARM  Final   Special Requests   Final    BOTTLES DRAWN AEROBIC AND ANAEROBIC Blood Culture adequate volume   Culture  Setup Time   Final    GRAM NEGATIVE RODS AEROBIC BOTTLE ONLY CRITICAL VALUE NOTED.  VALUE IS CONSISTENT WITH PREVIOUSLY REPORTED AND CALLED VALUE.    Culture (A)  Final    ESCHERICHIA COLI SUSCEPTIBILITIES PERFORMED ON PREVIOUS CULTURE WITHIN THE LAST 5 DAYS. Performed at [facility], [address] [place], [place] [address]    Report Status 12/30/2023 FINAL  Final  Blood culture (routine x 2)     Status: Abnormal   Collection Time: 12/27/23  4:47 AM   Specimen: BLOOD  Result Value Ref Range Status   Specimen Description BLOOD BLOOD RIGHT ARM  Final   Special Requests   Final    BOTTLES DRAWN AEROBIC AND ANAEROBIC Blood Culture adequate volume   Culture  Setup Time   Final    GRAM NEGATIVE RODS IN BOTH AEROBIC AND ANAEROBIC BOTTLES CORRECTED RESULTS PREVIOUSLY REPORTED AS: GRAM POSITIVE COCCI ANAEROBIC BOTTLE ONLY CORRECTED RESULTS CALLED TO: E. SINCLAIR PHARMD, AT 1405 12/28/23 D. VANHOOK Performed at [facility], [address], [place], [place] [address]    Culture ESCHERICHIA COLI (A)  Final   Report Status 12/30/2023 FINAL  Final   Organism ID, Bacteria ESCHERICHIA COLI  Final   Organism ID, Bacteria ESCHERICHIA COLI  Final      Susceptibility   Escherichia coli - KIRBY BAUER*    CEFAZOLIN INTERMEDIATE Intermediate    Escherichia coli - MIC*    AMPICILLIN >=32 RESISTANT Resistant     CEFEPIME  <=0.12 SENSITIVE Sensitive     CEFTAZIDIME <=1 SENSITIVE Sensitive     CEFTRIAXONE  <=0.25 SENSITIVE Sensitive     CIPROFLOXACIN  <=0.25 SENSITIVE Sensitive     GENTAMICIN <=1 SENSITIVE Sensitive     IMIPENEM <=0.25 SENSITIVE Sensitive     TRIMETH /SULFA  <=20 SENSITIVE Sensitive     AMPICILLIN/SULBACTAM 16 INTERMEDIATE Intermediate     PIP/TAZO <=4 SENSITIVE  Sensitive ug/mL    * ESCHERICHIA COLI    ESCHERICHIA COLI  Blood Culture ID Panel (Reflexed)     Status: Abnormal   Collection Time: 12/27/23  4:47 AM  Result Value Ref Range Status   Enterococcus faecalis NOT DETECTED NOT DETECTED Final   Enterococcus Faecium NOT DETECTED NOT DETECTED Final   Listeria monocytogenes NOT DETECTED NOT DETECTED Final   Staphylococcus species NOT DETECTED NOT DETECTED Final   Staphylococcus aureus (  BCID) NOT DETECTED NOT DETECTED Final   Staphylococcus epidermidis NOT DETECTED NOT DETECTED Final   Staphylococcus lugdunensis NOT DETECTED NOT DETECTED Final   Streptococcus species NOT DETECTED NOT DETECTED Final   Streptococcus agalactiae NOT DETECTED NOT DETECTED Final   Streptococcus pneumoniae NOT DETECTED NOT DETECTED Final   Streptococcus pyogenes NOT DETECTED NOT DETECTED Final   A.calcoaceticus-baumannii NOT DETECTED NOT DETECTED Final   Bacteroides fragilis NOT DETECTED NOT DETECTED Final   Enterobacterales DETECTED (A) NOT DETECTED Final    Comment: Enterobacterales represent a large order of gram negative bacteria, not a single organism. CRITICAL RESULT CALLED TO, READ BACK BY AND VERIFIED WITH: PHARMD N. GOAD [id number] @ 1825 FH    Enterobacter cloacae complex NOT DETECTED NOT DETECTED Final   Escherichia coli DETECTED (A) NOT DETECTED Final    Comment: CRITICAL RESULT CALLED TO, READ BACK BY AND VERIFIED WITH: PHARMD N. GOAD [id number] @ 1825 FH    Klebsiella aerogenes NOT DETECTED NOT DETECTED Final   Klebsiella oxytoca NOT DETECTED NOT DETECTED Final   Klebsiella pneumoniae NOT DETECTED NOT DETECTED Final   Proteus species NOT DETECTED NOT DETECTED Final   Salmonella species NOT DETECTED NOT DETECTED Final   Serratia marcescens NOT DETECTED NOT DETECTED Final   Haemophilus influenzae NOT DETECTED NOT DETECTED Final   Neisseria meningitidis NOT DETECTED NOT DETECTED Final   Pseudomonas aeruginosa NOT DETECTED NOT DETECTED Final    Stenotrophomonas maltophilia NOT DETECTED NOT DETECTED Final   Candida albicans NOT DETECTED NOT DETECTED Final   Candida auris NOT DETECTED NOT DETECTED Final   Candida glabrata NOT DETECTED NOT DETECTED Final   Candida krusei NOT DETECTED NOT DETECTED Final   Candida parapsilosis NOT DETECTED NOT DETECTED Final   Candida tropicalis NOT DETECTED NOT DETECTED Final   Cryptococcus neoformans/gattii NOT DETECTED NOT DETECTED Final   CTX-M ESBL NOT DETECTED NOT DETECTED Final   Carbapenem resistance IMP NOT DETECTED NOT DETECTED Final   Carbapenem resistance KPC NOT DETECTED NOT DETECTED Final   Carbapenem resistance NDM NOT DETECTED NOT DETECTED Final   Carbapenem resist OXA 48 LIKE NOT DETECTED NOT DETECTED Final   Carbapenem resistance VIM NOT DETECTED NOT DETECTED Final    Comment: Performed at [facility], [address], [place], [place] [address]     Labs:  CBC: Recent Labs  Lab 12/27/23 0320 12/28/23 0833 12/29/23 0501 12/30/23 0517  WBC 17.4* 13.0* 5.9 7.6  NEUTROABS  --   --  5.0  --   HGB 13.9 12.6 12.0 11.6*  HCT 41.1 37.4 35.4* 35.1*  MCV 95.8 97.9 96.5 96.7  PLT 319 260 198 214   BMP &GFR Recent Labs  Lab 12/27/23 0320 12/28/23 0833 12/29/23 0501 12/30/23 0517  NA 133* 135 133* 133*  K 3.9 3.9 3.7 3.5  CL 101 98 100 96*  CO2 17* 24 24 26   GLUCOSE 190* 184* 180* 166*  BUN 18 16 18 14   CREATININE 0.96 1.02* 0.90 0.73  CALCIUM 9.8 8.7* 8.4* 8.5*  MG  --   --  1.5* 1.8  PHOS  --   --  2.2* 1.7*   Estimated Creatinine Clearance: 125 mL/min (by C-G formula based on SCr of 0.73 mg/dL). Liver & Pancreas: Recent Labs  Lab 12/27/23 0320 12/28/23 0833 12/29/23 0501 12/30/23 0517  AST 38 24 34  --   ALT 41 31 36  --   ALKPHOS 67 61 65  --   BILITOT 0.5 0.8 0.7  --  PROT 6.6 6.3* 5.9*  --   ALBUMIN 3.6 2.9* 2.6* 2.5*   Recent Labs  Lab 12/27/23 0320  LIPASE 29   No results for input(s): AMMONIA in the last 168 hours. Diabetic: Recent  Labs    12/29/23 0501  HGBA1C 6.7*   Recent Labs  Lab 12/29/23 0444  GLUCAP 152*   Cardiac Enzymes: No results for input(s): CKTOTAL, CKMB, CKMBINDEX, TROPONINI in the last 168 hours. No results for input(s): PROBNP in the last 8760 hours. Coagulation Profile: Recent Labs  Lab 12/27/23 0320  INR 1.0   Thyroid  Function Tests: No results for input(s): TSH, T4TOTAL, FREET4, T3FREE, THYROIDAB in the last 72 hours. Lipid Profile: No results for input(s): CHOL, HDL, LDLCALC, TRIG, CHOLHDL, LDLDIRECT in the last 72 hours. Anemia Panel: No results for input(s): VITAMINB12, FOLATE, FERRITIN, TIBC, IRON, RETICCTPCT in the last 72 hours. Urine analysis:    Component Value Date/Time   COLORURINE YELLOW 12/27/2023 0406   APPEARANCEUR HAZY (A) 12/27/2023 0406   LABSPEC 1.011 12/27/2023 0406   PHURINE 6.0 12/27/2023 0406   GLUCOSEU NEGATIVE 12/27/2023 0406   HGBUR MODERATE (A) 12/27/2023 0406   BILIRUBINUR NEGATIVE 12/27/2023 0406   KETONESUR NEGATIVE 12/27/2023 0406   PROTEINUR 30 (A) 12/27/2023 0406   UROBILINOGEN 0.2 11/20/2009 0850   NITRITE POSITIVE (A) 12/27/2023 0406   LEUKOCYTESUR LARGE (A) 12/27/2023 0406   Sepsis Labs: Invalid input(s): PROCALCITONIN, LACTICIDVEN   SIGNED:  Damiean Lukes T Maddyx Vallie, MD  [facility] 12/30/2023, 9:24 AM

## 2023-12-30 NOTE — Progress Notes (Signed)
 Order to discharge patient home. Family at bedside. Discharge instructions/AVS given to and reviewed with patient by discharge nurse . Education provided as needed . Patient verbalized understanding. 1 PIV removed by the RN.Medications were picked up by the discharge nurse.Personal belongings sent home with the patient. Home via private vehicle.

## 2023-12-30 NOTE — Progress Notes (Addendum)
 DISCHARGE NOTE HOME Vanessa Bean to be discharged Home per MD order. Discussed prescriptions and follow up appointments with the patient. Prescriptions given to patient; medication list explained in detail. Patient verbalized understanding.  Skin clean, dry and intact without evidence of skin break down, no evidence of skin tears noted. IV catheter discontinued intact. Site without signs and symptoms of complications. Dressing and pressure applied. Pt denies pain at the site currently. No complaints noted.  See LDA for incision Patient free of lines, drains, and wounds.   An After Visit Summary (AVS) was printed and given to the patient. Patient escorted via wheelchair, and discharged home via private auto.  [name], RN

## 2024-01-03 ENCOUNTER — Telehealth: Payer: Self-pay

## 2024-01-03 NOTE — Transitions of Care (Post Inpatient/ED Visit) (Signed)
   01/03/2024  Name: Vanessa Bean MRN: [id number] DOB: 06/09/1976  Today's TOC FU Call Status: Today's TOC FU Call Status:: Unsuccessful Call (1st Attempt) Unsuccessful Call (1st Attempt) Date: 01/03/24  Attempted to reach the patient regarding the most recent Inpatient/ED visit. Left a HIPAA approved voicemail message to phone number provided in demographics per DPR.    Follow Up Plan: Additional outreach attempts will be made to reach the patient to complete the Transitions of Care (Post Inpatient/ED visit) call.   Richerd Fish, RN, BSN, CCM [facility], [facility] RN Care Manager Direct Dial: [phone]

## 2024-01-04 ENCOUNTER — Other Ambulatory Visit: Payer: Self-pay | Admitting: Urology

## 2024-01-04 ENCOUNTER — Telehealth: Payer: Self-pay

## 2024-01-04 ENCOUNTER — Encounter (HOSPITAL_COMMUNITY): Payer: Self-pay | Admitting: Urology

## 2024-01-04 NOTE — Transitions of Care (Post Inpatient/ED Visit) (Signed)
   01/04/2024  Name: Vanessa Bean MRN: [id number] DOB: 10-06-1975  Today's TOC FU Call Status: Today's TOC FU Call Status:: Unsuccessful Call (2nd Attempt) Unsuccessful Call (2nd Attempt) Date: 01/04/24  Attempted to reach the patient regarding the most recent Inpatient/ED visit. Left a HIPAA approved voicemail message to phone number provided in demographics per DPR.    Follow Up Plan: Additional outreach attempts will be made to reach the patient to complete the Transitions of Care (Post Inpatient/ED visit) call.   Richerd Fish, RN, BSN, CCM [facility], [facility] RN Care Manager Direct Dial: [phone]

## 2024-01-04 NOTE — Progress Notes (Signed)
 Spoke w/ via phone for pre-op interview--- [facility] needs dos----    UPT, CBG and BMP per anesthesia.     Lab results------ Current EKG dated 12/28/23 and A1C of 6.7 dated 12/29/23. COVID test -----patient states asymptomatic no test needed Arrive at -------0530 NPO after MN NO Solid Food.   Pre-Surgery Ensure or G2:  Med rec completed Medications to take morning of surgery ----- Buspar , Cymbalta , Levaquin  and Metoprolol . Diabetic medication -----  GLP1 agonist last dose: GLP1 instructions:  Patient instructed no nail polish to be worn day of surgery Patient instructed to bring photo id and insurance card day of surgery Patient aware to have Driver (ride ) / caregiver    for 24 hours after surgery - Husband [employer] Patient Special Instructions ----- Shower with antibacterial soap. Pre-Op special Instructions -----  Patient verbalized understanding of instructions that were given at this phone interview. Patient denies chest pain, sob, fever, cough at the interview.

## 2024-01-05 ENCOUNTER — Telehealth: Payer: Self-pay

## 2024-01-05 NOTE — Transitions of Care (Post Inpatient/ED Visit) (Signed)
 01/05/2024  Name: Vanessa Bean MRN: [id number] DOB: 1975-12-04  Today's TOC FU Call Status: Today's TOC FU Call Status:: Successful TOC FU Call Completed TOC FU Call Complete Date: 01/05/24 Patient's Name and Date of Birth confirmed.  Transition Care Management Follow-up Telephone Call Date of Discharge: 12/30/23 Discharge Facility: [facility]) Type of Discharge: Inpatient Admission Primary Inpatient Discharge Diagnosis:: Obstructive pyelonephritis; Sepsis How have you been since you were released from the hospital?: Same (this kidney stone was giving me a fit yesterday and had to call the urologist for medication) Any questions or concerns?: No  Items Reviewed: Did you receive and understand the discharge instructions provided?: Yes Medications obtained,verified, and reconciled?: Yes (Medications Reviewed) Any new allergies since your discharge?: No Dietary orders reviewed?: NA Do you have support at home?: Yes People in Home [RPT]: spouse Name of Support/Comfort Primary Source: Reyes  Medications Reviewed Today: Medications Reviewed Today     Reviewed by Eilleen Richerd GRADE, RN (Registered Nurse) on 01/05/24 at 1205  Med List Status: <None>   Medication Order Taking? Sig Documenting Provider Last Dose Status Informant  acetaminophen  (TYLENOL ) 500 MG tablet [id number]  Take 1,000 mg by mouth every 6 (six) hours as needed for mild pain (pain score 1-3), moderate pain (pain score 4-6), fever or headache. [provider]  Active Self, Pharmacy Records  busPIRone  (BUSPAR ) 10 MG tablet [id number]  Take 1 tablet (10 mg total) by mouth 2 (two) times daily. Wendee Lynwood HERO, NP  Active Self, Pharmacy Records  D-Mannose 500 MG CAPS [id number]  Take 500 mg by mouth daily. [provider]  Active Self, Pharmacy Records  DULoxetine  (CYMBALTA ) 60 MG capsule [id number]  Take 1 capsule by mouth once daily Wendee Lynwood HERO, NP  Active Self, Pharmacy Records  levofloxacin   (LEVAQUIN ) 750 MG tablet [id number]  Take 1 tablet (750 mg total) by mouth daily for 10 days. Gonfa, Taye T, MD  Active   meclizine  (ANTIVERT ) 25 MG tablet [id number]  Take 1 tablet (25 mg total) by mouth 3 (three) times daily as needed for dizziness. Long, Fonda MATSU, MD  Active Self, Pharmacy Records  metoprolol  succinate (TOPROL -XL) 50 MG 24 hr tablet [id number]  Take 1.5 tablets (75 mg total) by mouth daily. Take with or immediately following a meal. Cable, Lynwood HERO, NP  Active Self, Pharmacy Records  Multiple Vitamin (MULTIVITAMIN WITH MINERALS) TABS tablet [id number]  Take 1 tablet by mouth daily. [provider]  Active Self, Pharmacy Records  olmesartan  (BENICAR ) 20 MG tablet [id number]  Take 1 tablet (20 mg total) by mouth daily. Wendee Lynwood HERO, NP  Active Self, Pharmacy Records  triamterene -hydrochlorothiazide  (MAXZIDE -25) 37.5-25 MG tablet [id number]  Take 1 tablet by mouth once daily Wendee Lynwood HERO, NP  Active Self, Pharmacy Records  valACYclovir  (VALTREX ) 1000 MG tablet [id number]  Take 2 tablets (2,000 mg total) by mouth 2 (two) times daily. Wendee Lynwood HERO, NP  Active Self, Pharmacy Records           Med Note (LEE, NICOLE   Mon Dec 27, 2023  7:01 AM) Last dose was 2 months ago.               Follow up appointments reviewed: PCP Follow-up appointment confirmed?: Yes Date of PCP follow-up appointment?: 01/06/24 Follow-up Provider: Lynwood Wendee, NP Specialist Hospital Follow-up appointment confirmed?: Yes Date of Specialist follow-up appointment?: 01/11/24 Follow-Up Specialty Provider:: Stone to be removed Do you need transportation to your follow-up appointment?: No Do you understand  care options if your condition(s) worsen?: Yes-patient verbalized understanding (I've called the urologist about something for pain til my procedure)   Goals Addressed             This Visit's Progress    [facility] Transitions of Care (TOC) Care Plan       Problems:  Recent Hospitalization for  treatment of Sepsis UTI with obstructive pyelonephritis  Post hospital follow up for high risk needs  Goal:  Over the next 30 days, the patient will not experience hospital readmission  Interventions:  Transitions of Care: Contacted provider for patient needs for symptoms with infection, pain , and difficulty urinating Reviewed Signs and symptoms of infection  Patient Self Care Activities:  Attend all scheduled provider appointments Call pharmacy for medication refills 3-7 days in advance of running out of medications Call provider office for new concerns or questions  Notify RN Care Manager of TOC call rescheduling needs Participate in Transition of Care Program/Attend [facility] scheduled calls  Plan:  The care management team will reach out to the patient again over the next 5-7 business days.         Richerd Fish, RN, BSN, CCM [facility], [facility] RN Care Manager Direct Dial: [phone]

## 2024-01-06 ENCOUNTER — Other Ambulatory Visit: Payer: Self-pay | Admitting: Urology

## 2024-01-06 ENCOUNTER — Ambulatory Visit (INDEPENDENT_AMBULATORY_CARE_PROVIDER_SITE_OTHER): Payer: Self-pay | Admitting: Nurse Practitioner

## 2024-01-06 ENCOUNTER — Ambulatory Visit: Payer: Self-pay | Admitting: Urology

## 2024-01-06 VITALS — BP 128/90 | HR 93 | Temp 97.9°F | Ht 64.0 in | Wt 308.6 lb

## 2024-01-06 DIAGNOSIS — L819 Disorder of pigmentation, unspecified: Secondary | ICD-10-CM

## 2024-01-06 DIAGNOSIS — N201 Calculus of ureter: Secondary | ICD-10-CM

## 2024-01-06 DIAGNOSIS — Z09 Encounter for follow-up examination after completed treatment for conditions other than malignant neoplasm: Secondary | ICD-10-CM | POA: Insufficient documentation

## 2024-01-06 DIAGNOSIS — N200 Calculus of kidney: Secondary | ICD-10-CM

## 2024-01-06 DIAGNOSIS — E119 Type 2 diabetes mellitus without complications: Secondary | ICD-10-CM

## 2024-01-06 DIAGNOSIS — K219 Gastro-esophageal reflux disease without esophagitis: Secondary | ICD-10-CM

## 2024-01-06 DIAGNOSIS — I10 Essential (primary) hypertension: Secondary | ICD-10-CM

## 2024-01-06 MED ORDER — PANTOPRAZOLE SODIUM 40 MG PO TBEC
40.0000 mg | DELAYED_RELEASE_TABLET | Freq: Every day | ORAL | 1 refills | Status: DC
Start: 2024-01-06 — End: 2024-02-21

## 2024-01-06 NOTE — Assessment & Plan Note (Signed)
 Did review hospital notes, imaging, and most recent labs.

## 2024-01-06 NOTE — Progress Notes (Signed)
 Acute Office Visit  Subjective:     Patient ID: Vanessa Bean, female    DOB: 1975/07/13, 48 y.o.   MRN: [id number]  Chief Complaint  Patient presents with   Hospitalization Follow-up    HPI Patient is in today for hospital follow-up  Patient was admitted to the hospital on 12/27/2023 after she presented to the emergency department for intractable nausea.  She thought it was due to overdosing on alcohol and appropriate party..  Patient underwent blood work urine and CT scan.  CT abdomen pelvis showed acute left-sided obstructive uropathy with 2 UPJ stones the larger of which was 8 mm patient was mated to medicine and Dr. Carolee from urology was consulted.  Patient was discharged on 12/29/2021.  She is status post arrange outpatient follow-up with urology.  They wanted to consider starting metformin since A1c was 6.7% in the hospital.  During hospitalization she was IV ceftriaxone  for 4 days and then was to be Levaquin  for 10 days to complete an oral course of 14 days of medication.  Patient here to follow-up  Patient is doing okay.  She is having some pain.  I did place her on Celebrex, tamsulosin , tramadol.  Patient not picked up tramadol yet today.  We discussed about limiting her calcium containing foods.  Patient states she has been having some heartburn since being put on medicines from the hospital.  She has been taking Tums.  Review of Systems  Constitutional:  Negative for chills and fever.  Respiratory:  Negative for shortness of breath.   Cardiovascular:  Negative for chest pain.  Gastrointestinal:  Positive for abdominal pain.  Neurological:  Negative for headaches.        Objective:    BP (!) 128/90   Pulse 93   Temp 97.9 F (36.6 C) (Oral)   Ht 5' 4 (1.626 m)   Wt (!) 308 lb 9.6 oz (140 kg)   LMP 12/09/2023   SpO2 98%   BMI 52.97 kg/m  BP Readings from Last 3 Encounters:  01/06/24 (!) 128/90  12/30/23 130/78  10/22/23 136/80   Wt Readings from Last 3  Encounters:  01/04/24 (!) 316 lb (143.3 kg)  01/06/24 (!) 308 lb 9.6 oz (140 kg)  12/27/23 (!) (P) 316 lb (143.3 kg)   SpO2 Readings from Last 3 Encounters:  01/06/24 98%  12/30/23 90%  10/22/23 94%      Physical Exam Vitals and nursing note reviewed.  Constitutional:      Appearance: Normal appearance.  Cardiovascular:     Rate and Rhythm: Normal rate and regular rhythm.     Heart sounds: Normal heart sounds.  Pulmonary:     Effort: Pulmonary effort is normal.     Breath sounds: Normal breath sounds.  Abdominal:     General: Bowel sounds are normal.  Neurological:     Mental Status: She is alert.     No results found for any visits on 01/06/24.      Assessment & Plan:   Problem List Items Addressed This Visit       Cardiovascular and Mediastinum   Primary hypertension   Patient's blood pressure elevated upon initial and recheck.  Likely secondary to pain due to active kidney stone. Continue taking metoprolol  50 mg daily along with olmesartan  20 mg daily and Maxide 25 daily        Digestive   Gastroesophageal reflux disease   Patient been having reflux in the using Tums.  Informed patient  to discontinue Tums and Rolaids as they are calcium containing.  Will do Protonix  40 mg daily for 30 to 60 days      Relevant Medications   pantoprazole  (PROTONIX ) 40 MG tablet     Endocrine   Diet-controlled type 2 diabetes mellitus (HCC)   New diagnosis in the hospital with A1c of 6.7%.  Patient will work on limiting carbohydrates and avoiding plain/processed sugars.  Will give patient 3 months worth of lifestyle modifications if A1c is elevated at next check consider medication at that juncture      Relevant Orders   CBC   Comprehensive metabolic panel with GFR     Musculoskeletal and Integument   Discoloration of skin of foot   History of the same.  Patient was seen by podiatry and felt to have Raynaud's phenomenon.  Patient describes pain and redness with increased  use due to ABIs to rule out occlusive disease.      Relevant Orders   VAS US  ABI WITH/WO TBI     Genitourinary   Kidney stone   History of same.  Patient has follow-up with urology on 01/11/2024.      Relevant Medications   traMADol (ULTRAM) 50 MG tablet     Other   Hospital discharge follow-up - Primary   Did review hospital notes, imaging, and most recent labs.       Meds ordered this encounter  Medications   pantoprazole  (PROTONIX ) 40 MG tablet    Sig: Take 1 tablet (40 mg total) by mouth daily.    Dispense:  30 tablet    Refill:  1    Supervising Provider:   RANDEEN HARDY A [[id number]]    Return in about 3 months (around 04/07/2024) for DM recheck.  Adina Crandall, NP

## 2024-01-06 NOTE — Progress Notes (Deleted)
 Assessment: 1. Ureteral calculus, left   2. History of UTI      Plan: I personally reviewed the patient's chart including provider notes, labs and imaging results.    Chief Complaint: No chief complaint on file.   History of Present Illness:  Vanessa Bean is a 48 y.o. female who is seen in consultation from Arlyss Foot, MD for evaluation of left ureteral calculus.  She presented to the emergency room at [facility] on 12/27/2023.  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  She was also diagnosed with sepsis.  She underwent cystoscopy with left ureteral stent placement on 12/27/2023 by Dr. Foot.   Urine and blood cultures grew E. coli.  She was treated with IV ceftriaxone  and discharged on Levaquin .  Past Medical History:  Past Medical History:  Diagnosis Date   Diabetes mellitus without complication (HCC)    History of bronchitis    History of kidney stones    Hypertension     Past Surgical History:  Past Surgical History:  Procedure Laterality Date   ANKLE FRACTURE SURGERY Right    CESAREAN SECTION     CHOLECYSTECTOMY     CYSTOSCOPY W/ RETROGRADES Right 11/07/2019   Procedure: CYSTOSCOPY RIGHT URETERAL STENT PLACEMENT WITH RETROGRADE PYELOGRAM;  Surgeon: Watt Rush, MD;  Location: [facility];  Service: Urology;  Laterality: Right;   CYSTOSCOPY W/ URETERAL STENT PLACEMENT Left 12/27/2023   Procedure: CYSTOSCOPY, WITH RETROGRADE PYELOGRAM AND URETERAL STENT INSERTION;  Surgeon: Foot Arlyss CROME, MD;  Location: [facility];  Service: Urology;  Laterality: Left;   CYSTOSCOPY/URETEROSCOPY/HOLMIUM LASER/STENT PLACEMENT Right 11/23/2019   Procedure: RIGHT URETEROSCOPY//STENT EXCHANGE;  Surgeon: Watt Rush, MD;  Location: [facility];  Service: Urology;  Laterality: Right;   DILATION AND CURETTAGE OF UTERUS      Allergies:  No Known Allergies  Family History:  Family History   Problem Relation Age of Onset   Heart disease Mother        CHF   Hypertension Mother    Diabetes Mother    Alcohol abuse Father    Schizophrenia Father    Bipolar disorder Father    Diabetes Maternal Grandmother    Cancer Maternal Grandmother        Breast cancer    Social History:  Social History   Tobacco Use   Smoking status: Former    Current packs/day: 0.00    Types: Cigarettes    Quit date: 2015    Years since quitting: 10.5   Smokeless tobacco: Never  Vaping Use   Vaping status: Never Used  Substance Use Topics   Alcohol use: Not Currently    Comment: Twice a month with all 3  2-3   Drug use: No    Review of symptoms:  Constitutional:  Negative for unexplained weight loss, night sweats, fever, chills ENT:  Negative for nose bleeds, sinus pain, painful swallowing CV:  Negative for chest pain, shortness of breath, exercise intolerance, palpitations, loss of consciousness Resp:  Negative for cough, wheezing, shortness of breath GI:  Negative for nausea, vomiting, diarrhea, bloody stools GU:  Positives noted in HPI; otherwise negative for gross hematuria, dysuria, urinary incontinence Neuro:  Negative for seizures, poor balance, limb weakness, slurred speech Psych:  Negative for lack of energy, depression, anxiety Endocrine:  Negative for polydipsia, polyuria, symptoms of hypoglycemia (dizziness, hunger, sweating) Hematologic:  Negative for anemia,  purpura, petechia, prolonged or excessive bleeding, use of anticoagulants  Allergic:  Negative for difficulty breathing or choking as a result of exposure to anything; no shellfish allergy; no allergic response (rash/itch) to materials, foods  Physical exam: LMP 12/09/2023  GENERAL APPEARANCE:  Well appearing, well developed, well nourished, NAD HEENT: Atraumatic, Normocephalic, oropharynx clear. NECK: Supple without lymphadenopathy or thyromegaly. LUNGS: Clear to auscultation bilaterally. HEART: Regular Rate and  Rhythm without murmurs, gallops, or rubs. ABDOMEN: Soft, non-tender, No Masses. EXTREMITIES: Moves all extremities well.  Without clubbing, cyanosis, or edema. NEUROLOGIC:  Alert and oriented x 3, normal gait, CN II-XII grossly intact.  MENTAL STATUS:  Appropriate. BACK:  Non-tender to palpation.  No CVAT SKIN:  Warm, dry and intact.    Results: U/A:

## 2024-01-06 NOTE — Assessment & Plan Note (Addendum)
 History of the same.  Patient was seen by podiatry and felt to have Raynaud's phenomenon.  Patient describes pain and redness with increased use due to ABIs to rule out occlusive disease.

## 2024-01-06 NOTE — Assessment & Plan Note (Signed)
 History of same.  Patient has follow-up with urology on 01/11/2024.

## 2024-01-06 NOTE — Assessment & Plan Note (Signed)
 Patient's blood pressure elevated upon initial and recheck.  Likely secondary to pain due to active kidney stone. Continue taking metoprolol  50 mg daily along with olmesartan  20 mg daily and Maxide 25 daily

## 2024-01-06 NOTE — Patient Instructions (Signed)
 Nice to see you today Avoid excess calcium especially rolaids and tums. You can start back on the duloxetine  (cymbalta ) Follow up with me in 3 months  Limit your carbohydrates and avoid plain sugars/processed sugars.. This includes cookies, canides, cakes, sweets, sugary drinks, large amounts of juice

## 2024-01-06 NOTE — Assessment & Plan Note (Signed)
 New diagnosis in the hospital with A1c of 6.7%.  Patient will work on limiting carbohydrates and avoiding plain/processed sugars.  Will give patient 3 months worth of lifestyle modifications if A1c is elevated at next check consider medication at that juncture

## 2024-01-06 NOTE — Assessment & Plan Note (Signed)
 Patient been having reflux in the using Tums.  Informed patient to discontinue Tums and Rolaids as they are calcium containing.  Will do Protonix  40 mg daily for 30 to 60 days

## 2024-01-07 LAB — COMPREHENSIVE METABOLIC PANEL WITH GFR
ALT: 26 U/L (ref 0–35)
AST: 22 U/L (ref 0–37)
Albumin: 4.3 g/dL (ref 3.5–5.2)
Alkaline Phosphatase: 111 U/L (ref 39–117)
BUN: 13 mg/dL (ref 6–23)
CO2: 27 meq/L (ref 19–32)
Calcium: 12.2 mg/dL — ABNORMAL HIGH (ref 8.4–10.5)
Chloride: 96 meq/L (ref 96–112)
Creatinine, Ser: 0.81 mg/dL (ref 0.40–1.20)
GFR: 87.29 mL/min (ref >60.00)
Glucose, Bld: 119 mg/dL — ABNORMAL HIGH (ref 70–99)
Potassium: 4.1 meq/L (ref 3.5–5.1)
Sodium: 134 meq/L — ABNORMAL LOW (ref 135–145)
Total Bilirubin: 0.5 mg/dL (ref 0.2–1.2)
Total Protein: 7.8 g/dL (ref 6.0–8.3)

## 2024-01-07 LAB — CBC
HCT: 38.4 % (ref 36.0–46.0)
Hemoglobin: 13 g/dL (ref 12.0–15.0)
MCHC: 33.9 g/dL (ref 30.0–36.0)
MCV: 95.6 fl (ref 78.0–100.0)
Platelets: 530 K/uL — ABNORMAL HIGH (ref 150.0–400.0)
RBC: 4.02 Mil/uL (ref 3.87–5.11)
RDW: 14 % (ref 11.5–15.5)
WBC: 12 K/uL — ABNORMAL HIGH (ref 4.0–10.5)

## 2024-01-10 ENCOUNTER — Encounter: Payer: Self-pay | Admitting: Nurse Practitioner

## 2024-01-10 NOTE — Anesthesia Preprocedure Evaluation (Signed)
 Anesthesia Evaluation  Patient identified by MRN, date of birth, ID band Patient awake    Reviewed: Allergy & Precautions, NPO status , Patient's Chart, lab work & pertinent test results, reviewed documented beta blocker date and time   History of Anesthesia Complications Negative for: history of anesthetic complications  Airway Mallampati: II  TM Distance: >3 FB Neck ROM: Full    Dental  (+) Dental Advisory Given   Pulmonary former smoker   breath sounds clear to auscultation       Cardiovascular hypertension, Pt. on medications and Pt. on home beta blockers (-) angina  Rhythm:Regular Rate:Normal     Neuro/Psych   Anxiety Depression    negative neurological ROS     GI/Hepatic Neg liver ROS,GERD  Medicated and Controlled,,  Endo/Other  diabetes (glu 125)  Class 4 obesityBMI 53   Renal/GU stones     Musculoskeletal   Abdominal   Peds  Hematology Hb 13, plt 530k   Anesthesia Other Findings   Reproductive/Obstetrics                              Anesthesia Physical Anesthesia Plan  ASA: 3  Anesthesia Plan: General   Post-op Pain Management: Tylenol  PO (pre-op)*   Induction: Intravenous  PONV Risk Score and Plan: 3 and Ondansetron , Dexamethasone  and Scopolamine  patch - Pre-op  Airway Management Planned: LMA  Additional Equipment: None  Intra-op Plan:   Post-operative Plan:   Informed Consent: I have reviewed the patients History and Physical, chart, labs and discussed the procedure including the risks, benefits and alternatives for the proposed anesthesia with the patient or authorized representative who has indicated his/her understanding and acceptance.     Dental advisory given  Plan Discussed with: CRNA and Surgeon  Anesthesia Plan Comments:          Anesthesia Quick Evaluation

## 2024-01-11 ENCOUNTER — Encounter (HOSPITAL_COMMUNITY): Payer: Self-pay | Admitting: Urology

## 2024-01-11 ENCOUNTER — Ambulatory Visit (HOSPITAL_COMMUNITY): Payer: Self-pay | Admitting: Anesthesiology

## 2024-01-11 ENCOUNTER — Ambulatory Visit: Payer: Self-pay | Admitting: Nurse Practitioner

## 2024-01-11 ENCOUNTER — Ambulatory Visit (HOSPITAL_COMMUNITY): Payer: Self-pay

## 2024-01-11 ENCOUNTER — Ambulatory Visit (HOSPITAL_COMMUNITY)
Admission: RE | Admit: 2024-01-11 | Discharge: 2024-01-11 | Disposition: A | Discharge status: Home / Self Care | Payer: Self-pay | Attending: Urology | Admitting: Urology

## 2024-01-11 ENCOUNTER — Encounter (HOSPITAL_COMMUNITY)
Admission: RE | Disposition: A | Discharge status: Home / Self Care | Payer: Self-pay | Source: Home / Self Care | Attending: Urology

## 2024-01-11 DIAGNOSIS — N132 Hydronephrosis with renal and ureteral calculous obstruction: Secondary | ICD-10-CM

## 2024-01-11 DIAGNOSIS — Z6841 Body Mass Index (BMI) 40.0 and over, adult: Secondary | ICD-10-CM | POA: Insufficient documentation

## 2024-01-11 DIAGNOSIS — E119 Type 2 diabetes mellitus without complications: Secondary | ICD-10-CM | POA: Insufficient documentation

## 2024-01-11 DIAGNOSIS — F418 Other specified anxiety disorders: Secondary | ICD-10-CM | POA: Insufficient documentation

## 2024-01-11 DIAGNOSIS — Z79899 Other long term (current) drug therapy: Secondary | ICD-10-CM | POA: Insufficient documentation

## 2024-01-11 DIAGNOSIS — Z87891 Personal history of nicotine dependence: Secondary | ICD-10-CM | POA: Insufficient documentation

## 2024-01-11 DIAGNOSIS — I10 Essential (primary) hypertension: Secondary | ICD-10-CM

## 2024-01-11 DIAGNOSIS — Z833 Family history of diabetes mellitus: Secondary | ICD-10-CM | POA: Insufficient documentation

## 2024-01-11 DIAGNOSIS — K219 Gastro-esophageal reflux disease without esophagitis: Secondary | ICD-10-CM | POA: Insufficient documentation

## 2024-01-11 DIAGNOSIS — E6689 Other obesity not elsewhere classified: Secondary | ICD-10-CM | POA: Insufficient documentation

## 2024-01-11 DIAGNOSIS — Z8249 Family history of ischemic heart disease and other diseases of the circulatory system: Secondary | ICD-10-CM | POA: Insufficient documentation

## 2024-01-11 HISTORY — PX: CYSTOSCOPY/URETEROSCOPY/HOLMIUM LASER/STENT PLACEMENT: SHX6546

## 2024-01-11 HISTORY — DX: Type 2 diabetes mellitus without complications: E11.9

## 2024-01-11 LAB — BASIC METABOLIC PANEL WITH GFR
Anion gap: 14 (ref 5–15)
BUN: 21 mg/dL — ABNORMAL HIGH (ref 6–20)
CO2: 21 mmol/L — ABNORMAL LOW (ref 22–32)
Calcium: 10 mg/dL (ref 8.9–10.3)
Chloride: 104 mmol/L (ref 98–111)
Creatinine, Ser: 0.77 mg/dL (ref 0.44–1.00)
GFR, Estimated: >60 mL/min (ref >60)
Glucose, Bld: 132 mg/dL — ABNORMAL HIGH (ref 70–99)
Potassium: 4 mmol/L (ref 3.5–5.1)
Sodium: 139 mmol/L (ref 135–145)

## 2024-01-11 LAB — GLUCOSE, CAPILLARY
Glucose-Capillary: 125 mg/dL — ABNORMAL HIGH (ref 70–99)
Glucose-Capillary: 142 mg/dL — ABNORMAL HIGH (ref 70–99)

## 2024-01-11 LAB — POCT PREGNANCY, URINE: Preg Test, Ur: NEGATIVE

## 2024-01-11 SURGERY — CYSTOSCOPY/URETEROSCOPY/HOLMIUM LASER/STENT PLACEMENT
Anesthesia: General | Site: Ureter | Laterality: Left

## 2024-01-11 MED ORDER — MIDAZOLAM HCL 2 MG/2ML IJ SOLN
INTRAMUSCULAR | Status: DC | PRN
  Administered 2024-01-11: 2 mg via INTRAVENOUS

## 2024-01-11 MED ORDER — LIDOCAINE 2% (20 MG/ML) 5 ML SYRINGE
INTRAMUSCULAR | Status: DC | PRN
  Administered 2024-01-11: 40 mg via INTRAVENOUS

## 2024-01-11 MED ORDER — DEXAMETHASONE SODIUM PHOSPHATE 10 MG/ML IJ SOLN
INTRAMUSCULAR | Status: DC | PRN
  Administered 2024-01-11: 10 mg via INTRAVENOUS

## 2024-01-11 MED ORDER — CIPROFLOXACIN IN D5W 400 MG/200ML IV SOLN
INTRAVENOUS | Status: AC
  Filled 2024-01-11: qty 200

## 2024-01-11 MED ORDER — FENTANYL CITRATE (PF) 250 MCG/5ML IJ SOLN
INTRAMUSCULAR | Status: AC
  Filled 2024-01-11: qty 5

## 2024-01-11 MED ORDER — SCOPOLAMINE 1 MG/3DAYS TD PT72
MEDICATED_PATCH | TRANSDERMAL | Status: AC
  Filled 2024-01-11: qty 1

## 2024-01-11 MED ORDER — CELECOXIB 200 MG PO CAPS: 200.0000 mg | ORAL_CAPSULE | Freq: Two times a day (BID) | ORAL | 1 refills | Status: DC

## 2024-01-11 MED ORDER — ACETAMINOPHEN 500 MG PO TABS
1000.0000 mg | ORAL_TABLET | Freq: Once | ORAL | Status: AC
  Administered 2024-01-11: 1000 mg via ORAL

## 2024-01-11 MED ORDER — MIDAZOLAM HCL 2 MG/2ML IJ SOLN
INTRAMUSCULAR | Status: AC
  Filled 2024-01-11: qty 2

## 2024-01-11 MED ORDER — OXYCODONE HCL 5 MG/5ML PO SOLN: 5.0000 mg | Freq: Once | ORAL | Status: DC | PRN

## 2024-01-11 MED ORDER — CIPROFLOXACIN IN D5W 400 MG/200ML IV SOLN
400.0000 mg | INTRAVENOUS | Status: AC
  Administered 2024-01-11: 400 mg via INTRAVENOUS

## 2024-01-11 MED ORDER — BUSPIRONE HCL 15 MG PO TABS
15.0000 mg | ORAL_TABLET | Freq: Two times a day (BID) | ORAL | 2 refills | Status: DC
Start: 2024-01-11 — End: 2024-03-27

## 2024-01-11 MED ORDER — HYOSCYAMINE SULFATE 0.125 MG SL SUBL: 0.1250 mg | SUBLINGUAL_TABLET | SUBLINGUAL | 0 refills | Status: DC | PRN

## 2024-01-11 MED ORDER — FENTANYL CITRATE (PF) 250 MCG/5ML IJ SOLN
INTRAMUSCULAR | Status: DC | PRN
  Administered 2024-01-11: 50 ug via INTRAVENOUS
  Administered 2024-01-11: 100 ug via INTRAVENOUS
  Administered 2024-01-11: 50 ug via INTRAVENOUS

## 2024-01-11 MED ORDER — HYDROMORPHONE HCL 1 MG/ML IJ SOLN
INTRAMUSCULAR | Status: AC
  Filled 2024-01-11: qty 1

## 2024-01-11 MED ORDER — MIDAZOLAM HCL 2 MG/2ML IJ SOLN: 0.5000 mg | Freq: Once | INTRAMUSCULAR | Status: DC | PRN

## 2024-01-11 MED ORDER — ONDANSETRON HCL 4 MG/2ML IJ SOLN
INTRAMUSCULAR | Status: AC
  Filled 2024-01-11: qty 2

## 2024-01-11 MED ORDER — IOHEXOL 300 MG/ML  SOLN
INTRAMUSCULAR | Status: DC | PRN
  Administered 2024-01-11: 9 mL via URETHRAL

## 2024-01-11 MED ORDER — ORAL CARE MOUTH RINSE: 15.0000 mL | Freq: Once | OROMUCOSAL | Status: AC

## 2024-01-11 MED ORDER — CHLORHEXIDINE GLUCONATE 0.12 % MT SOLN
15.0000 mL | Freq: Once | OROMUCOSAL | Status: AC
Start: 2024-01-11 — End: 2024-01-11
  Administered 2024-01-11: 15 mL via OROMUCOSAL

## 2024-01-11 MED ORDER — PHENYLEPHRINE 80 MCG/ML (10ML) SYRINGE FOR IV PUSH (FOR BLOOD PRESSURE SUPPORT)
PREFILLED_SYRINGE | INTRAVENOUS | Status: DC | PRN
  Administered 2024-01-11 (×2): 160 ug via INTRAVENOUS
  Administered 2024-01-11: 120 ug via INTRAVENOUS

## 2024-01-11 MED ORDER — LIDOCAINE 2% (20 MG/ML) 5 ML SYRINGE
INTRAMUSCULAR | Status: AC
  Filled 2024-01-11: qty 5

## 2024-01-11 MED ORDER — OXYCODONE HCL 5 MG PO TABS: 5.0000 mg | ORAL_TABLET | Freq: Once | ORAL | Status: DC | PRN

## 2024-01-11 MED ORDER — SODIUM CHLORIDE 0.9 % IR SOLN
Status: DC | PRN
  Administered 2024-01-11: 3000 mL via INTRAVESICAL

## 2024-01-11 MED ORDER — DEXAMETHASONE SODIUM PHOSPHATE 10 MG/ML IJ SOLN
INTRAMUSCULAR | Status: AC
  Filled 2024-01-11: qty 1

## 2024-01-11 MED ORDER — ACETAMINOPHEN 500 MG PO TABS
ORAL_TABLET | ORAL | Status: AC
  Filled 2024-01-11: qty 2

## 2024-01-11 MED ORDER — CEPHALEXIN 500 MG PO CAPS: 500.0000 mg | ORAL_CAPSULE | Freq: Two times a day (BID) | ORAL | 0 refills | Status: DC

## 2024-01-11 MED ORDER — PROPOFOL 10 MG/ML IV BOLUS
INTRAVENOUS | Status: DC | PRN
  Administered 2024-01-11: 100 mg via INTRAVENOUS
  Administered 2024-01-11: 200 mg via INTRAVENOUS

## 2024-01-11 MED ORDER — CHLORHEXIDINE GLUCONATE 0.12 % MT SOLN
OROMUCOSAL | Status: AC
  Filled 2024-01-11: qty 15

## 2024-01-11 MED ORDER — HYDROMORPHONE HCL 1 MG/ML IJ SOLN
0.2500 mg | INTRAMUSCULAR | Status: DC | PRN
  Administered 2024-01-11: 0.25 mg via INTRAVENOUS

## 2024-01-11 MED ORDER — MEPERIDINE HCL 25 MG/ML IJ SOLN: 6.2500 mg | INTRAMUSCULAR | Status: DC | PRN

## 2024-01-11 MED ORDER — INSULIN ASPART 100 UNIT/ML IJ SOLN: 0.0000 [IU] | INTRAMUSCULAR | Status: DC | PRN

## 2024-01-11 MED ORDER — SCOPOLAMINE 1 MG/3DAYS TD PT72
1.0000 | MEDICATED_PATCH | TRANSDERMAL | Status: DC
  Administered 2024-01-11: 1.5 mg via TRANSDERMAL

## 2024-01-11 MED ORDER — LACTATED RINGERS IV SOLN: INTRAVENOUS | Status: DC

## 2024-01-11 MED ORDER — PROPOFOL 10 MG/ML IV BOLUS
INTRAVENOUS | Status: AC
  Filled 2024-01-11: qty 20

## 2024-01-11 MED ORDER — ONDANSETRON HCL 4 MG/2ML IJ SOLN
INTRAMUSCULAR | Status: DC | PRN
  Administered 2024-01-11: 4 mg via INTRAVENOUS

## 2024-01-11 MED ORDER — EPHEDRINE SULFATE-NACL 50-0.9 MG/10ML-% IV SOSY
PREFILLED_SYRINGE | INTRAVENOUS | Status: DC | PRN
  Administered 2024-01-11: 5 mg via INTRAVENOUS

## 2024-01-11 MED ORDER — TRAMADOL HCL 50 MG PO TABS: 50.0000 mg | ORAL_TABLET | Freq: Four times a day (QID) | ORAL | 0 refills | Status: DC | PRN

## 2024-01-11 MED ORDER — PROPOFOL 10 MG/ML IV BOLUS
INTRAVENOUS | Status: AC
Start: 2024-01-11 — End: 2024-01-11
  Filled 2024-01-11: qty 20

## 2024-01-11 SURGICAL SUPPLY — 26 items
BAG DRAIN URO-CYSTO SKYTR STRL (DRAIN) ×2 IMPLANT
BASKET ZERO TIP NITINOL 2.4FR (BASKET) IMPLANT
BENZOIN TINCTURE PRP APPL 2/3 (GAUZE/BANDAGES/DRESSINGS) IMPLANT
CATH URETERAL DUAL LUMEN 10F (MISCELLANEOUS) IMPLANT
CATH URETL OPEN END 6FR 70 (CATHETERS) ×2 IMPLANT
DRSG TEGADERM 4X4.75 (GAUZE/BANDAGES/DRESSINGS) IMPLANT
ELECTRODE REM PT RTRN 9FT ADLT (ELECTROSURGICAL) IMPLANT
EXTRACTOR STONE NITINOL NGAGE (UROLOGICAL SUPPLIES) IMPLANT
FIBER LASER FLEXIVA 365 (UROLOGICAL SUPPLIES) IMPLANT
FIBER LASER TRACTIP 200 (UROLOGICAL SUPPLIES) IMPLANT
GLOVE BIO SURGEON STRL SZ7 (GLOVE) ×2 IMPLANT
GLOVE BIOGEL PI IND STRL 7.5 (GLOVE) IMPLANT
GOWN STRL REUS W/ TWL LRG LVL3 (GOWN DISPOSABLE) ×2 IMPLANT
GOWN STRL REUS W/ TWL XL LVL3 (GOWN DISPOSABLE) IMPLANT
GUIDEWIRE STR DUAL SENSOR (WIRE) ×2 IMPLANT
GUIDEWIRE ZIPWRE .038 STRAIGHT (WIRE) IMPLANT
KIT TURNOVER KIT B (KITS) ×2 IMPLANT
MANIFOLD NEPTUNE II (INSTRUMENTS) ×2 IMPLANT
NS IRRIG 500ML POUR BTL (IV SOLUTION) ×2 IMPLANT
PACK CYSTO (CUSTOM PROCEDURE TRAY) ×2 IMPLANT
SHEATH NAVIGATOR HD 11/13X36 (SHEATH) IMPLANT
SLEEVE SCD COMPRESS KNEE MED (STOCKING) ×2 IMPLANT
SOL .9 NS 3000ML IRR UROMATIC (IV SOLUTION) ×4 IMPLANT
STENT CONTOUR 6FRX24X.038 (STENTS) IMPLANT
TUBE CONNECTING 12X1/4 (SUCTIONS) IMPLANT
TUBING UROLOGY SET (TUBING) ×2 IMPLANT

## 2024-01-11 NOTE — H&P (Signed)
 H&P  History of Present Illness: Vanessa Bean is a 48 y.o. year old F who presents today for treatment of a left ureteral stone  No acute complaints  Past Medical History:  Diagnosis Date   Diabetes mellitus without complication (HCC)    History of bronchitis    History of kidney stones    Hypertension     Past Surgical History:  Procedure Laterality Date   ANKLE FRACTURE SURGERY Right    CESAREAN SECTION     CHOLECYSTECTOMY     CYSTOSCOPY W/ RETROGRADES Right 11/07/2019   Procedure: CYSTOSCOPY RIGHT URETERAL STENT PLACEMENT WITH RETROGRADE PYELOGRAM;  Surgeon: Watt Rush, MD;  Location: [facility];  Service: Urology;  Laterality: Right;   CYSTOSCOPY W/ URETERAL STENT PLACEMENT Left 12/27/2023   Procedure: CYSTOSCOPY, WITH RETROGRADE PYELOGRAM AND URETERAL STENT INSERTION;  Surgeon: Lovie Arlyss CROME, MD;  Location: [facility];  Service: Urology;  Laterality: Left;   CYSTOSCOPY/URETEROSCOPY/HOLMIUM LASER/STENT PLACEMENT Right 11/23/2019   Procedure: RIGHT URETEROSCOPY//STENT EXCHANGE;  Surgeon: Watt Rush, MD;  Location: [facility];  Service: Urology;  Laterality: Right;   DILATION AND CURETTAGE OF UTERUS      Home Medications:  Current Meds  Medication Sig   busPIRone  (BUSPAR ) 10 MG tablet Take 1 tablet (10 mg total) by mouth 2 (two) times daily.   D-Mannose 500 MG CAPS Take 500 mg by mouth daily.   DULoxetine  (CYMBALTA ) 60 MG capsule Take 1 capsule by mouth once daily   meclizine  (ANTIVERT ) 25 MG tablet Take 1 tablet (25 mg total) by mouth 3 (three) times daily as needed for dizziness.   metoprolol  succinate (TOPROL -XL) 50 MG 24 hr tablet Take 1.5 tablets (75 mg total) by mouth daily. Take with or immediately following a meal.   olmesartan  (BENICAR ) 20 MG tablet Take 1 tablet (20 mg total) by mouth daily.   pantoprazole  (PROTONIX ) 40 MG tablet Take 1 tablet (40 mg total) by mouth daily.   traMADol  (ULTRAM ) 50 MG tablet Take 50 mg by mouth every 6 (six) hours as needed.    triamterene -hydrochlorothiazide  (MAXZIDE -25) 37.5-25 MG tablet Take 1 tablet by mouth once daily    Allergies: No Known Allergies  Family History  Problem Relation Age of Onset   Heart disease Mother        CHF   Hypertension Mother    Diabetes Mother    Alcohol abuse Father    Schizophrenia Father    Bipolar disorder Father    Diabetes Maternal Grandmother    Cancer Maternal Grandmother        Breast cancer    Social History:  reports that she quit smoking about 10 years ago. Her smoking use included cigarettes. She has never used smokeless tobacco. She reports that she does not currently use alcohol. She reports that she does not use drugs.  ROS: A complete review of systems was performed.  All systems are negative except for pertinent findings as noted.  Physical Exam:  Vital signs in last 24 hours: Temp:  [98.4 F (36.9 C)] 98.4 F (36.9 C) (07/15 0606) Pulse Rate:  [101] 101 (07/15 0606) Resp:  [18] 18 (07/15 0606) BP: (149)/(87) 149/87 (07/15 0606) SpO2:  [93 %] 93 % (07/15 0606) Weight:  [94.3 kg] 94.3 kg (07/15 0606) Constitutional:  Alert and oriented, No acute distress Cardiovascular: Regular rate and rhythm Respiratory: Normal respiratory effort, Lungs clear bilaterally GI: Abdomen is soft, nontender, nondistended, no abdominal masses Lymphatic: No lymphadenopathy Neurologic: Grossly intact, no focal deficits Psychiatric:  Normal mood and affect   Laboratory Data:  No results for input(s): WBC, HGB, HCT, PLT in the last 72 hours.  Recent Labs    01/11/24 0619  NA 139  K 4.0  CL 104  GLUCOSE 132*  BUN 21*  CALCIUM 10.0  CREATININE 0.77     Results for orders placed or performed during the hospital encounter of 01/11/24 (from the past 24 hours)  Pregnancy, urine POC     Status: None   Collection Time: 01/11/24  5:58 AM  Result Value Ref Range   Preg Test, Ur NEGATIVE NEGATIVE  Basic metabolic panel per protocol     Status: Abnormal    Collection Time: 01/11/24  6:19 AM  Result Value Ref Range   Sodium 139 135 - 145 mmol/L   Potassium 4.0 3.5 - 5.1 mmol/L   Chloride 104 98 - 111 mmol/L   CO2 21 (L) 22 - 32 mmol/L   Glucose, Bld 132 (H) 70 - 99 mg/dL   BUN 21 (H) 6 - 20 mg/dL   Creatinine, Ser 9.22 0.44 - 1.00 mg/dL   Calcium 89.9 8.9 - 89.6 mg/dL   GFR, Estimated >39 >39 mL/min   Anion gap 14 5 - 15  Glucose, capillary     Status: Abnormal   Collection Time: 01/11/24  6:22 AM  Result Value Ref Range   Glucose-Capillary 125 (H) 70 - 99 mg/dL   No results found for this or any previous visit (from the past 240 hours).  Renal Function: Recent Labs    01/06/24 1543 01/11/24 0619  CREATININE 0.81 0.77   Estimated Creatinine Clearance: 97.8 mL/min (by C-G formula based on SCr of 0.77 mg/dL).  Radiologic Imaging: No results found.  Assessment:  Vanessa Bean is a 48 y.o. year old F with left ureteral stone; s/p stent placement several weeks ago  Plan:  --to OR as planned for left ureteroscopy with laser litho, stent. Procedure and risks reviewed, including but not limited to hematuria, infection, sepsis, damage to GU tract, failure to complete procedure, retained stone fragments, need for future procedures, stent pain, prolonged stent.   Herlene Foot, MD 01/11/2024, 7:40 AM  [facility] Pager: [phone]

## 2024-01-11 NOTE — Discharge Instructions (Addendum)
[facility] ?[phone] ?Post Ureteroscopy With or Without Stent Instructions ? ?Definitions: ? ?Ureter: The duct that transports urine from the kidney to the bladder. ?Stent:   A plastic hollow tube that is placed into the ureter, from the kidney to the bladder to prevent the ureter from swelling shut. ? ?GENERAL INSTRUCTIONS: ? ?Despite the fact that no skin incisions were used, the area around the ureter and bladder is raw and irritated. The stent is a foreign body which will further irritate the bladder wall. This irritation is manifested by increased frequency of urination, both day and night, and by an increase in the urge to urinate. In some, the urge to urinate is present almost always. Sometimes the urge is strong enough that you may not be able to stop yourself from urinating. The only real cure is to remove the stent and then give time for the bladder wall to heal which can't be done until the danger of the ureter swelling shut has passed, which varies. ? ?You may see some blood in your urine while the stent is in place and a few days afterwards. Do not be alarmed, even if the urine was clear for a while. Get off your feet and drink lots of fluids until clearing occurs. If you start to pass clots or don't improve, call us. ? ?DIET: ?You may return to your normal diet immediately. Because of the raw surface of your bladder, alcohol, spicy foods, acid type foods and drinks with caffeine may cause irritation or frequency and should be used in moderation. To keep your urine flowing freely and to avoid constipation, drink plenty of fluids during the day ( 8-10 glasses ). ?Tip: Avoid cranberry juice because it is very acidic. ? ?ACTIVITY: ?Your physical activity doesn't need to be restricted. However, if you are very active, you may see some blood in your urine. We suggest that you reduce your activity under these circumstances until the bleeding has stopped. ? ?BOWELS: ?It is important to  keep your bowels regular during the postoperative period. Straining with bowel movements can cause bleeding. A bowel movement every other day is reasonable. Use a mild laxative if needed, such as Milk of Magnesia 2-3 tablespoons, or 2 Dulcolax tablets. Call if you continue to have problems. If you have been taking narcotics for pain, before, during or after your surgery, you may be constipated. Take a laxative if necessary. ? ? ?MEDICATION: ?You should resume your pre-surgery medications unless told not to. In addition you will often be given an antibiotic to prevent infection and likely several as needed medications for stent related discomfort. These should be taken as prescribed until the bottles are finished unless you are having an unusual reaction to one of the drugs. ? ?PROBLEMS YOU SHOULD REPORT TO [place]: ?Fevers over 100.5 Fahrenheit. ?Heavy bleeding, or clots ( See above notes about blood in urine ). ?Inability to urinate. ?Drug reactions ( hives, rash, nausea, vomiting, diarrhea ). ?Severe burning or pain with urination that is not improving. ? ? ?Post Anesthesia Home Care Instructions ? ?Activity: ?Get plenty of rest for the remainder of the day. A responsible individual must stay with you for 24 hours following the procedure.  ?For the next 24 hours, DO NOT: ?-Drive a car ?-Advertising copywriter ?-Drink alcoholic beverages ?-Take any medication unless instructed by your physician ?-Make any legal decisions or sign important papers. ? ?Meals: ?Start with liquid foods such as gelatin or soup. Progress to regular foods as tolerated. Avoid  greasy, spicy, heavy foods. If nausea and/or vomiting occur, drink only clear liquids until the nausea and/or vomiting subsides. Call your physician if vomiting continues. ? ?Special Instructions/Symptoms: ?Your throat may feel dry or sore from the anesthesia or the breathing tube placed in your throat during surgery. If this causes discomfort, gargle with warm salt water. The  discomfort should disappear within 24 hours. ? ?If you had a scopolamine patch placed behind your ear for the management of post- operative nausea and/or vomiting: ? ?1. The medication in the patch is effective for 72 hours, after which it should be removed.  Wrap patch in a tissue and discard in the trash. Wash hands thoroughly with soap and water. ?2. You may remove the patch earlier than 72 hours if you experience unpleasant side effects which may include dry mouth, dizziness or visual disturbances. ?3. Avoid touching the patch. Wash your hands with soap and water after contact with the patch. ?    ? ? ? ? ? ?

## 2024-01-11 NOTE — Anesthesia Postprocedure Evaluation (Signed)
 Anesthesia Post Note  Patient: Vanessa Bean  Procedure(s) Performed: CYSTOSCOPY/URETEROSCOPY/HOLMIUM LASER/STONE BASKET RETRIVAL/STENT EXCHANGE (Left: Ureter)     Patient location during evaluation: PACU Anesthesia Type: General Level of consciousness: oriented, patient cooperative and awake and alert Pain management: pain level controlled Vital Signs Assessment: post-procedure vital signs reviewed and stable Respiratory status: spontaneous breathing, nonlabored ventilation and respiratory function stable Cardiovascular status: blood pressure returned to baseline and stable Postop Assessment: no apparent nausea or vomiting and able to ambulate Anesthetic complications: no   No notable events documented.  Last Vitals:  Vitals:   01/11/24 0945 01/11/24 1000  BP:  137/81  Pulse:  88  Resp:  18  Temp: 36.6 C   SpO2:  96%    Last Pain:  Vitals:   01/11/24 1000  TempSrc:   PainSc: 4                  Adon Gehlhausen,E. Sandrina Heaton

## 2024-01-11 NOTE — Anesthesia Procedure Notes (Signed)
 Procedure Name: LMA Insertion Date/Time: 01/11/2024 7:54 AM  Performed by: Delores Duwaine SAUNDERS, CRNAPre-anesthesia Checklist: Patient identified, Emergency Drugs available, Suction available and Patient being monitored Patient Re-evaluated:Patient Re-evaluated prior to induction Oxygen Delivery Method: Circle System Utilized Preoxygenation: Pre-oxygenation with 100% oxygen Induction Type: IV induction Ventilation: Mask ventilation without difficulty LMA: LMA inserted LMA Size: 4.0 Number of attempts: 1 Airway Equipment and Method: Bite block Placement Confirmation: positive ETCO2 Tube secured with: Tape Dental Injury: Teeth and Oropharynx as per pre-operative assessment

## 2024-01-11 NOTE — Addendum Note (Signed)
 Addended by: WENDEE LYNWOOD HERO on: 01/11/2024 01:11 PM   Modules accepted: Orders

## 2024-01-11 NOTE — Op Note (Signed)
 Operative Note  Preoperative diagnosis:  1.  Left ureteral stone  Postoperative diagnosis: 1.  Same, impacted stone  Procedure(s): 1.  Left ureteroscopy with laser lithotripsy and basket extraction of stones 2. Left retrograde pyelogram 3. Left ureteral stent placement  Surgeon: Herlene Foot, MD  Assistants:  None  Anesthesia:  General  Complications:  None  EBL: Minimal  Specimens: 1. stones  Drains/Catheters: 1.  Left 6Fr x 24cm ureteral stent without string  Intraoperative findings:   Cystoscopy demonstrated unremarkable bladder Left Ureteroscopy demonstrated an impacted stone in the proximal ureter. This was fragmented and removed with a basket. Fragments in the kidney were dusted Successful stent placement.  Left Retrograde Pyelogram: contrast did not pass stone on initial retrograde. Later in case when repeated, there was left hydronephrosis   Description of procedure: After informed consent was obtained from the patient, the patient was identified and taken to the operating room and placed in the supine position.  General anesthesia was administered as well as perioperative IV antibiotics.  At the beginning of the case, a time-out was performed to properly identify the patient, the surgery to be performed, and the surgical site.  Sequential compression devices were applied to the lower extremities at the beginning of the case for DVT prophylaxis.  The patient was then placed in the dorsal lithotomy supine position, prepped and draped in sterile fashion.  We then passed the 21-French rigid cystoscope through the urethra and into the bladder under vision without any difficulty.  A systematic evaluation of the bladder revealed no evidence of any suspicious bladder lesions.  Ureteral orifices were in normal position.    The distal aspect of the ureteral stent was seen protruding from the leftureteral orifice.  We then used the alligator-tooth forceps and grasped the distal  end of the ureteral stent and brought it out the urethral meatus while watching the proximal coil straighten out nicely on fluoroscopy. Through the ureteral stent, we then passed a 0.038 glide wire up to the level of the renal pelvis.  The ureteral stent was then removed, leaving the glide wire up the left ureter.  The cystoscope was withdrawn, and a dual lumen catheter was inserted over the glide wire into the distal ureter. A gentle retrograde pyelogram was performed, see findings above. A 0.038 sensor wire was then passed up to the level of the renal pelvis and secured to the drape as a safety wire. The dual lumen was removed.  An 11/13Fr ureteral access sheath was carefully advanced up the ureter to the level of the UPJ over this wire under fluoroscopic guidance. The flexible ureteroscope was advanced into the proximal ureter. The calculus was identified and was notably impacted. Using the 242 micron holmium laser fiber, the stone was fragmented. An n-gage basket was used to removed the fragments.. These were sent for chemical analysis. With the ureteroscope in the kidney, a gentle pyelogram was performed to delineate the calyceal system and we evaluated the calyces systematically. We encountered a few small fragments that had been blown into the kidney; these were dusted. The rest of the stone fragments were very tiny and these were  irrigated away gently. The calyces were re-inspected and there were no significant stone fragment residual.   We then withdrew the ureteroscope back down the ureter along with the access sheath, noting no evidence of any stones along the course of the ureter.  Prior to removing the ureteroscope, we did pass the Glidewire back up to the ureter to  the renal pelvis.     Once the ureteroscope was removed, the Glidewire was backloaded through the rigid cystoscope, which was then advanced down the urethra and into the bladder. We then used the Glidewire under direct vision through  the rigid cystoscope and under fluoroscopic guidance and passed up a 6-French, 24 cm double-pigtail ureteral stent up ureter, making sure that the proximal and distal ends coiled within the kidney and bladder respectively.  Given the impacted stone and associated ureteral inflammation, I did not leave the stone on a string  The cystoscope was then advanced back into the bladder under vision.  We were able to see the distal stent coiling nicely within the bladder.  The bladder was then emptied with irrigation solution.  The cystoscope was then removed.    The patient tolerated the procedure well and there was no complication. Patient was awoken from anesthesia and taken to the recovery room in stable condition. I was present and scrubbed for the entirety of the case.  Plan:  Patient will be discharged home and will f/u to remove stent in 3-4 weeks   G. Herlene Foot MD [facility]  Pager: [phone]

## 2024-01-11 NOTE — OR Nursing (Signed)
 Left urinary stent removed 0805

## 2024-01-11 NOTE — Transfer of Care (Signed)
 Immediate Anesthesia Transfer of Care Note  Patient: Vanessa Bean  Procedure(s) Performed: CYSTOSCOPY/URETEROSCOPY/HOLMIUM LASER/STONE BASKET RETRIVAL/STENT EXCHANGE (Left: Ureter)  Patient Location: PACU  Anesthesia Type:General  Level of Consciousness: drowsy and patient cooperative  Airway & Oxygen Therapy: Patient Spontanous Breathing  Post-op Assessment: Report given to RN  Post vital signs: Reviewed and stable  Last Vitals:  Vitals Value Taken Time  BP 138/86 01/11/24 09:33  Temp    Pulse 91 01/11/24 09:37  Resp 19 01/11/24 09:37  SpO2 93 % 01/11/24 09:37  Vitals shown include unfiled device data.  Last Pain:  Vitals:   01/11/24 0606  TempSrc: Oral  PainSc: 6       Patients Stated Pain Goal: 5 (01/11/24 0606)  Complications: No notable events documented.

## 2024-01-12 ENCOUNTER — Encounter (HOSPITAL_COMMUNITY): Payer: Self-pay | Admitting: Urology

## 2024-01-13 ENCOUNTER — Telehealth: Payer: Self-pay

## 2024-01-14 ENCOUNTER — Ambulatory Visit: Payer: Self-pay | Admitting: Nurse Practitioner

## 2024-01-14 NOTE — Transitions of Care (Post Inpatient/ED Visit) (Deleted)
   01/14/2024  Name: Vanessa Bean MRN: [id number] DOB: Feb 18, 1976  {[id number]}

## 2024-01-21 ENCOUNTER — Ambulatory Visit: Payer: Self-pay | Admitting: Nurse Practitioner

## 2024-01-30 ENCOUNTER — Other Ambulatory Visit: Payer: Self-pay | Admitting: Nurse Practitioner

## 2024-01-30 DIAGNOSIS — K219 Gastro-esophageal reflux disease without esophagitis: Secondary | ICD-10-CM

## 2024-01-31 NOTE — Telephone Encounter (Signed)
 Can we call and see how her stomach (reflux) is doing on the protonix  40mg ?

## 2024-02-01 ENCOUNTER — Other Ambulatory Visit: Payer: Self-pay

## 2024-02-01 ENCOUNTER — Emergency Department (HOSPITAL_COMMUNITY): Payer: Self-pay

## 2024-02-01 ENCOUNTER — Inpatient Hospital Stay (HOSPITAL_COMMUNITY)
Admission: EM | Admit: 2024-02-01 | Discharge: 2024-02-03 | DRG: 698 | Disposition: A | Discharge status: Home / Self Care | Payer: Self-pay | Attending: Internal Medicine | Admitting: Internal Medicine

## 2024-02-01 ENCOUNTER — Encounter (HOSPITAL_COMMUNITY): Payer: Self-pay

## 2024-02-01 DIAGNOSIS — Z6841 Body Mass Index (BMI) 40.0 and over, adult: Secondary | ICD-10-CM

## 2024-02-01 DIAGNOSIS — K219 Gastro-esophageal reflux disease without esophagitis: Secondary | ICD-10-CM | POA: Diagnosis present

## 2024-02-01 DIAGNOSIS — E871 Hypo-osmolality and hyponatremia: Secondary | ICD-10-CM | POA: Diagnosis present

## 2024-02-01 DIAGNOSIS — Z79899 Other long term (current) drug therapy: Secondary | ICD-10-CM

## 2024-02-01 DIAGNOSIS — I10 Essential (primary) hypertension: Secondary | ICD-10-CM | POA: Diagnosis present

## 2024-02-01 DIAGNOSIS — T83592A Infection and inflammatory reaction due to indwelling ureteral stent, initial encounter: Principal | ICD-10-CM | POA: Diagnosis present

## 2024-02-01 DIAGNOSIS — N10 Acute tubulo-interstitial nephritis: Secondary | ICD-10-CM | POA: Diagnosis present

## 2024-02-01 DIAGNOSIS — E876 Hypokalemia: Secondary | ICD-10-CM | POA: Diagnosis present

## 2024-02-01 DIAGNOSIS — A419 Sepsis, unspecified organism: Principal | ICD-10-CM | POA: Diagnosis present

## 2024-02-01 DIAGNOSIS — Z8249 Family history of ischemic heart disease and other diseases of the circulatory system: Secondary | ICD-10-CM

## 2024-02-01 DIAGNOSIS — Z794 Long term (current) use of insulin: Secondary | ICD-10-CM

## 2024-02-01 DIAGNOSIS — N200 Calculus of kidney: Secondary | ICD-10-CM | POA: Diagnosis present

## 2024-02-01 DIAGNOSIS — Z87891 Personal history of nicotine dependence: Secondary | ICD-10-CM

## 2024-02-01 DIAGNOSIS — Y732 Prosthetic and other implants, materials and accessory gastroenterology and urology devices associated with adverse incidents: Secondary | ICD-10-CM | POA: Diagnosis present

## 2024-02-01 DIAGNOSIS — N390 Urinary tract infection, site not specified: Secondary | ICD-10-CM

## 2024-02-01 DIAGNOSIS — A4181 Sepsis due to Enterococcus: Secondary | ICD-10-CM | POA: Diagnosis present

## 2024-02-01 DIAGNOSIS — Z833 Family history of diabetes mellitus: Secondary | ICD-10-CM

## 2024-02-01 DIAGNOSIS — E119 Type 2 diabetes mellitus without complications: Secondary | ICD-10-CM | POA: Diagnosis present

## 2024-02-01 DIAGNOSIS — E66813 Obesity, class 3: Secondary | ICD-10-CM | POA: Diagnosis present

## 2024-02-01 DIAGNOSIS — F419 Anxiety disorder, unspecified: Secondary | ICD-10-CM | POA: Diagnosis present

## 2024-02-01 LAB — I-STAT CG4 LACTIC ACID, ED: Lactic Acid, Venous: 2.2 mmol/L (ref 0.5–1.9)

## 2024-02-01 LAB — CBC WITH DIFFERENTIAL/PLATELET
Abs Immature Granulocytes: 0.09 K/uL — ABNORMAL HIGH (ref 0.00–0.07)
Basophils Absolute: 0.1 K/uL (ref 0.0–0.1)
Basophils Relative: 0 %
Eosinophils Absolute: 0 K/uL (ref 0.0–0.5)
Eosinophils Relative: 0 %
HCT: 36.3 % (ref 36.0–46.0)
Hemoglobin: 12.3 g/dL (ref 12.0–15.0)
Immature Granulocytes: 1 %
Lymphocytes Relative: 10 %
Lymphs Abs: 1.6 K/uL (ref 0.7–4.0)
MCH: 32.2 pg (ref 26.0–34.0)
MCHC: 33.9 g/dL (ref 30.0–36.0)
MCV: 95 fL (ref 80.0–100.0)
Monocytes Absolute: 1.4 K/uL — ABNORMAL HIGH (ref 0.1–1.0)
Monocytes Relative: 9 %
Neutro Abs: 12.5 K/uL — ABNORMAL HIGH (ref 1.7–7.7)
Neutrophils Relative %: 80 %
Platelets: 305 K/uL (ref 150–400)
RBC: 3.82 MIL/uL — ABNORMAL LOW (ref 3.87–5.11)
RDW: 13.7 % (ref 11.5–15.5)
WBC: 15.7 K/uL — ABNORMAL HIGH (ref 4.0–10.5)
nRBC: 0 % (ref 0.0–0.2)

## 2024-02-01 LAB — URINALYSIS, W/ REFLEX TO CULTURE (INFECTION SUSPECTED)
Bilirubin Urine: NEGATIVE
Glucose, UA: NEGATIVE mg/dL
Ketones, ur: NEGATIVE mg/dL
Nitrite: NEGATIVE
Protein, ur: 100 mg/dL — AB
RBC / HPF: >50 RBC/hpf (ref 0–5)
Specific Gravity, Urine: 1.017 (ref 1.005–1.030)
WBC, UA: >50 WBC/hpf (ref 0–5)
pH: 5 (ref 5.0–8.0)

## 2024-02-01 LAB — HCG, SERUM, QUALITATIVE: Preg, Serum: NEGATIVE

## 2024-02-01 LAB — COMPREHENSIVE METABOLIC PANEL WITH GFR
ALT: 29 U/L (ref 0–44)
AST: 24 U/L (ref 15–41)
Albumin: 3.5 g/dL (ref 3.5–5.0)
Alkaline Phosphatase: 91 U/L (ref 38–126)
Anion gap: 12 (ref 5–15)
BUN: 13 mg/dL (ref 6–20)
CO2: 21 mmol/L — ABNORMAL LOW (ref 22–32)
Calcium: 9.7 mg/dL (ref 8.9–10.3)
Chloride: 99 mmol/L (ref 98–111)
Creatinine, Ser: 0.74 mg/dL (ref 0.44–1.00)
GFR, Estimated: >60 mL/min (ref >60)
Glucose, Bld: 190 mg/dL — ABNORMAL HIGH (ref 70–99)
Potassium: 3.4 mmol/L — ABNORMAL LOW (ref 3.5–5.1)
Sodium: 132 mmol/L — ABNORMAL LOW (ref 135–145)
Total Bilirubin: 1.1 mg/dL (ref 0.0–1.2)
Total Protein: 7.5 g/dL (ref 6.5–8.1)

## 2024-02-01 LAB — GLUCOSE, CAPILLARY: Glucose-Capillary: 241 mg/dL — ABNORMAL HIGH (ref 70–99)

## 2024-02-01 MED ORDER — ACETAMINOPHEN 650 MG RE SUPP: 650.0000 mg | Freq: Four times a day (QID) | RECTAL | Status: DC | PRN

## 2024-02-01 MED ORDER — ONDANSETRON HCL 4 MG PO TABS: 4.0000 mg | ORAL_TABLET | Freq: Four times a day (QID) | ORAL | Status: DC | PRN

## 2024-02-01 MED ORDER — SODIUM CHLORIDE 0.9 % IV SOLN
2.0000 g | INTRAVENOUS | Status: DC
  Administered 2024-02-02: 2 g via INTRAVENOUS
  Filled 2024-02-01: qty 20

## 2024-02-01 MED ORDER — IRBESARTAN 150 MG PO TABS
150.0000 mg | ORAL_TABLET | Freq: Every day | ORAL | Status: DC
  Filled 2024-02-01 (×2): qty 1

## 2024-02-01 MED ORDER — SODIUM CHLORIDE 0.9 % IV SOLN: INTRAVENOUS | Status: DC

## 2024-02-01 MED ORDER — KETOROLAC TROMETHAMINE 30 MG/ML IJ SOLN
30.0000 mg | Freq: Once | INTRAMUSCULAR | Status: AC
  Administered 2024-02-01: 30 mg via INTRAVENOUS
  Filled 2024-02-01: qty 1

## 2024-02-01 MED ORDER — HYDRALAZINE HCL 20 MG/ML IJ SOLN: 10.0000 mg | Freq: Four times a day (QID) | INTRAMUSCULAR | Status: DC | PRN

## 2024-02-01 MED ORDER — ENOXAPARIN SODIUM 40 MG/0.4ML IJ SOSY
40.0000 mg | PREFILLED_SYRINGE | INTRAMUSCULAR | Status: DC
  Administered 2024-02-01 – 2024-02-02 (×2): 40 mg via SUBCUTANEOUS
  Filled 2024-02-01 (×2): qty 0.4

## 2024-02-01 MED ORDER — LACTATED RINGERS IV SOLN: INTRAVENOUS | Status: DC

## 2024-02-01 MED ORDER — MECLIZINE HCL 25 MG PO TABS: 25.0000 mg | ORAL_TABLET | Freq: Three times a day (TID) | ORAL | Status: DC | PRN

## 2024-02-01 MED ORDER — BUSPIRONE HCL 5 MG PO TABS
15.0000 mg | ORAL_TABLET | Freq: Two times a day (BID) | ORAL | Status: DC
  Administered 2024-02-01 – 2024-02-03 (×4): 15 mg via ORAL
  Filled 2024-02-01 (×4): qty 1

## 2024-02-01 MED ORDER — ONDANSETRON HCL 4 MG/2ML IJ SOLN
4.0000 mg | Freq: Once | INTRAMUSCULAR | Status: AC
  Administered 2024-02-01: 4 mg via INTRAVENOUS
  Filled 2024-02-01: qty 2

## 2024-02-01 MED ORDER — HYDROMORPHONE HCL 1 MG/ML IJ SOLN
0.5000 mg | INTRAMUSCULAR | Status: DC | PRN
  Administered 2024-02-01: 0.5 mg via INTRAVENOUS
  Filled 2024-02-01: qty 0.5

## 2024-02-01 MED ORDER — HYDROMORPHONE HCL 1 MG/ML IJ SOLN
1.0000 mg | Freq: Once | INTRAMUSCULAR | Status: AC
  Administered 2024-02-01: 1 mg via INTRAVENOUS
  Filled 2024-02-01: qty 1

## 2024-02-01 MED ORDER — ONDANSETRON HCL 4 MG/2ML IJ SOLN
4.0000 mg | Freq: Four times a day (QID) | INTRAMUSCULAR | Status: DC | PRN
  Administered 2024-02-02 (×2): 4 mg via INTRAVENOUS
  Filled 2024-02-01 (×2): qty 2

## 2024-02-01 MED ORDER — SODIUM CHLORIDE 0.9% FLUSH
3.0000 mL | Freq: Two times a day (BID) | INTRAVENOUS | Status: DC
  Administered 2024-02-01 – 2024-02-03 (×2): 3 mL via INTRAVENOUS

## 2024-02-01 MED ORDER — TAMSULOSIN HCL 0.4 MG PO CAPS
0.4000 mg | ORAL_CAPSULE | Freq: Every day | ORAL | Status: DC
  Administered 2024-02-01 – 2024-02-02 (×2): 0.4 mg via ORAL
  Filled 2024-02-01 (×2): qty 1

## 2024-02-01 MED ORDER — VALACYCLOVIR HCL 500 MG PO TABS
2000.0000 mg | ORAL_TABLET | Freq: Two times a day (BID) | ORAL | Status: DC | PRN
  Administered 2024-02-02 – 2024-02-03 (×2): 2000 mg via ORAL
  Filled 2024-02-01 (×3): qty 4

## 2024-02-01 MED ORDER — PANTOPRAZOLE SODIUM 40 MG PO TBEC
40.0000 mg | DELAYED_RELEASE_TABLET | Freq: Every day | ORAL | Status: DC
Start: 2024-02-01 — End: 2024-02-03
  Administered 2024-02-01: 40 mg via ORAL
  Filled 2024-02-01 (×3): qty 1

## 2024-02-01 MED ORDER — SODIUM CHLORIDE 0.9% FLUSH: 3.0000 mL | INTRAVENOUS | Status: DC | PRN

## 2024-02-01 MED ORDER — POLYETHYLENE GLYCOL 3350 17 G PO PACK
17.0000 g | PACK | Freq: Every day | ORAL | Status: DC | PRN
Start: 2024-02-01 — End: 2024-02-03

## 2024-02-01 MED ORDER — SODIUM CHLORIDE 0.9 % IV SOLN: 250.0000 mL | INTRAVENOUS | Status: AC | PRN

## 2024-02-01 MED ORDER — DULOXETINE HCL 60 MG PO CPEP
60.0000 mg | ORAL_CAPSULE | Freq: Every day | ORAL | Status: DC
  Administered 2024-02-02 – 2024-02-03 (×2): 60 mg via ORAL
  Filled 2024-02-01 (×2): qty 1

## 2024-02-01 MED ORDER — LACTATED RINGERS IV BOLUS (SEPSIS)
1000.0000 mL | Freq: Once | INTRAVENOUS | Status: AC
  Administered 2024-02-01: 1000 mL via INTRAVENOUS

## 2024-02-01 MED ORDER — TRAMADOL HCL 50 MG PO TABS: 50.0000 mg | ORAL_TABLET | Freq: Four times a day (QID) | ORAL | Status: DC | PRN

## 2024-02-01 MED ORDER — ACETAMINOPHEN 325 MG PO TABS
650.0000 mg | ORAL_TABLET | Freq: Four times a day (QID) | ORAL | Status: DC | PRN
  Administered 2024-02-01 – 2024-02-03 (×4): 650 mg via ORAL
  Filled 2024-02-01 (×4): qty 2

## 2024-02-01 MED ORDER — POTASSIUM CHLORIDE CRYS ER 20 MEQ PO TBCR
40.0000 meq | EXTENDED_RELEASE_TABLET | Freq: Once | ORAL | Status: AC
  Administered 2024-02-01: 40 meq via ORAL
  Filled 2024-02-01: qty 2

## 2024-02-01 MED ORDER — METOPROLOL SUCCINATE ER 50 MG PO TB24
75.0000 mg | ORAL_TABLET | Freq: Every day | ORAL | Status: DC
  Filled 2024-02-01: qty 1

## 2024-02-01 MED ORDER — SODIUM CHLORIDE 0.9 % IV SOLN
2.0000 g | Freq: Once | INTRAVENOUS | Status: AC
  Administered 2024-02-01: 2 g via INTRAVENOUS
  Filled 2024-02-01: qty 20

## 2024-02-01 MED ORDER — INSULIN ASPART 100 UNIT/ML IJ SOLN
0.0000 [IU] | Freq: Three times a day (TID) | INTRAMUSCULAR | Status: DC
  Administered 2024-02-02: 1 [IU] via SUBCUTANEOUS

## 2024-02-01 MED ORDER — SODIUM CHLORIDE 0.9 % IV SOLN: 1.0000 g | INTRAVENOUS | Status: DC

## 2024-02-01 NOTE — H&P (Addendum)
 [facility] History and Physical  Vanessa Bean FMW:[id number] DOB: Oct 24, 1975 DOA: 02/01/2024  Referring physician: ED  PCP: Wendee Lynwood HERO, NP   Patient is coming from: Home  Chief Complaint: Flank pain  HPI:  Patient is a 48 years old female with past medical history of diabetes mellitus type 2, history of kidney stones, hypertension presented to the hospital with fever with nausea and bilateral flank pain for few days starting Sunday.  Patient complained of increased urgency, mild dysuria and fever at home.  She also complained of nausea but no vomiting headache.  Patient was unable to tell if she had hematuria due to current menstruation.  Of note, patient had left ureteroscopy for impacted stone on 01/11/24 with indwelling left ureteral stent with plans for removal later this week by urology.  She was doing well until Sunday when symptoms started.  Patient denies diarrhea or vomiting.  Denies any syncope but had some lightheadedness and dizziness.  She however has history of vertigo.  Denies any chest pain, shortness of breath or dyspnea.  In the ED, patient was febrile with a temperature 100.5 F with tachycardia.  She had bilateral CV angle tenderness more on the right side.  CBC showed leukocytosis at 15.7.  Lactate was elevated at 2.2.  Urinalysis was abnormal.  Imaging showed left stent in place without any hydronephrosis with the right staghorn calculus at 2.4 cm without any perinephric stranding.  In the ED, patient received 2 L of IV fluid bolus, IV Rocephin , cultures were sent and patient was considered for admission to the hospital for further evaluation and treatment.   Assessment and Plan Principal Problem:   Acute pyelonephritis Active Problems:   Primary hypertension   Class 3 severe obesity due to excess calories with serious comorbidity and body mass index (BMI) of 50.0 to 59.9 in adult   Sepsis due to urinary tract infection (HCC)   Kidney stone  Sepsis  secondary to acute pyelonephritis status post left ureteroscopy on 7/15 for impacted ureteral stone with ureteral stent in place.  Patient had a signs of sepsis including fever, tachycardia, leukocytosis with elevated lactate at 2.2 with abnormal urinalysis likely acute pyelonephritis.  History of right known staghorn calculus.  Patient has a leukocytosis and signs of infection likely secondary to acute pyelonephritis.  Urology has been consulted and at this time and plan for conservative treatment with antibiotic and no indication for stent removal.  Will obtain urine cultures, blood cultures.  Diabetes mellitus type 2. Latest hemoglobin A1c of 6.7 on 7/ 3/25.  Will not repeat.  Put on diabetic diet.  Not on medications at home. Add SSI  Essential hypertension. Continue metoprolol  and Benicar .  Hold HCTZ triamterene   Blood pressure seems to be stable at this time.  History of anxiety on buspirone  and duloxetine .  Will continue.  Mild hypokalemia.  Hold HCTZ.  Replenish orally..  Check levels in AM.  GERD on Protonix .  Will continue.  Mild hyponatremia.  Will continue to monitor.  Continue with normal saline.  Check BMP in AM.  Class III obesity. Body mass index is 53.04 kg/m.  Patient would benefit from ongoing weight loss as outpatient.   DVT Prophylaxis: SCD  Review of Systems:  All systems were reviewed and were negative unless otherwise mentioned in the HPI   Past Medical History:  Diagnosis Date   Diabetes mellitus without complication (HCC)    History of bronchitis    History of kidney stones  Hypertension    Past Surgical History:  Procedure Laterality Date   ANKLE FRACTURE SURGERY Right    CESAREAN SECTION     CHOLECYSTECTOMY     CYSTOSCOPY W/ RETROGRADES Right 11/07/2019   Procedure: CYSTOSCOPY RIGHT URETERAL STENT PLACEMENT WITH RETROGRADE PYELOGRAM;  Surgeon: Watt Rush, MD;  Location: [facility];  Service: Urology;  Laterality: Right;   CYSTOSCOPY W/ URETERAL STENT  PLACEMENT Left 12/27/2023   Procedure: CYSTOSCOPY, WITH RETROGRADE PYELOGRAM AND URETERAL STENT INSERTION;  Surgeon: Lovie Arlyss CROME, MD;  Location: [facility] OR;  Service: Urology;  Laterality: Left;   CYSTOSCOPY/URETEROSCOPY/HOLMIUM LASER/STENT PLACEMENT Right 11/23/2019   Procedure: RIGHT URETEROSCOPY//STENT EXCHANGE;  Surgeon: Watt Rush, MD;  Location: [facility];  Service: Urology;  Laterality: Right;   CYSTOSCOPY/URETEROSCOPY/HOLMIUM LASER/STENT PLACEMENT Left 01/11/2024   Procedure: CYSTOSCOPY/URETEROSCOPY/HOLMIUM LASER/STONE BASKET RETRIVAL/STENT EXCHANGE;  Surgeon: Lovie Arlyss CROME, MD;  Location: [facility];  Service: Urology;  Laterality: Left;   DILATION AND CURETTAGE OF UTERUS      Social History:  reports that she quit smoking about 10 years ago. Her smoking use included cigarettes. She has never used smokeless tobacco. She reports that she does not currently use alcohol. She reports that she does not use drugs.  No Known Allergies  Family History  Problem Relation Age of Onset   Heart disease Mother        CHF   Hypertension Mother    Diabetes Mother    Alcohol abuse Father    Schizophrenia Father    Bipolar disorder Father    Diabetes Maternal Grandmother    Cancer Maternal Grandmother        Breast cancer     Prior to Admission medications   Medication Sig Start Date End Date Taking? Authorizing Provider  acetaminophen  (TYLENOL ) 500 MG tablet Take 1,000 mg by mouth every 6 (six) hours as needed for mild pain (pain score 1-3), moderate pain (pain score 4-6), fever or headache.   Yes [provider]  busPIRone  (BUSPAR ) 15 MG tablet Take 1 tablet (15 mg total) by mouth 2 (two) times daily. 01/11/24  Yes Wendee Lynwood HERO, NP  celecoxib  (CELEBREX ) 200 MG capsule Take 1 capsule (200 mg total) by mouth 2 (two) times daily. 01/11/24  Yes Lovie Arlyss CROME, MD  DULoxetine  (CYMBALTA ) 60 MG capsule Take 1 capsule by mouth once daily 12/20/23  Yes Cable, Lynwood HERO, NP  hyoscyamine  (LEVSIN AMIEL)  0.125 MG SL tablet Place 1 tablet (0.125 mg total) under the tongue every 4 (four) hours as needed. Patient taking differently: Place 0.125 mg under the tongue every 4 (four) hours as needed for cramping. 01/11/24  Yes Lovie Arlyss CROME, MD  meclizine  (ANTIVERT ) 25 MG tablet Take 1 tablet (25 mg total) by mouth 3 (three) times daily as needed for dizziness. 10/16/23  Yes Long, Joshua G, MD  metoprolol  succinate (TOPROL -XL) 50 MG 24 hr tablet Take 1.5 tablets (75 mg total) by mouth daily. Take with or immediately following a meal. 11/25/23  Yes Wendee Lynwood HERO, NP  olmesartan  (BENICAR ) 20 MG tablet Take 1 tablet (20 mg total) by mouth daily. 09/09/23  Yes Wendee Lynwood HERO, NP  pantoprazole  (PROTONIX ) 40 MG tablet Take 1 tablet (40 mg total) by mouth daily. Patient taking differently: Take 40 mg by mouth daily as needed (for acid reflux). 01/06/24  Yes Wendee Lynwood HERO, NP  TAMSULOSIN  HCL PO Take 0.4 mg by mouth at bedtime.   Yes [provider]  traMADol  (ULTRAM ) 50 MG tablet Take 1  tablet (50 mg total) by mouth every 6 (six) hours as needed. Patient taking differently: Take 50 mg by mouth every 6 (six) hours as needed for moderate pain (pain score 4-6). 01/11/24  Yes Lovie Arlyss CROME, MD  triamterene -hydrochlorothiazide  (MAXZIDE -25) 37.5-25 MG tablet Take 1 tablet by mouth once daily 06/14/23  Yes Wendee Lynwood HERO, NP  valACYclovir  (VALTREX ) 1000 MG tablet Take 2 tablets (2,000 mg total) by mouth 2 (two) times daily. Patient taking differently: Take 2,000 mg by mouth 2 (two) times daily as needed (for outbreaks). 06/10/23  Yes Wendee Lynwood HERO, NP  cephALEXin  (KEFLEX ) 500 MG capsule Take 1 capsule (500 mg total) by mouth 2 (two) times daily. Patient not taking: Reported on 02/01/2024 01/11/24   Lovie Arlyss CROME, MD    Physical Exam:  Vitals:   02/01/24 1310 02/01/24 1501 02/01/24 1702  BP: 111/77 106/69 118/70  Pulse: (!) 111 95 93  Resp: 20 16 16   Temp: (!) 100.5 F (38.1 C)    TempSrc: Oral     SpO2: 100% 95% 90%   Wt Readings from Last 3 Encounters:  01/11/24 94.3 kg  01/06/24 (!) 140 kg  12/27/23 (!) (P) 143.3 kg   There is no height or weight on file to calculate BMI.  General: Obese built, not in obvious distress HENT: Normocephalic, No scleral pallor or icterus noted. Oral mucosa is moist.  Chest:  Clear breath sounds.  No crackles or wheezes.  CVS: S1 &S2 heard. No murmur.  Regular rate and rhythm. Abdomen: Soft, bilateral costovertebral angle tenderness noted.  Bowel sounds are heard. No abdominal mass palpated Extremities: No cyanosis, clubbing or edema.  Peripheral pulses are palpable. Psych: Alert, awake and oriented, normal mood CNS:  No cranial nerve deficits.  Power equal in all extremities.   Skin: Warm and dry.  No rashes noted.  Labs on Admission:   CBC: Recent Labs  Lab 02/01/24 1321  WBC 15.7*  NEUTROABS 12.5*  HGB 12.3  HCT 36.3  MCV 95.0  PLT 305    Basic Metabolic Panel: Recent Labs  Lab 02/01/24 1321  NA 132*  K 3.4*  CL 99  CO2 21*  GLUCOSE 190*  BUN 13  CREATININE 0.74  CALCIUM 9.7    Liver Function Tests: Recent Labs  Lab 02/01/24 1321  AST 24  ALT 29  ALKPHOS 91  BILITOT 1.1  PROT 7.5  ALBUMIN 3.5   No results for input(s): LIPASE, AMYLASE in the last 168 hours. No results for input(s): AMMONIA in the last 168 hours.  Cardiac Enzymes: No results for input(s): CKTOTAL, CKMB, CKMBINDEX, TROPONINI in the last 168 hours.  BNP (last 3 results) No results for input(s): BNP in the last 8760 hours.  ProBNP (last 3 results) No results for input(s): PROBNP in the last 8760 hours.  CBG: No results for input(s): GLUCAP in the last 168 hours.  Lipase     Component Value Date/Time   LIPASE 29 12/27/2023 0320     Urinalysis    Component Value Date/Time   COLORURINE AMBER (A) 02/01/2024 1321   APPEARANCEUR HAZY (A) 02/01/2024 1321   LABSPEC 1.017 02/01/2024 1321   PHURINE 5.0 02/01/2024 1321    GLUCOSEU NEGATIVE 02/01/2024 1321   HGBUR LARGE (A) 02/01/2024 1321   BILIRUBINUR NEGATIVE 02/01/2024 1321   KETONESUR NEGATIVE 02/01/2024 1321   PROTEINUR 100 (A) 02/01/2024 1321   UROBILINOGEN 0.2 11/20/2009 0850   NITRITE NEGATIVE 02/01/2024 1321   LEUKOCYTESUR MODERATE (A) 02/01/2024 1321  Drugs of Abuse     Component Value Date/Time   LABOPIA NONE DETECTED 12/27/2023 0403   COCAINSCRNUR NONE DETECTED 12/27/2023 0403   LABBENZ NONE DETECTED 12/27/2023 0403   AMPHETMU NONE DETECTED 12/27/2023 0403   THCU NONE DETECTED 12/27/2023 0403   LABBARB NONE DETECTED 12/27/2023 0403      Radiological Exams on Admission: CT Renal Stone Study Result Date: 02/01/2024 CLINICAL DATA:  Provided history: Abdominal/flank pain, stone suspected EXAM: CT ABDOMEN AND PELVIS WITHOUT CONTRAST TECHNIQUE: Multidetector CT imaging of the abdomen and pelvis was performed following the standard protocol without IV contrast. RADIATION DOSE REDUCTION: This exam was performed according to the departmental dose-optimization program which includes automated exposure control, adjustment of the mA and/or kV according to patient size and/or use of iterative reconstruction technique. COMPARISON:  Most recent CT 12/27/2023 FINDINGS: Lower chest: Subsegmental atelectasis or scarring within the lingula and both lower lobes. No pleural effusion. Hepatobiliary: The liver is enlarged spanning 21.9 cm cranial caudal with diffuse steatosis. Subtle capsular nodularity of the left hepatic lobe. No evidence of focal hepatic lesion on this unenhanced exam. Clips in the gallbladder fossa postcholecystectomy. No biliary dilatation. Pancreas: No ductal dilatation or inflammation. Spleen: Normal in size without focal abnormality. Adrenals/Urinary Tract: No adrenal nodule. Left nephroureteral stent in place, pigtails appropriately positioned. There is no hydronephrosis or renal collecting system dilatation. Mild left perinephric stranding  persists. Punctate nonobstructing stone in the lower left kidney. No stone along the course of the ureteral stent, there is a left pelvic phlebolith. Large stone in the upper right kidney measuring 2.4 cm, without significant interval change. There is no right hydronephrosis or perinephric inflammation. The right ureter is decompressed. Urinary bladder is minimally distended. No definite perivesicular inflammation. Stomach/Bowel: Unremarkable appearance of the stomach. No bowel obstruction or inflammation. Small to moderate volume of formed stool in the colon. Normal appendix. Vascular/Lymphatic: Normal caliber abdominal aorta. Minimal aortic atherosclerosis. Scattered small retroperitoneal lymph nodes, not enlarged by size criteria. No suspicious lymphadenopathy. Reproductive: Retroverted uterus.  No adnexal mass. Other: No free air or free fluid. Minimal fat containing umbilical hernia. Musculoskeletal: There are no acute or suspicious osseous abnormalities. Lower lumbar facet hypertrophy again seen. IMPRESSION: 1. Left nephroureteral stent in place. No hydronephrosis or renal collecting system dilatation. Mild left perinephric stranding persists. 2. Punctate nonobstructing stone in the lower left kidney. 3. Large nonobstructing stone in the upper right kidney measuring 2.4 cm, without significant interval change. 4. Hepatomegaly and hepatic steatosis. Subtle capsular nodularity of the left hepatic lobe. Recommend correlation with cirrhosis risk factors. Electronically Signed   By: Andrea Gasman M.D.   On: 02/01/2024 16:09   DG Chest 2 View Result Date: 02/01/2024 CLINICAL DATA:  Kidney stone. EXAM: CHEST - 2 VIEW COMPARISON:  Chest radiograph dated 12/27/2023. FINDINGS: The heart size and mediastinal contours are within normal limits. Both lungs are clear. The visualized skeletal structures are unremarkable. IMPRESSION: No active cardiopulmonary disease. Electronically Signed   By: Vanetta Chou M.D.    On: 02/01/2024 14:33    EKG: none   Consultant: Urology  Code Status: Full code  Microbiology urine cultures, blood cultures  Antibiotics: Rocephin  IV  Family Communication:  Patients' condition and plan of care including tests being ordered have been discussed with the patient  who indicate understanding and agree with the plan.   Status is: Inpatient  Severity of Illness: The appropriate patient status for this patient is INPATIENT. Inpatient status is judged to be reasonable and necessary  in order to provide the required intensity of service to ensure the patient's safety. The patient's presenting symptoms, physical exam findings, and initial radiographic and laboratory data in the context of their chronic comorbidities is felt to place them at high risk for further clinical deterioration. Furthermore, it is not anticipated that the patient will be medically stable for discharge from the hospital within 2 midnights of admission.   * I certify that at the point of admission it is my clinical judgment that the patient will require inpatient hospital care spanning beyond 2 midnights from the point of admission due to high intensity of service, high risk for further deterioration and high frequency of surveillance required.*  Signed, Vernal Alstrom, MD [facility] 02/01/2024

## 2024-02-01 NOTE — ED Triage Notes (Signed)
 Pt presents with c/o bilateral flank pain. Pt reports she has recently been hospitalized for kidney infections/stones. Pt is febrile, reports recently been treated for sepsis.

## 2024-02-01 NOTE — ED Provider Notes (Signed)
 48 yo female w/ hx of kidney stones, lithotripsy, here with flank pain, fever  Concern for infection - WBC elevated, UA with leuks, neg nitrites IV rocephin  given, toradol , IV fluids, lactate 2.2  Pending CT renal study and likely discussion with urology for suspected urinary source  Physical Exam  BP 118/65 (BP Location: Left Arm)   Pulse (!) 106   Temp (!) 100.8 F (38.2 C) (Axillary)   Resp 16   Ht 5' 4 (1.626 m)   Wt (!) 140.2 kg   LMP 02/01/2024   SpO2 100%   BMI 53.04 kg/m   Physical Exam  Procedures  Procedures  ED Course / MDM   Clinical Course as of 02/01/24 1845  Tue Feb 01, 2024  1647 Spoke to Dr Lillette from urology who reports patient can be admitted for IV antibiotics - no emergent indication for stent removal at this time; they will consult on patient in hospital [MT]    Clinical Course User Index [MT] Landy Dunnavant, Donnice PARAS, MD   Medical Decision Making Amount and/or Complexity of Data Reviewed Labs: ordered. Radiology: ordered.  Risk Prescription drug management. Decision regarding hospitalization.   Pt needing another round pain medicine at admission time to hospitalist.  CT reviewed - no persistent obstructing stone, but stranding left kidney (chronic), stent remains in place  Urology consulted - see ED course     Cottie Donnice PARAS, MD 02/01/24 [phone]

## 2024-02-01 NOTE — Consult Note (Signed)
 Urology Consult Note   Requesting Attending Physician:  Cottie Donnice PARAS, MD Service Providing Consult: Urology  Consulting Attending: Dr. Lovie   Reason for Consult:  UTI in setting of known nephrolithiasis  HPI: Vanessa Bean is seen in consultation for reasons noted above at the request of Trifan, Donnice PARAS, MD for evaluation of UTI in setting of known nephrolithiasis.  This is a 48 y.o. female with hx of nephrolithiasis, HTN, DM who presents with fevers, bilateral flank pain, and nausea for several days. Noted symptom onset Sunday consisting of fevers and nausea and bilateral flank pain, no specific dysuria, unable to note hematuria given patient currently menstruating. Patient s/p left ureteroscopy with note made of impacted stone on 7/15 with indwelling left ureteral stent with plans for removal later this week.   Tmax here 100.5, initially tachycardic however heart rate normalizing and normotensive on room air. UA dirty and even if clean sample obtained likely to appear dirty given indwelling ureteral stent, leukocytosis to 15.7, no AKI.   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   Assessment:  48 y.o. female with concern for pyelonephritis s/p left ureteroscopy 7/15 for impacted ureteral stone with ureteral stent currently in place and known R staghorn stone. Leukocytosis and vital signs concerning for systemic infection, most likely source being urine; however UA un-interpretable. Left stent in appropriate position, no concern for obstruction on right. Overall would plan to treat for pyelonephritis, no current indications for stent removal at this point in time, no indications for intervention on right stone at this time  Recommendations: - Please obtain urine culture - Agree with blood cultures, agree with broad spectrum antibiotics and  fluid resuscitation per primary with tailor to culture data - No indication for left stent removal or replacement - No indication currently for right stone intervention at this point in time    Past Medical History: Past Medical History:  Diagnosis Date   Diabetes mellitus without complication (HCC)    History of bronchitis    History of kidney stones    Hypertension     Past Surgical History:  Past Surgical History:  Procedure Laterality Date   ANKLE FRACTURE SURGERY Right    CESAREAN SECTION     CHOLECYSTECTOMY     CYSTOSCOPY W/ RETROGRADES Right 11/07/2019   Procedure: CYSTOSCOPY RIGHT URETERAL STENT PLACEMENT WITH RETROGRADE PYELOGRAM;  Surgeon: Watt Rush, MD;  Location: [facility];  Service: Urology;  Laterality: Right;   CYSTOSCOPY W/ URETERAL STENT PLACEMENT Left 12/27/2023   Procedure: CYSTOSCOPY, WITH RETROGRADE PYELOGRAM AND URETERAL STENT INSERTION;  Surgeon: Lovie Arlyss CROME, MD;  Location: [facility] OR;  Service: Urology;  Laterality: Left;   CYSTOSCOPY/URETEROSCOPY/HOLMIUM LASER/STENT PLACEMENT Right 11/23/2019   Procedure: RIGHT URETEROSCOPY//STENT EXCHANGE;  Surgeon: Watt Rush, MD;  Location: [facility];  Service: Urology;  Laterality: Right;   CYSTOSCOPY/URETEROSCOPY/HOLMIUM LASER/STENT PLACEMENT Left 01/11/2024   Procedure: CYSTOSCOPY/URETEROSCOPY/HOLMIUM LASER/STONE BASKET RETRIVAL/STENT EXCHANGE;  Surgeon: Lovie Arlyss CROME, MD;  Location[facility];  Service: Urology;  Laterality: Left;   DILATION AND CURETTAGE OF UTERUS      Medication: Current Facility-Administered Medications  Medication Dose Route Frequency Provider Last Rate Last Admin   HYDROmorphone  (DILAUDID ) injection 1 mg  1 mg Intravenous Once Trifan, Donnice PARAS, MD       lactated ringers  infusion   Intravenous Continuous Long, Fonda MATSU, MD 150 mL/hr at 02/01/24 1510 New  Bag at 02/01/24 1510   Current Outpatient Medications  Medication Sig Dispense Refill   acetaminophen  (TYLENOL ) 500 MG tablet Take 1,000 mg by mouth  every 6 (six) hours as needed for mild pain (pain score 1-3), moderate pain (pain score 4-6), fever or headache.     busPIRone  (BUSPAR ) 15 MG tablet Take 1 tablet (15 mg total) by mouth 2 (two) times daily. 60 tablet 2   celecoxib  (CELEBREX ) 200 MG capsule Take 1 capsule (200 mg total) by mouth 2 (two) times daily. 28 capsule 1   cephALEXin  (KEFLEX ) 500 MG capsule Take 1 capsule (500 mg total) by mouth 2 (two) times daily. 14 capsule 0   D-Mannose 500 MG CAPS Take 500 mg by mouth daily.     DULoxetine  (CYMBALTA ) 60 MG capsule Take 1 capsule by mouth once daily 90 capsule 0   hyoscyamine  (LEVSIN /SL) 0.125 MG SL tablet Place 1 tablet (0.125 mg total) under the tongue every 4 (four) hours as needed. 30 tablet 0   meclizine  (ANTIVERT ) 25 MG tablet Take 1 tablet (25 mg total) by mouth 3 (three) times daily as needed for dizziness. 30 tablet 0   metoprolol  succinate (TOPROL -XL) 50 MG 24 hr tablet Take 1.5 tablets (75 mg total) by mouth daily. Take with or immediately following a meal. 135 tablet 0   olmesartan  (BENICAR ) 20 MG tablet Take 1 tablet (20 mg total) by mouth daily.     pantoprazole  (PROTONIX ) 40 MG tablet Take 1 tablet (40 mg total) by mouth daily. 30 tablet 1   TAMSULOSIN  HCL PO Take by mouth.     traMADol  (ULTRAM ) 50 MG tablet Take 1 tablet (50 mg total) by mouth every 6 (six) hours as needed. 16 tablet 0   triamterene -hydrochlorothiazide  (MAXZIDE -25) 37.5-25 MG tablet Take 1 tablet by mouth once daily 90 tablet 2   valACYclovir  (VALTREX ) 1000 MG tablet Take 2 tablets (2,000 mg total) by mouth 2 (two) times daily. 4 tablet 2    Allergies: No Known Allergies  Social History: Social History   Tobacco Use   Smoking status: Former    Current packs/day: 0.00    Types: Cigarettes    Quit date: 2015    Years since quitting: 10.6   Smokeless tobacco: Never  Vaping Use   Vaping status: Never Used  Substance Use Topics   Alcohol use: Not Currently    Comment: Twice a month with all 3   2-3   Drug use: No    Family History Family History  Problem Relation Age of Onset   Heart disease Mother        CHF   Hypertension Mother    Diabetes Mother    Alcohol abuse Father    Schizophrenia Father    Bipolar disorder Father    Diabetes Maternal Grandmother    Cancer Maternal Grandmother        Breast cancer    Review of Systems 10 systems were reviewed and are negative except as noted specifically in the HPI.  Objective   Vital signs in last 24 hours: BP 118/70   Pulse 93   Temp (!) 100.5 F (38.1 C) (Oral)   Resp 16   LMP 02/01/2024   SpO2 90%   Physical Exam General: NAD, A&O, resting, appropriate HEENT: [place]/AT, EOMI, MMM Pulmonary: Normal work of breathing Cardiovascular: HDS, adequate peripheral perfusion Abdomen: Soft, NTTP, nondistended. GU: Voiding spontaneously, Bilateral CVA tenderness Extremities: warm and well perfused Neuro: Appropriate, no focal neurological deficits  Most  Recent Labs: Lab Results  Component Value Date   WBC 15.7 (H) 02/01/2024   HGB 12.3 02/01/2024   HCT 36.3 02/01/2024   PLT 305 02/01/2024    Lab Results  Component Value Date   NA 132 (L) 02/01/2024   K 3.4 (L) 02/01/2024   CL 99 02/01/2024   CO2 21 (L) 02/01/2024   BUN 13 02/01/2024   CREATININE 0.74 02/01/2024   CALCIUM 9.7 02/01/2024   MG 1.8 12/30/2023   PHOS 1.7 (L) 12/30/2023    Lab Results  Component Value Date   INR 1.0 12/27/2023   APTT 29 11/07/2019     Urine Culture: @[id number] (laburin,org,[id number])@   IMAGING: CT Renal Stone Study Result Date: 02/01/2024 CLINICAL DATA:  Provided history: Abdominal/flank pain, stone suspected EXAM: CT ABDOMEN AND PELVIS WITHOUT CONTRAST TECHNIQUE: Multidetector CT imaging of the abdomen and pelvis was performed following the standard protocol without IV contrast. RADIATION DOSE REDUCTION: This exam was performed according to the departmental dose-optimization program which includes automated exposure  control, adjustment of the mA and/or kV according to patient size and/or use of iterative reconstruction technique. COMPARISON:  Most recent CT 12/27/2023 FINDINGS: Lower chest: Subsegmental atelectasis or scarring within the lingula and both lower lobes. No pleural effusion. Hepatobiliary: The liver is enlarged spanning 21.9 cm cranial caudal with diffuse steatosis. Subtle capsular nodularity of the left hepatic lobe. No evidence of focal hepatic lesion on this unenhanced exam. Clips in the gallbladder fossa postcholecystectomy. No biliary dilatation. Pancreas: No ductal dilatation or inflammation. Spleen: Normal in size without focal abnormality. Adrenals/Urinary Tract: No adrenal nodule. Left nephroureteral stent in place, pigtails appropriately positioned. There is no hydronephrosis or renal collecting system dilatation. Mild left perinephric stranding persists. Punctate nonobstructing stone in the lower left kidney. No stone along the course of the ureteral stent, there is a left pelvic phlebolith. Large stone in the upper right kidney measuring 2.4 cm, without significant interval change. There is no right hydronephrosis or perinephric inflammation. The right ureter is decompressed. Urinary bladder is minimally distended. No definite perivesicular inflammation. Stomach/Bowel: Unremarkable appearance of the stomach. No bowel obstruction or inflammation. Small to moderate volume of formed stool in the colon. Normal appendix. Vascular/Lymphatic: Normal caliber abdominal aorta. Minimal aortic atherosclerosis. Scattered small retroperitoneal lymph nodes, not enlarged by size criteria. No suspicious lymphadenopathy. Reproductive: Retroverted uterus.  No adnexal mass. Other: No free air or free fluid. Minimal fat containing umbilical hernia. Musculoskeletal: There are no acute or suspicious osseous abnormalities. Lower lumbar facet hypertrophy again seen. IMPRESSION: 1. Left nephroureteral stent in place. No  hydronephrosis or renal collecting system dilatation. Mild left perinephric stranding persists. 2. Punctate nonobstructing stone in the lower left kidney. 3. Large nonobstructing stone in the upper right kidney measuring 2.4 cm, without significant interval change. 4. Hepatomegaly and hepatic steatosis. Subtle capsular nodularity of the left hepatic lobe. Recommend correlation with cirrhosis risk factors. Electronically Signed   By: Andrea Gasman M.D.   On: 02/01/2024 16:09   DG Chest 2 View Result Date: 02/01/2024 CLINICAL DATA:  Kidney stone. EXAM: CHEST - 2 VIEW COMPARISON:  Chest radiograph dated 12/27/2023. FINDINGS: The heart size and mediastinal contours are within normal limits. Both lungs are clear. The visualized skeletal structures are unremarkable. IMPRESSION: No active cardiopulmonary disease. Electronically Signed   By: Vanetta Chou M.D.   On: 02/01/2024 14:33    ------     Thank you for this consult. Please contact the urology consult pager with any further questions/concerns.

## 2024-02-01 NOTE — ED Provider Notes (Signed)
 [place] EMERGENCY DEPARTMENT AT [facility] Provider Note   CSN: [id number] Arrival date & time: 02/01/24  1255  Vanessa Bean is a 48 y.o. female presenting for bilateral flank pain.   -Bilateral flank pain starting Monday  -Fever starting Monday, temp >102 this morning  -Pain with peeing, increased urgency  -Unknown if blood in urine due to active menstruation  -Finished abx  -Thinks she felt stone move in R kidney   PMH R staghorn calculus seen on CT 6/30  L ureteral stent on 6/30  L ureteroscopy with litho and stent placement on 7/15     Prior to Admission medications   Medication Sig Start Date End Date Taking? Authorizing Provider  acetaminophen  (TYLENOL ) 500 MG tablet Take 1,000 mg by mouth every 6 (six) hours as needed for mild pain (pain score 1-3), moderate pain (pain score 4-6), fever or headache.    [provider]  busPIRone  (BUSPAR ) 15 MG tablet Take 1 tablet (15 mg total) by mouth 2 (two) times daily. 01/11/24   Wendee Lynwood HERO, NP  celecoxib  (CELEBREX ) 200 MG capsule Take 1 capsule (200 mg total) by mouth 2 (two) times daily. 01/11/24   Lovie Arlyss CROME, MD  cephALEXin  (KEFLEX ) 500 MG capsule Take 1 capsule (500 mg total) by mouth 2 (two) times daily. 01/11/24   Lovie Arlyss CROME, MD  D-Mannose 500 MG CAPS Take 500 mg by mouth daily.    [provider]  DULoxetine  (CYMBALTA ) 60 MG capsule Take 1 capsule by mouth once daily 12/20/23   Wendee Lynwood HERO, NP  hyoscyamine  (LEVSIN AMIEL) 0.125 MG SL tablet Place 1 tablet (0.125 mg total) under the tongue every 4 (four) hours as needed. 01/11/24   Lovie Arlyss CROME, MD  meclizine  (ANTIVERT ) 25 MG tablet Take 1 tablet (25 mg total) by mouth 3 (three) times daily as needed for dizziness. 10/16/23   Long, Joshua G, MD  metoprolol  succinate (TOPROL -XL) 50 MG 24 hr tablet Take 1.5 tablets (75 mg total) by mouth daily. Take with or immediately following a meal. 11/25/23   Wendee Lynwood HERO, NP  olmesartan  (BENICAR )  20 MG tablet Take 1 tablet (20 mg total) by mouth daily. 09/09/23   Wendee Lynwood HERO, NP  pantoprazole  (PROTONIX ) 40 MG tablet Take 1 tablet (40 mg total) by mouth daily. 01/06/24   Wendee Lynwood HERO, NP  TAMSULOSIN  HCL PO Take by mouth.    [provider]  traMADol  (ULTRAM ) 50 MG tablet Take 1 tablet (50 mg total) by mouth every 6 (six) hours as needed. 01/11/24   Lovie Arlyss CROME, MD  triamterene -hydrochlorothiazide  (MAXZIDE -25) 37.5-25 MG tablet Take 1 tablet by mouth once daily 06/14/23   Wendee Lynwood HERO, NP  valACYclovir  (VALTREX ) 1000 MG tablet Take 2 tablets (2,000 mg total) by mouth 2 (two) times daily. 06/10/23   Wendee Lynwood HERO, NP    Allergies: Patient has no known allergies.    Review of Systems  Constitutional:  Positive for fever.  Genitourinary:  Positive for dysuria and flank pain.    Updated Vital Signs BP 111/77 (BP Location: Left Arm)   Pulse (!) 111   Temp (!) 100.5 F (38.1 C) (Oral)   Resp 20   LMP 02/01/2024   SpO2 100%   Physical Exam Constitutional:      General: She is in acute distress.  Cardiovascular:     Rate and Rhythm: Regular rhythm. Tachycardia present.     Heart sounds: Normal heart sounds.  Pulmonary:  Breath sounds: Normal breath sounds.     Comments: Increased respiratory rate Abdominal:     Palpations: Abdomen is soft.     Tenderness: There is abdominal tenderness. There is right CVA tenderness and left CVA tenderness. There is no guarding or rebound.  Skin:    General: Skin is warm and dry.  Neurological:     Mental Status: She is alert.     (all labs ordered are listed, but only abnormal results are displayed) Labs Reviewed  I-STAT CG4 LACTIC ACID, ED - Abnormal; Notable for the following components:      Result Value   Lactic Acid, Venous 2.2 (*)    All other components within normal limits  URINE CULTURE  COMPREHENSIVE METABOLIC PANEL WITH GFR  CBC WITH DIFFERENTIAL/PLATELET  URINALYSIS, W/ REFLEX TO CULTURE (INFECTION  SUSPECTED)  HCG, SERUM, QUALITATIVE    EKG: None  Radiology: No results found.   Procedures   Medications Ordered in the ED  ketorolac  (TORADOL ) 30 MG/ML injection 30 mg (has no administration in time range)  cefTRIAXone  (ROCEPHIN ) 2 g in sodium chloride  0.9 % 100 mL IVPB (has no administration in time range)  ondansetron  (ZOFRAN ) injection 4 mg (has no administration in time range)    Medical Decision Making Amount and/or Complexity of Data Reviewed Labs: ordered. Radiology: ordered.  Risk Prescription drug management.   Vanessa Bean is a 48 y.o. female is here with bilateral flank pain.    Lab Tests:  CBC: WBC 15.7 CMP largely unremarkable  Lactic acid 2.2 UA suggestive of infection  Urine cx pending   Imaging Studies ordered:  CT Renal:  CXR: Unremarkable  Medicines ordered:  Rocephin  2g  Toradol  x1 for pain  Zofran    Plan Patient presenting with bilateral flank pain with hx recent bilateral nephrolithiasis s/p L lithotripsy w stent placement. UA appear infectious. Rocephin  given for UTI/Pyelo. Lactic acidosis 2.2, fluid bolus given and maintenance fluids initiated. CT Renal pending. Urine cx, blood cx pending.   Patient care transferred to next ED shift team.   Final diagnoses:  None    ED Discharge Orders     None          Diona Perkins, MD 02/01/24 1503    Darra Fonda MATSU, MD 02/03/24 1114

## 2024-02-01 NOTE — ED Notes (Signed)
 Pt to xray

## 2024-02-02 LAB — CBC
HCT: 32.8 % — ABNORMAL LOW (ref 36.0–46.0)
Hemoglobin: 10.5 g/dL — ABNORMAL LOW (ref 12.0–15.0)
MCH: 31.7 pg (ref 26.0–34.0)
MCHC: 32 g/dL (ref 30.0–36.0)
MCV: 99.1 fL (ref 80.0–100.0)
Platelets: 268 K/uL (ref 150–400)
RBC: 3.31 MIL/uL — ABNORMAL LOW (ref 3.87–5.11)
RDW: 14.2 % (ref 11.5–15.5)
WBC: 13 K/uL — ABNORMAL HIGH (ref 4.0–10.5)
nRBC: 0 % (ref 0.0–0.2)

## 2024-02-02 LAB — BASIC METABOLIC PANEL WITH GFR
Anion gap: 12 (ref 5–15)
BUN: 16 mg/dL (ref 6–20)
CO2: 20 mmol/L — ABNORMAL LOW (ref 22–32)
Calcium: 8.5 mg/dL — ABNORMAL LOW (ref 8.9–10.3)
Chloride: 102 mmol/L (ref 98–111)
Creatinine, Ser: 0.84 mg/dL (ref 0.44–1.00)
GFR, Estimated: >60 mL/min (ref >60)
Glucose, Bld: 239 mg/dL — ABNORMAL HIGH (ref 70–99)
Potassium: 3.3 mmol/L — ABNORMAL LOW (ref 3.5–5.1)
Sodium: 134 mmol/L — ABNORMAL LOW (ref 135–145)

## 2024-02-02 LAB — GLUCOSE, CAPILLARY
Glucose-Capillary: 131 mg/dL — ABNORMAL HIGH (ref 70–99)
Glucose-Capillary: 182 mg/dL — ABNORMAL HIGH (ref 70–99)
Glucose-Capillary: 160 mg/dL — ABNORMAL HIGH (ref 70–99)
Glucose-Capillary: 199 mg/dL — ABNORMAL HIGH (ref 70–99)

## 2024-02-02 LAB — MAGNESIUM: Magnesium: 1.6 mg/dL — ABNORMAL LOW (ref 1.7–2.4)

## 2024-02-02 MED ORDER — MAGNESIUM SULFATE 2 GM/50ML IV SOLN
2.0000 g | Freq: Once | INTRAVENOUS | Status: AC
  Administered 2024-02-02: 2 g via INTRAVENOUS
  Filled 2024-02-02: qty 50

## 2024-02-02 MED ORDER — HYDROMORPHONE HCL 1 MG/ML IJ SOLN
0.5000 mg | INTRAMUSCULAR | Status: DC | PRN
  Administered 2024-02-02 (×5): 1 mg via INTRAVENOUS
  Filled 2024-02-02 (×7): qty 1

## 2024-02-02 MED ORDER — SODIUM CHLORIDE 0.9 % IV SOLN: INTRAVENOUS | Status: DC

## 2024-02-02 MED ORDER — LIDOCAINE 5 % EX PTCH
1.0000 | MEDICATED_PATCH | CUTANEOUS | Status: AC
  Administered 2024-02-02: 1 via TRANSDERMAL
  Filled 2024-02-02: qty 1

## 2024-02-02 MED ORDER — POTASSIUM CHLORIDE CRYS ER 20 MEQ PO TBCR
40.0000 meq | EXTENDED_RELEASE_TABLET | Freq: Two times a day (BID) | ORAL | Status: AC
  Administered 2024-02-02 (×2): 40 meq via ORAL
  Filled 2024-02-02 (×2): qty 2

## 2024-02-02 MED ORDER — IBUPROFEN 200 MG PO TABS
400.0000 mg | ORAL_TABLET | Freq: Four times a day (QID) | ORAL | Status: DC | PRN
  Administered 2024-02-02 (×2): 400 mg via ORAL
  Filled 2024-02-02 (×2): qty 2

## 2024-02-02 MED ORDER — TRIAMTERENE-HCTZ 37.5-25 MG PO TABS
1.0000 | ORAL_TABLET | Freq: Every day | ORAL | Status: DC
  Filled 2024-02-02: qty 1

## 2024-02-02 MED ORDER — IBUPROFEN 200 MG PO TABS: 400.0000 mg | ORAL_TABLET | ORAL | Status: DC | PRN

## 2024-02-02 MED ORDER — MAGNESIUM OXIDE -MG SUPPLEMENT 400 (240 MG) MG PO TABS
400.0000 mg | ORAL_TABLET | Freq: Two times a day (BID) | ORAL | Status: DC
  Administered 2024-02-02 – 2024-02-03 (×3): 400 mg via ORAL
  Filled 2024-02-02 (×3): qty 1

## 2024-02-02 MED ORDER — ORAL CARE MOUTH RINSE: 15.0000 mL | OROMUCOSAL | Status: DC | PRN

## 2024-02-02 NOTE — Progress Notes (Signed)
 Pt at 00:35 states that she had 12 out 10 breast pain.  She wasn't going to tell me due to this is  the same pain she had when she was in the hospital at [facility] and nothing was done. I  messaged the on call provider. A lidocaine  patch was given and dilaudad was increased to 1mg  every 4  hours from 0.5mg . Pt stated she was on her period as well.   I gave pt valtrex  and Zofran  for nausea.  At 0045. I checked on the  pt around 1:15 pt said pain when from a 12 to 6 in her breast and no nausea.  Messaged on call provider at 2:59 am regarding a temp of 102.8. Tylenol  was given and ice packs were applied to back of neck and axillary, and back of knees. Mews when to red status. Rapid response nurse(Timber) was contacted to inform.   Temp recheck at 403 am 101.1.  I checked the temp again at 5:39, it was 99. Pt blood pressure is soft and pt heart rate remained Sinus Tach.

## 2024-02-02 NOTE — Progress Notes (Signed)
 PROGRESS NOTE  Vanessa Bean FMW:[id number] DOB: January 14, 1976 DOA: 02/01/2024 PCP: Wendee Lynwood HERO, NP   LOS: 1 day   Brief narrative:   Patient is a 48 years old female with past medical history of diabetes mellitus type 2, history of kidney stones, hypertension presented to the hospital with fever with nausea and bilateral flank pain, increased urgency, mild dysuria and fever at home.  Of note, patient had left ureteroscopy for impacted stone on 01/11/24 with indwelling left ureteral stent with plans for removal later this week by urology.  In the ED, patient was febrile with a temperature 100.5 F with tachycardia.  She had bilateral CV angle tenderness more on the right side.  CBC showed leukocytosis at 15.7.  Lactate was elevated at 2.2.  Urinalysis was abnormal.  Imaging showed left stent in place without any hydronephrosis with the right staghorn calculus at 2.4 cm without any perinephric stranding.  In the ED, patient received 2 L of IV fluid bolus, IV Rocephin , cultures were sent and patient was considered for admission to the hospital for further evaluation and treatment.     Assessment/Plan: Principal Problem:   Acute pyelonephritis Active Problems:   Primary hypertension   Class 3 severe obesity due to excess calories with serious comorbidity and body mass index (BMI) of 50.0 to 59.9 in adult   Sepsis due to urinary tract infection (HCC)   Kidney stone  Sepsis secondary to acute pyelonephritis status post left ureteroscopy on 01/11/24 for impacted ureteral stone with ureteral stent in place.   History of right known staghorn calculus. Urology was consulted and at this time and plan for conservative treatment with antibiotic and no indication for stent removal.  Blood cultures negative in less than 12 hours.  Urine culture pending.  Feels a little better today with less pain but he still has chills and diaphoresis.   Diabetes mellitus type 2. Latest hemoglobin A1c of 6.7 on 7/ 3/25.   Continue sliding scale insulin  and diabetic diet.     Essential hypertension. Continue metoprolol  and Benicar .  Patient received IV fluids yesterday so HCTZ and triamterene  on hold.  Will continue to hold again today.   History of anxiety on buspirone  and duloxetine .  Will continue.   Mild hypokalemia.  Will continue to replenish with potassium.  Check BMP in AM.  Mild hypomagnesemia.  Will replace through IV and orally.  Check levels in a.m.   GERD on Protonix .  Will continue.   Mild hyponatremia.  Sodium level at 134 today.  On normal saline.  Will discontinue at this time.  Hold triamterene  HCTZ for today  Class III obesity. Body mass index is 53.04 kg/m.  Patient would benefit from ongoing weight loss as outpatient.  DVT prophylaxis: enoxaparin  (LOVENOX ) injection 40 mg Start: 02/01/24 1900 SCDs Start: 02/01/24 1805   Disposition: Home likely in 1 to 2 days  Status is: Inpatient Remains inpatient appropriate because: Pending clinical improvement    Code Status:     Code Status: Full Code  Family Communication: Spoke with the patient's spouse at bedside  Consultants: Urology  Procedures: None  Anti-infectives:  Rocephin  IV  Anti-infectives (From admission, onward)    Start     Dose/Rate Route Frequency Ordered Stop   02/02/24 1430  cefTRIAXone  (ROCEPHIN ) 1 g in sodium chloride  0.9 % 100 mL IVPB  Status:  Discontinued        1 g 200 mL/hr over 30 Minutes Intravenous Every 24 hours 02/01/24 1813 02/01/24  1841   02/02/24 1430  cefTRIAXone  (ROCEPHIN ) 2 g in sodium chloride  0.9 % 100 mL IVPB        2 g 200 mL/hr over 30 Minutes Intravenous Every 24 hours 02/01/24 1841     02/01/24 1803  valACYclovir  (VALTREX ) tablet 2,000 mg        2,000 mg Oral 2 times daily PRN 02/01/24 1804     02/01/24 1400  cefTRIAXone  (ROCEPHIN ) 2 g in sodium chloride  0.9 % 100 mL IVPB        2 g 200 mL/hr over 30 Minutes Intravenous  Once 02/01/24 1349 02/01/24 1617         Subjective: Today, patient was seen and examined at bedside.  Patient states that she feels a little better with pain today.  Still has some diaphoresis and chills but urinary symptoms have improved.  Objective: Vitals:   02/02/24 0527 02/02/24 0801  BP:  120/74  Pulse:  (!) 104  Resp: 15 20  Temp: 99 F (37.2 C) (!) 100.8 F (38.2 C)  SpO2:  100%    Intake/Output Summary (Last 24 hours) at 02/02/2024 1320 Last data filed at 02/02/2024 1100 Gross per 24 hour  Intake 2386.18 ml  Output --  Net 2386.18 ml   Filed Weights   02/01/24 1831 02/02/24 0500  Weight: (!) 140.2 kg (!) 148.2 kg   Body mass index is 56.08 kg/m.   Physical Exam: GENERAL: Patient is alert awake and oriented. Not in obvious distress.  Obese build. HENT: No scleral pallor or icterus. Pupils equally reactive to light. Oral mucosa is moist NECK: is supple, no gross swelling noted. CHEST: Clear to auscultation. No crackles or wheezes.  Diminished breath sounds bilaterally. CVS: S1 and S2 heard, no murmur. Regular rate and rhythm.  ABDOMEN: Soft, non-tender, bowel sounds are present.  Right costovertebral angle tenderness noted EXTREMITIES: No edema. CNS: Cranial nerves are intact. No focal motor deficits. SKIN: warm and dry without rashes.  Diaphoretic skin  Data Review: I have personally reviewed the following laboratory data and studies,  CBC: Recent Labs  Lab 02/01/24 1321 02/02/24 0421  WBC 15.7* 13.0*  NEUTROABS 12.5*  --   HGB 12.3 10.5*  HCT 36.3 32.8*  MCV 95.0 99.1  PLT 305 268   Basic Metabolic Panel: Recent Labs  Lab 02/01/24 1321 02/02/24 0421  NA 132* 134*  K 3.4* 3.3*  CL 99 102  CO2 21* 20*  GLUCOSE 190* 239*  BUN 13 16  CREATININE 0.74 0.84  CALCIUM 9.7 8.5*  MG  --  1.6*   Liver Function Tests: Recent Labs  Lab 02/01/24 1321  AST 24  ALT 29  ALKPHOS 91  BILITOT 1.1  PROT 7.5  ALBUMIN 3.5   No results for input(s): LIPASE, AMYLASE in the last 168  hours. No results for input(s): AMMONIA in the last 168 hours. Cardiac Enzymes: No results for input(s): CKTOTAL, CKMB, CKMBINDEX, TROPONINI in the last 168 hours. BNP (last 3 results) No results for input(s): BNP in the last 8760 hours.  ProBNP (last 3 results) No results for input(s): PROBNP in the last 8760 hours.  CBG: Recent Labs  Lab 02/01/24 2116 02/02/24 0737 02/02/24 1140  GLUCAP 241* 131* 182*   Recent Results (from the past 240 hours)  Blood culture (routine x 2)     Status: None (Preliminary result)   Collection Time: 02/01/24  3:14 PM   Specimen: BLOOD RIGHT ARM  Result Value Ref Range Status  Specimen Description   Final    BLOOD RIGHT ARM Performed at [facility], [address] [place], [place] [address]    Special Requests   Final    BOTTLES DRAWN AEROBIC AND ANAEROBIC Blood Culture adequate volume Performed at [facility], [address], [place], [place] [address]    Culture   Final    NO GROWTH < 12 HOURS Performed at [facility], [address], [place], [place] [address]    Report Status PENDING  Incomplete  Blood culture (routine x 2)     Status: None (Preliminary result)   Collection Time: 02/01/24  6:58 PM   Specimen: BLOOD LEFT ARM  Result Value Ref Range Status   Specimen Description   Final    BLOOD LEFT ARM Performed at [facility], [address] [place], [place] [address]    Special Requests   Final    BOTTLES DRAWN AEROBIC AND ANAEROBIC Blood Culture adequate volume Performed at [facility], [address] [place], [place] [address]    Culture   Final    NO GROWTH < 12 HOURS Performed at [facility], [address] [place], [place] [address]    Report Status PENDING  Incomplete     Studies: CT Renal Stone Study Result Date: 02/01/2024 CLINICAL DATA:  Provided history: Abdominal/flank pain, stone suspected EXAM: CT ABDOMEN AND PELVIS WITHOUT  CONTRAST TECHNIQUE: Multidetector CT imaging of the abdomen and pelvis was performed following the standard protocol without IV contrast. RADIATION DOSE REDUCTION: This exam was performed according to the departmental dose-optimization program which includes automated exposure control, adjustment of the mA and/or kV according to patient size and/or use of iterative reconstruction technique. COMPARISON:  Most recent CT 12/27/2023 FINDINGS: Lower chest: Subsegmental atelectasis or scarring within the lingula and both lower lobes. No pleural effusion. Hepatobiliary: The liver is enlarged spanning 21.9 cm cranial caudal with diffuse steatosis. Subtle capsular nodularity of the left hepatic lobe. No evidence of focal hepatic lesion on this unenhanced exam. Clips in the gallbladder fossa postcholecystectomy. No biliary dilatation. Pancreas: No ductal dilatation or inflammation. Spleen: Normal in size without focal abnormality. Adrenals/Urinary Tract: No adrenal nodule. Left nephroureteral stent in place, pigtails appropriately positioned. There is no hydronephrosis or renal collecting system dilatation. Mild left perinephric stranding persists. Punctate nonobstructing stone in the lower left kidney. No stone along the course of the ureteral stent, there is a left pelvic phlebolith. Large stone in the upper right kidney measuring 2.4 cm, without significant interval change. There is no right hydronephrosis or perinephric inflammation. The right ureter is decompressed. Urinary bladder is minimally distended. No definite perivesicular inflammation. Stomach/Bowel: Unremarkable appearance of the stomach. No bowel obstruction or inflammation. Small to moderate volume of formed stool in the colon. Normal appendix. Vascular/Lymphatic: Normal caliber abdominal aorta. Minimal aortic atherosclerosis. Scattered small retroperitoneal lymph nodes, not enlarged by size criteria. No suspicious lymphadenopathy. Reproductive: Retroverted  uterus.  No adnexal mass. Other: No free air or free fluid. Minimal fat containing umbilical hernia. Musculoskeletal: There are no acute or suspicious osseous abnormalities. Lower lumbar facet hypertrophy again seen. IMPRESSION: 1. Left nephroureteral stent in place. No hydronephrosis or renal collecting system dilatation. Mild left perinephric stranding persists. 2. Punctate nonobstructing stone in the lower left kidney. 3. Large nonobstructing stone in the upper right kidney measuring 2.4 cm, without significant interval change. 4. Hepatomegaly and hepatic steatosis. Subtle capsular nodularity of the left hepatic  lobe. Recommend correlation with cirrhosis risk factors. Electronically Signed   By: Andrea Gasman M.D.   On: 02/01/2024 16:09   DG Chest 2 View Result Date: 02/01/2024 CLINICAL DATA:  Kidney stone. EXAM: CHEST - 2 VIEW COMPARISON:  Chest radiograph dated 12/27/2023. FINDINGS: The heart size and mediastinal contours are within normal limits. Both lungs are clear. The visualized skeletal structures are unremarkable. IMPRESSION: No active cardiopulmonary disease. Electronically Signed   By: Vanetta Chou M.D.   On: 02/01/2024 14:33      Vernal Alstrom, MD  [facility] 02/02/2024  If 7PM-7AM, please contact night-coverage

## 2024-02-02 NOTE — Plan of Care (Signed)
  Problem: Education: Goal: Knowledge of General Education information will improve Description: Including pain rating scale, medication(s)/side effects and non-pharmacologic comfort measures Outcome: Progressing   Problem: Health Behavior/Discharge Planning: Goal: Ability to manage health-related needs will improve Outcome: Progressing   Problem: Clinical Measurements: Goal: Ability to maintain clinical measurements within normal limits will improve Outcome: Progressing Goal: Will remain free from infection Outcome: Progressing Goal: Diagnostic test results will improve Outcome: Progressing Goal: Respiratory complications will improve Outcome: Progressing Goal: Cardiovascular complication will be avoided Outcome: Progressing   Problem: Activity: Goal: Risk for activity intolerance will decrease Outcome: Progressing   Problem: Nutrition: Goal: Adequate nutrition will be maintained Outcome: Progressing   Problem: Coping: Goal: Level of anxiety will decrease Outcome: Progressing   Problem: Elimination: Goal: Will not experience complications related to bowel motility Outcome: Progressing Goal: Will not experience complications related to urinary retention Outcome: Progressing   Problem: Pain Managment: Goal: General experience of comfort will improve and/or be controlled Outcome: Progressing   Problem: Safety: Goal: Ability to remain free from injury will improve Outcome: Progressing   Problem: Skin Integrity: Goal: Risk for impaired skin integrity will decrease Outcome: Progressing   Problem: Education: Goal: Ability to describe self-care measures that may prevent or decrease complications (Diabetes Survival Skills Education) will improve Outcome: Progressing Goal: Individualized Educational Video(s) Outcome: Progressing   Problem: Coping: Goal: Ability to adjust to condition or change in health will improve Outcome: Progressing   Problem: Fluid  Volume: Goal: Ability to maintain a balanced intake and output will improve Outcome: Progressing   Problem: Health Behavior/Discharge Planning: Goal: Ability to identify and utilize available resources and services will improve Outcome: Progressing Goal: Ability to manage health-related needs will improve Outcome: Progressing   Problem: Metabolic: Goal: Ability to maintain appropriate glucose levels will improve Outcome: Progressing   Problem: Nutritional: Goal: Maintenance of adequate nutrition will improve Outcome: Progressing Goal: Progress toward achieving an optimal weight will improve Outcome: Progressing   Problem: Skin Integrity: Goal: Risk for impaired skin integrity will decrease Outcome: Progressing   Problem: Tissue Perfusion: Goal: Adequacy of tissue perfusion will improve Outcome: Progressing  Pt has 12 out 10 pain in her breast when she is sick. She said she experienced this pain when kidney issues  before at [facility].

## 2024-02-03 ENCOUNTER — Other Ambulatory Visit: Payer: Self-pay

## 2024-02-03 LAB — BASIC METABOLIC PANEL WITH GFR
Anion gap: 9 (ref 5–15)
BUN: 14 mg/dL (ref 6–20)
CO2: 19 mmol/L — ABNORMAL LOW (ref 22–32)
Calcium: 8.5 mg/dL — ABNORMAL LOW (ref 8.9–10.3)
Chloride: 107 mmol/L (ref 98–111)
Creatinine, Ser: 0.62 mg/dL (ref 0.44–1.00)
GFR, Estimated: >60 mL/min (ref >60)
Glucose, Bld: 171 mg/dL — ABNORMAL HIGH (ref 70–99)
Potassium: 3.8 mmol/L (ref 3.5–5.1)
Sodium: 135 mmol/L (ref 135–145)

## 2024-02-03 LAB — CBC
HCT: 30.3 % — ABNORMAL LOW (ref 36.0–46.0)
Hemoglobin: 9.7 g/dL — ABNORMAL LOW (ref 12.0–15.0)
MCH: 32.3 pg (ref 26.0–34.0)
MCHC: 32 g/dL (ref 30.0–36.0)
MCV: 101 fL — ABNORMAL HIGH (ref 80.0–100.0)
Platelets: 270 K/uL (ref 150–400)
RBC: 3 MIL/uL — ABNORMAL LOW (ref 3.87–5.11)
RDW: 14.5 % (ref 11.5–15.5)
WBC: 11.4 K/uL — ABNORMAL HIGH (ref 4.0–10.5)
nRBC: 0 % (ref 0.0–0.2)

## 2024-02-03 LAB — URINE CULTURE: Culture: >=100000 — AB

## 2024-02-03 LAB — GLUCOSE, CAPILLARY: Glucose-Capillary: 180 mg/dL — ABNORMAL HIGH (ref 70–99)

## 2024-02-03 LAB — MAGNESIUM: Magnesium: 1.8 mg/dL (ref 1.7–2.4)

## 2024-02-03 MED ORDER — AMOXICILLIN 500 MG PO CAPS: 500.0000 mg | ORAL_CAPSULE | Freq: Three times a day (TID) | ORAL | 0 refills | Status: AC

## 2024-02-03 NOTE — Plan of Care (Signed)

## 2024-02-03 NOTE — Discharge Summary (Signed)
 Physician Discharge Summary  Vanessa Bean FMW:[id number] DOB: June 28, 1976 DOA: 02/01/2024  PCP: Wendee Lynwood HERO, NP  Admit date: 02/01/2024 Discharge date: 02/03/2024  Admitted From: Home  Discharge disposition: Home   Recommendations for Outpatient Follow-Up:   Follow up with your primary care provider in one week.  Check CBC, BMP, magnesium  in the next visit Follow-up with Dr. Avonne in urology for stent removal in 1 week.   Discharge Diagnosis:   Principal Problem:   Acute pyelonephritis Active Problems:   Primary hypertension   Class 3 severe obesity due to excess calories with serious comorbidity and body mass index (BMI) of 50.0 to 59.9 in adult   Sepsis due to urinary tract infection (HCC)   Kidney stone   Discharge Condition: Improved.  Diet recommendation: Low sodium, heart healthy.   Wound care: None.  Code status: Full.   History of Present Illness:   Patient is a 48 years old female with past medical history of diabetes mellitus type 2, history of kidney stones, hypertension presented to the hospital with fever with nausea and bilateral flank pain, increased urgency, mild dysuria and fever at home.  Of note, patient had left ureteroscopy for impacted stone on 01/11/24 with indwelling left ureteral stent with plans for removal later this week by urology.  In the ED, patient was febrile with a temperature 100.5 F with tachycardia.  She had bilateral CV angle tenderness more on the right side.  CBC showed leukocytosis at 15.7.  Lactate was elevated at 2.2.  Urinalysis was abnormal.  Imaging showed left stent in place without any hydronephrosis with the right staghorn calculus at 2.4 cm without any perinephric stranding.  In the ED, patient received 2 L of IV fluid bolus, IV Rocephin , cultures were sent and patient was considered for admission to the hospital for further evaluation and treatment.     Hospital Course:   Following conditions were addressed during  hospitalization as listed below,  Sepsis secondary to acute enterococcal pyelonephritis status post left ureteroscopy on 01/11/24 for impacted ureteral stone with ureteral stent in place.   History of right known staghorn calculus. Urology was consulted and at this time and plan for conservative treatment with antibiotic and no indication for stent removal.  Urine culture positive for Enterococcus.  Sensitive to multiple antibiotic including ampicillin.SABRA  Received Rocephin  with significant improvement while in the hospital.  Will change to amoxicillin  500 mg p.o. 3 times daily for next 7 days on discharge.  Patient has significantly felt better with no pain.  No fever or chills.  Wishes to go home.     Diabetes mellitus type 2. Latest hemoglobin A1c of 6.7 on 7/ 3/25.  Not on medications at home.  Continue diabetic diet.  Recommend closer monitoring with her PCP.   Essential hypertension. On metoprolol , triamterene  HCTZ and Benicar  at home.  Will resume in couple of days since the blood pressure was marginally low in the hospital.   History of anxiety on buspirone  and duloxetine .  Will continue.   Mild hypokalemia.  Replenished and improved   Mild hypomagnesemia.  Will replace through IV and orally.  Check levels in a.m.   GERD on Protonix .  Will continue.   Mild hyponatremia.  Improved after hydration.  Sodium prior to discharge was 135.   Class III obesity. Body mass index is 53.04 kg/m.  Patient would benefit from ongoing weight loss as outpatient.   Disposition.  At this time, patient is stable for disposition  home with outpatient PCP and urology follow-up  Medical Consultants:   Urology  Procedures:    None Subjective:   Today, patient was seen and examined at bedside.  Patient continues to feel better.  No diaphoresis fever chills.  Denies any pain on the back.  No dysuria or urgency but still has some frequency.  Discharge Exam:   Vitals:   02/03/24 0429 02/03/24 1022   BP: 116/60 117/69  Pulse: (!) 103 (!) 106  Resp:  16  Temp: 98.1 F (36.7 C) 98.1 F (36.7 C)  SpO2: 96% 100%   Vitals:   02/03/24 0026 02/03/24 0429 02/03/24 0445 02/03/24 1022  BP: 125/72 116/60  117/69  Pulse: (!) 120 (!) 103  (!) 106  Resp: 16   16  Temp: 99.6 F (37.6 C) 98.1 F (36.7 C)  98.1 F (36.7 C)  TempSrc: Oral Oral  Oral  SpO2: 95% 96%  100%  Weight:   (!) 145 kg   Height:       Body mass index is 54.87 kg/m.  General: Alert awake, not in obvious distress, obese HENT: pupils equally reacting to light,  No scleral pallor or icterus noted. Oral mucosa is moist.  Chest:  Clear breath sounds.  Diminished breath sounds bilaterally.  CVS: S1 &S2 heard. No murmur.  Regular rate and rhythm. Abdomen: Soft, nontender, nondistended.  Bowel sounds are heard.  No costovertebral angle tenderness noted Extremities: No cyanosis, clubbing or edema.  Peripheral pulses are palpable. Psych: Alert, awake and oriented, normal mood CNS:  No cranial nerve deficits.  Power equal in all extremities.   Skin: Warm and dry.  No rashes noted.  The results of significant diagnostics from this hospitalization (including imaging, microbiology, ancillary and laboratory) are listed below for reference.     Diagnostic Studies:   CT Renal Stone Study Result Date: 02/01/2024 CLINICAL DATA:  Provided history: Abdominal/flank pain, stone suspected EXAM: CT ABDOMEN AND PELVIS WITHOUT CONTRAST TECHNIQUE: Multidetector CT imaging of the abdomen and pelvis was performed following the standard protocol without IV contrast. RADIATION DOSE REDUCTION: This exam was performed according to the departmental dose-optimization program which includes automated exposure control, adjustment of the mA and/or kV according to patient size and/or use of iterative reconstruction technique. COMPARISON:  Most recent CT 12/27/2023 FINDINGS: Lower chest: Subsegmental atelectasis or scarring within the lingula and both lower  lobes. No pleural effusion. Hepatobiliary: The liver is enlarged spanning 21.9 cm cranial caudal with diffuse steatosis. Subtle capsular nodularity of the left hepatic lobe. No evidence of focal hepatic lesion on this unenhanced exam. Clips in the gallbladder fossa postcholecystectomy. No biliary dilatation. Pancreas: No ductal dilatation or inflammation. Spleen: Normal in size without focal abnormality. Adrenals/Urinary Tract: No adrenal nodule. Left nephroureteral stent in place, pigtails appropriately positioned. There is no hydronephrosis or renal collecting system dilatation. Mild left perinephric stranding persists. Punctate nonobstructing stone in the lower left kidney. No stone along the course of the ureteral stent, there is a left pelvic phlebolith. Large stone in the upper right kidney measuring 2.4 cm, without significant interval change. There is no right hydronephrosis or perinephric inflammation. The right ureter is decompressed. Urinary bladder is minimally distended. No definite perivesicular inflammation. Stomach/Bowel: Unremarkable appearance of the stomach. No bowel obstruction or inflammation. Small to moderate volume of formed stool in the colon. Normal appendix. Vascular/Lymphatic: Normal caliber abdominal aorta. Minimal aortic atherosclerosis. Scattered small retroperitoneal lymph nodes, not enlarged by size criteria. No suspicious lymphadenopathy. Reproductive: Retroverted uterus.  No adnexal mass. Other: No free air or free fluid. Minimal fat containing umbilical hernia. Musculoskeletal: There are no acute or suspicious osseous abnormalities. Lower lumbar facet hypertrophy again seen. IMPRESSION: 1. Left nephroureteral stent in place. No hydronephrosis or renal collecting system dilatation. Mild left perinephric stranding persists. 2. Punctate nonobstructing stone in the lower left kidney. 3. Large nonobstructing stone in the upper right kidney measuring 2.4 cm, without significant interval  change. 4. Hepatomegaly and hepatic steatosis. Subtle capsular nodularity of the left hepatic lobe. Recommend correlation with cirrhosis risk factors. Electronically Signed   By: Andrea Gasman M.D.   On: 02/01/2024 16:09   DG Chest 2 View Result Date: 02/01/2024 CLINICAL DATA:  Kidney stone. EXAM: CHEST - 2 VIEW COMPARISON:  Chest radiograph dated 12/27/2023. FINDINGS: The heart size and mediastinal contours are within normal limits. Both lungs are clear. The visualized skeletal structures are unremarkable. IMPRESSION: No active cardiopulmonary disease. Electronically Signed   By: Vanetta Chou M.D.   On: 02/01/2024 14:33     Labs:   Basic Metabolic Panel: Recent Labs  Lab 02/01/24 1321 02/02/24 0421 02/03/24 0400  NA 132* 134* 135  K 3.4* 3.3* 3.8  CL 99 102 107  CO2 21* 20* 19*  GLUCOSE 190* 239* 171*  BUN 13 16 14   CREATININE 0.74 0.84 0.62  CALCIUM 9.7 8.5* 8.5*  MG  --  1.6* 1.8   GFR Estimated Creatinine Clearance: 126 mL/min (by C-G formula based on SCr of 0.62 mg/dL). Liver Function Tests: Recent Labs  Lab 02/01/24 1321  AST 24  ALT 29  ALKPHOS 91  BILITOT 1.1  PROT 7.5  ALBUMIN 3.5   No results for input(s): LIPASE, AMYLASE in the last 168 hours. No results for input(s): AMMONIA in the last 168 hours. Coagulation profile No results for input(s): INR, PROTIME in the last 168 hours.  CBC: Recent Labs  Lab 02/01/24 1321 02/02/24 0421 02/03/24 0400  WBC 15.7* 13.0* 11.4*  NEUTROABS 12.5*  --   --   HGB 12.3 10.5* 9.7*  HCT 36.3 32.8* 30.3*  MCV 95.0 99.1 101.0*  PLT 305 268 270   Cardiac Enzymes: No results for input(s): CKTOTAL, CKMB, CKMBINDEX, TROPONINI in the last 168 hours. BNP: Invalid input(s): POCBNP CBG: Recent Labs  Lab 02/02/24 0737 02/02/24 1140 02/02/24 1652 02/02/24 2135 02/03/24 0801  GLUCAP 131* 182* 160* 199* 180*   D-Dimer No results for input(s): DDIMER in the last 72 hours. Hgb A1c No results  for input(s): HGBA1C in the last 72 hours. Lipid Profile No results for input(s): CHOL, HDL, LDLCALC, TRIG, CHOLHDL, LDLDIRECT in the last 72 hours. Thyroid  function studies No results for input(s): TSH, T4TOTAL, T3FREE, THYROIDAB in the last 72 hours.  Invalid input(s): FREET3 Anemia work up No results for input(s): VITAMINB12, FOLATE, FERRITIN, TIBC, IRON, RETICCTPCT in the last 72 hours. Microbiology Recent Results (from the past 240 hours)  Urine Culture     Status: Abnormal   Collection Time: 02/01/24  1:21 PM   Specimen: Urine, Clean Catch  Result Value Ref Range Status   Specimen Description   Final    URINE, CLEAN CATCH Performed at [facility], [address] [place], [place] [address]    Special Requests   Final    NONE Performed at [facility], [address], [place], [place] [address]    Culture >=100,000 COLONIES/mL ENTEROCOCCUS FAECALIS (A)  Final   Report Status 02/03/2024 FINAL  Final   Organism  ID, Bacteria ENTEROCOCCUS FAECALIS (A)  Final      Susceptibility   Enterococcus faecalis - MIC*    AMPICILLIN <=2 SENSITIVE Sensitive     NITROFURANTOIN <=16 SENSITIVE Sensitive     VANCOMYCIN  2 SENSITIVE Sensitive     * >=100,000 COLONIES/mL ENTEROCOCCUS FAECALIS  Blood culture (routine x 2)     Status: None (Preliminary result)   Collection Time: 02/01/24  3:14 PM   Specimen: BLOOD RIGHT ARM  Result Value Ref Range Status   Specimen Description   Final    BLOOD RIGHT ARM Performed at [facility], [address], [place], [place] [address]    Special Requests   Final    BOTTLES DRAWN AEROBIC AND ANAEROBIC Blood Culture adequate volume Performed at [facility], [address] [place], [place] [address]    Culture   Final    NO GROWTH 2 DAYS Performed at [facility], [address], [place], [place] [address]    Report Status PENDING  Incomplete   Blood culture (routine x 2)     Status: None (Preliminary result)   Collection Time: 02/01/24  6:58 PM   Specimen: BLOOD LEFT ARM  Result Value Ref Range Status   Specimen Description   Final    BLOOD LEFT ARM Performed at [facility], [address] [place], [place] [address]    Special Requests   Final    BOTTLES DRAWN AEROBIC AND ANAEROBIC Blood Culture adequate volume Performed at [facility], [address], [place], [place] [address]    Culture   Final    NO GROWTH 2 DAYS Performed at [facility], [address], [place], [place] [address]    Report Status PENDING  Incomplete     Discharge Instructions:   Discharge Instructions     Diet - low sodium heart healthy   Complete by: As directed    Discharge instructions   Complete by: As directed    Follow-up with your primary care provider in 1 week.  Check blood work at that time.  Complete the course of antibiotic.  Follow-up with Dr. Avonne urology to discuss about stent removal.  Increase fluid intake.  Seek medical attention for worsening symptoms.   Increase activity slowly   Complete by: As directed       Allergies as of 02/03/2024   No Known Allergies      Medication List     PAUSE taking these medications    olmesartan  20 MG tablet Wait to take this until: February 05, 2024 Morning Commonly known as: BENICAR  Take 1 tablet (20 mg total) by mouth daily.   triamterene -hydrochlorothiazide  37.5-25 MG tablet Wait to take this until: February 05, 2024 Evening Commonly known as: MAXZIDE -25 Take 1 tablet by mouth once daily       STOP taking these medications    cephALEXin  500 MG capsule Commonly known as: KEFLEX        TAKE these medications    amoxicillin  500 MG capsule Commonly known as: AMOXIL  Take 1 capsule (500 mg total) by mouth 3 (three) times daily for 7 days.   busPIRone  15 MG tablet Commonly known as: BUSPAR  Take 1 tablet (15 mg total) by mouth 2 (two)  times daily.   celecoxib  200 MG capsule Commonly known as: CELEBREX  Take 1 capsule (200 mg total) by mouth 2 (two) times daily.   DULoxetine  60 MG capsule Commonly known as: CYMBALTA  Take  1 capsule by mouth once daily   hyoscyamine  0.125 MG SL tablet Commonly known as: Levsin /SL Place 1 tablet (0.125 mg total) under the tongue every 4 (four) hours as needed. What changed: reasons to take this   meclizine  25 MG tablet Commonly known as: ANTIVERT  Take 1 tablet (25 mg total) by mouth 3 (three) times daily as needed for dizziness.   metoprolol  succinate 50 MG 24 hr tablet Commonly known as: TOPROL -XL Take 1.5 tablets (75 mg total) by mouth daily. Take with or immediately following a meal.   pantoprazole  40 MG tablet Commonly known as: PROTONIX  Take 1 tablet (40 mg total) by mouth daily. What changed:  when to take this reasons to take this   TAMSULOSIN  HCL PO Take 0.4 mg by mouth at bedtime.   traMADol  50 MG tablet Commonly known as: ULTRAM  Take 1 tablet (50 mg total) by mouth every 6 (six) hours as needed. What changed: reasons to take this   valACYclovir  1000 MG tablet Commonly known as: VALTREX  Take 2 tablets (2,000 mg total) by mouth 2 (two) times daily. What changed:  when to take this reasons to take this        Follow-up Information     Wendee Lynwood HERO, NP Follow up in 1 week(s).   Specialties: Nurse Practitioner, Family Medicine Contact information: [address] [place] [address] [phone]         Lovie Arlyss CROME, MD Follow up in 1 week(s).   Specialty: Urology Contact information: [address] [place] [address] [phone]                  Time coordinating discharge: 39 minutes  Signed:  Donnamaria Shands  [facility] 02/03/2024, 2:49 PM

## 2024-02-04 ENCOUNTER — Telehealth: Payer: Self-pay

## 2024-02-04 NOTE — Transitions of Care (Post Inpatient/ED Visit) (Signed)
   02/04/2024  Name: ETHELEEN VALTIERRA MRN: [id number] DOB: 1976/04/24  Today's TOC FU Call Status: Today's TOC FU Call Status:: Unsuccessful Call (1st Attempt) Unsuccessful Call (1st Attempt) Date: 02/04/24  Attempted to reach the patient regarding the most recent Inpatient/ED visit. Left a HIPAA approved voicemail message to phone number provided in demographics per DPR.    Follow Up Plan: Additional outreach attempts will be made to reach the patient to complete the Transitions of Care (Post Inpatient/ED visit) call.   Richerd Fish, RN, BSN, CCM [facility], [facility] RN Care Manager Direct Dial: [phone]

## 2024-02-06 LAB — CULTURE, BLOOD (ROUTINE X 2)
Culture: NO GROWTH
Culture: NO GROWTH
Special Requests: ADEQUATE
Special Requests: ADEQUATE

## 2024-02-07 ENCOUNTER — Telehealth: Payer: Self-pay

## 2024-02-07 NOTE — Transitions of Care (Post Inpatient/ED Visit) (Signed)
   02/07/2024  Name: MEHREEN AZIZI MRN: [id number] DOB: 05/01/1976  Today's TOC FU Call Status: Today's TOC FU Call Status:: Unsuccessful Call (2nd Attempt) Unsuccessful Call (2nd Attempt) Date: 02/07/24  Attempted to reach the patient regarding the most recent Inpatient/ED visit.  Follow Up Plan: Additional outreach attempts will be made to reach the patient to complete the Transitions of Care (Post Inpatient/ED visit) call.   Alan Ee, RN, BSN, CEN [employer]- Transition of Care Team.  [facility] [phone]

## 2024-02-07 NOTE — Telephone Encounter (Signed)
 Left message to return call to our office.

## 2024-02-08 ENCOUNTER — Telehealth: Payer: Self-pay

## 2024-02-08 NOTE — Transitions of Care (Post Inpatient/ED Visit) (Signed)
   02/08/2024  Name: Vanessa Bean MRN: [id number] DOB: 1975/09/16  Today's TOC FU Call Status: Today's TOC FU Call Status:: Unsuccessful Call (3rd Attempt) Unsuccessful Call (3rd Attempt) Date: 02/08/24  Attempted to reach the patient regarding the most recent Inpatient/ED visit.  Follow Up Plan: No further outreach attempts will be made at this time. We have been unable to contact the patient.  Arvin Seip RN, BSN, CCM [employer], Population Health Case Manager Phone: [phone]

## 2024-02-24 ENCOUNTER — Encounter: Payer: Self-pay | Admitting: Nurse Practitioner

## 2024-02-24 ENCOUNTER — Other Ambulatory Visit: Payer: Self-pay | Admitting: Nurse Practitioner

## 2024-02-24 DIAGNOSIS — R002 Palpitations: Secondary | ICD-10-CM

## 2024-02-24 DIAGNOSIS — I10 Essential (primary) hypertension: Secondary | ICD-10-CM

## 2024-02-24 DIAGNOSIS — R000 Tachycardia, unspecified: Secondary | ICD-10-CM

## 2024-03-18 ENCOUNTER — Other Ambulatory Visit: Payer: Self-pay | Admitting: Nurse Practitioner

## 2024-03-18 DIAGNOSIS — K219 Gastro-esophageal reflux disease without esophagitis: Secondary | ICD-10-CM

## 2024-03-22 ENCOUNTER — Other Ambulatory Visit: Payer: Self-pay | Admitting: Nurse Practitioner

## 2024-03-22 DIAGNOSIS — F320 Major depressive disorder, single episode, mild: Secondary | ICD-10-CM

## 2024-03-23 ENCOUNTER — Other Ambulatory Visit: Payer: Self-pay | Admitting: Nurse Practitioner

## 2024-03-23 DIAGNOSIS — I10 Essential (primary) hypertension: Secondary | ICD-10-CM

## 2024-03-25 ENCOUNTER — Encounter: Payer: Self-pay | Admitting: Nurse Practitioner

## 2024-03-27 ENCOUNTER — Encounter (INDEPENDENT_AMBULATORY_CARE_PROVIDER_SITE_OTHER): Payer: Self-pay

## 2024-03-27 MED ORDER — BUSPIRONE HCL 15 MG PO TABS: 15.0000 mg | ORAL_TABLET | Freq: Three times a day (TID) | ORAL | 1 refills | Status: DC

## 2024-03-27 NOTE — Addendum Note (Signed)
 Addended by: WENDEE LYNWOOD HERO on: 03/27/2024 04:22 PM   Modules accepted: Orders

## 2024-04-07 ENCOUNTER — Ambulatory Visit: Payer: Self-pay | Admitting: Nurse Practitioner

## 2024-04-12 ENCOUNTER — Ambulatory Visit: Payer: Self-pay | Admitting: Nurse Practitioner

## 2024-04-21 ENCOUNTER — Other Ambulatory Visit: Payer: Self-pay | Admitting: Nurse Practitioner

## 2024-05-04 ENCOUNTER — Other Ambulatory Visit: Payer: Self-pay | Admitting: Nurse Practitioner

## 2024-05-21 ENCOUNTER — Other Ambulatory Visit: Payer: Self-pay | Admitting: Family

## 2024-05-21 DIAGNOSIS — I10 Essential (primary) hypertension: Secondary | ICD-10-CM

## 2024-05-21 DIAGNOSIS — R002 Palpitations: Secondary | ICD-10-CM

## 2024-05-21 DIAGNOSIS — R000 Tachycardia, unspecified: Secondary | ICD-10-CM

## 2024-06-13 ENCOUNTER — Encounter (INDEPENDENT_AMBULATORY_CARE_PROVIDER_SITE_OTHER): Payer: Self-pay

## 2024-06-16 ENCOUNTER — Encounter: Payer: Self-pay | Admitting: Nurse Practitioner

## 2024-06-16 ENCOUNTER — Other Ambulatory Visit: Payer: Self-pay | Admitting: Nurse Practitioner

## 2024-06-16 DIAGNOSIS — K219 Gastro-esophageal reflux disease without esophagitis: Secondary | ICD-10-CM

## 2024-06-20 ENCOUNTER — Other Ambulatory Visit: Payer: Self-pay | Admitting: Nurse Practitioner

## 2024-06-20 NOTE — Telephone Encounter (Signed)
 See mychart message below that the patient has not read  How is the heartburn doing on the Protonix  40mg . Do you think you could try to step down to the 20mg  dose?

## 2024-06-21 ENCOUNTER — Telehealth: Payer: Self-pay

## 2024-06-21 MED ORDER — PANTOPRAZOLE SODIUM 20 MG PO TBEC: 20.0000 mg | DELAYED_RELEASE_TABLET | Freq: Every day | ORAL | 2 refills | Status: AC

## 2024-06-21 NOTE — Telephone Encounter (Signed)
 Copied from CRM #[id number]. Topic: General - Other >> Jun 21, 2024 12:06 PM Frederich PARAS wrote: Reason for CRM: pt called back in she says  Pt says her heartburn is doing good on the protonix  40mg , pt also days she can try to step down to the 20mg  whatever pcp thinks is best.   Mitch [phone]

## 2024-06-21 NOTE — Addendum Note (Signed)
 Addended by: WENDEE LYNWOOD HERO on: 06/21/2024 12:48 PM   Modules accepted: Orders

## 2024-06-21 NOTE — Telephone Encounter (Signed)
 Left message to return call to our office.  Please provide  message from provider/office when call is returned from patient.

## 2024-06-26 NOTE — Telephone Encounter (Signed)
 Per chart review patient has called back and this has been addressed.

## 2024-07-03 ENCOUNTER — Ambulatory Visit: Payer: Self-pay | Admitting: Nurse Practitioner

## 2024-07-05 ENCOUNTER — Other Ambulatory Visit: Payer: Self-pay | Admitting: Nurse Practitioner

## 2024-07-18 ENCOUNTER — Encounter: Payer: Self-pay | Admitting: Nurse Practitioner

## 2024-07-18 ENCOUNTER — Ambulatory Visit: Admitting: Nurse Practitioner

## 2024-07-18 VITALS — BP 108/72 | HR 81 | Temp 98.3°F | Ht 64.0 in | Wt 304.0 lb

## 2024-07-18 DIAGNOSIS — H6993 Unspecified Eustachian tube disorder, bilateral: Secondary | ICD-10-CM | POA: Diagnosis not present

## 2024-07-18 DIAGNOSIS — R051 Acute cough: Secondary | ICD-10-CM | POA: Diagnosis not present

## 2024-07-18 DIAGNOSIS — R300 Dysuria: Secondary | ICD-10-CM

## 2024-07-18 DIAGNOSIS — J0140 Acute pansinusitis, unspecified: Secondary | ICD-10-CM

## 2024-07-18 DIAGNOSIS — F32A Depression, unspecified: Secondary | ICD-10-CM

## 2024-07-18 DIAGNOSIS — R6883 Chills (without fever): Secondary | ICD-10-CM

## 2024-07-18 DIAGNOSIS — J020 Streptococcal pharyngitis: Secondary | ICD-10-CM

## 2024-07-18 DIAGNOSIS — E119 Type 2 diabetes mellitus without complications: Secondary | ICD-10-CM

## 2024-07-18 DIAGNOSIS — R8290 Unspecified abnormal findings in urine: Secondary | ICD-10-CM | POA: Diagnosis not present

## 2024-07-18 DIAGNOSIS — R5383 Other fatigue: Secondary | ICD-10-CM

## 2024-07-18 LAB — POC COVID19 BINAXNOW: SARS Coronavirus 2 Ag: NEGATIVE

## 2024-07-18 LAB — VITAMIN D 25 HYDROXY (VIT D DEFICIENCY, FRACTURES): VITD: 16.3 ng/mL — ABNORMAL LOW (ref 30.00–100.00)

## 2024-07-18 LAB — LIPID PANEL
Cholesterol: 208 mg/dL — ABNORMAL HIGH (ref 28–200)
HDL: 39.2 mg/dL
LDL Cholesterol: 132 mg/dL — ABNORMAL HIGH (ref 10–99)
NonHDL: 168.64
Total CHOL/HDL Ratio: 5
Triglycerides: 182 mg/dL — ABNORMAL HIGH (ref 10.0–149.0)
VLDL: 36.4 mg/dL (ref 0.0–40.0)

## 2024-07-18 LAB — POCT URINALYSIS DIPSTICK
Bilirubin, UA: NEGATIVE
Blood, UA: POSITIVE
Glucose, UA: NEGATIVE
Ketones, UA: NEGATIVE
Leukocytes, UA: NEGATIVE
Nitrite, UA: NEGATIVE
Protein, UA: POSITIVE — AB
Spec Grav, UA: 1.025
Urobilinogen, UA: 0.2 U/dL
pH, UA: 6

## 2024-07-18 LAB — COMPREHENSIVE METABOLIC PANEL WITH GFR
ALT: 18 U/L (ref 3–35)
AST: 14 U/L (ref 5–37)
Albumin: 4.2 g/dL (ref 3.5–5.2)
Alkaline Phosphatase: 92 U/L (ref 39–117)
BUN: 14 mg/dL (ref 6–23)
CO2: 29 meq/L (ref 19–32)
Calcium: 9.6 mg/dL (ref 8.4–10.5)
Chloride: 99 meq/L (ref 96–112)
Creatinine, Ser: 0.64 mg/dL (ref 0.40–1.20)
GFR: 105.88 mL/min
Glucose, Bld: 107 mg/dL — ABNORMAL HIGH (ref 70–99)
Potassium: 4.2 meq/L (ref 3.5–5.1)
Sodium: 135 meq/L (ref 135–145)
Total Bilirubin: 0.5 mg/dL (ref 0.2–1.2)
Total Protein: 7.1 g/dL (ref 6.0–8.3)

## 2024-07-18 LAB — CBC
HCT: 40.5 % (ref 36.0–46.0)
Hemoglobin: 13.6 g/dL (ref 12.0–15.0)
MCHC: 33.5 g/dL (ref 30.0–36.0)
MCV: 92.5 fl (ref 78.0–100.0)
Platelets: 402 K/uL — ABNORMAL HIGH (ref 150.0–400.0)
RBC: 4.38 Mil/uL (ref 3.87–5.11)
RDW: 13.7 % (ref 11.5–15.5)
WBC: 8 K/uL (ref 4.0–10.5)

## 2024-07-18 LAB — TSH: TSH: 3.33 u[IU]/mL (ref 0.35–5.50)

## 2024-07-18 LAB — HEMOGLOBIN A1C: Hgb A1c MFr Bld: 6.2 % (ref 4.6–6.5)

## 2024-07-18 LAB — POCT RAPID STREP A (OFFICE): Rapid Strep A Screen: POSITIVE — AB

## 2024-07-18 LAB — VITAMIN B12: Vitamin B-12: 265 pg/mL (ref 211–911)

## 2024-07-18 LAB — POCT INFLUENZA A/B
Influenza A, POC: NEGATIVE
Influenza B, POC: NEGATIVE

## 2024-07-18 MED ORDER — FLUTICASONE PROPIONATE 50 MCG/ACT NA SUSP: 2.0000 | Freq: Every day | NASAL | 0 refills | Status: AC

## 2024-07-18 MED ORDER — AMOXICILLIN-POT CLAVULANATE 875-125 MG PO TABS: 1.0000 | ORAL_TABLET | Freq: Two times a day (BID) | ORAL | 0 refills | Status: AC

## 2024-07-18 NOTE — Progress Notes (Signed)
 "  Established Patient Office Visit  Subjective   Patient ID: Vanessa Bean, female    DOB: 05-13-1976  Age: 49 y.o. MRN: [id number]  Chief Complaint  Patient presents with   Urinary Tract Infection    Pt complains of burning sensation and pain when urinating. Symptoms started about a week ago.    Nasal Congestion    Pt complains of congestion, loss of voice, chills, no body aches, dry cough. Symptoms started about a week ago.       With a history of HTN.GERD. DM2, kidney stone, Obesity  Discussed the use of AI scribe software for clinical note transcription with the patient, who gave verbal consent to proceed.  History of Present Illness Vanessa Bean is a 49 year old female with a history of pyelonephritis and kidney stones who presents with urinary symptoms and respiratory complaints.  She has been experiencing burning during urination for about a week, accompanied by urinary incontinence and right-sided pain that radiates to her back. No visible hematuria, although urinalysis showed blood. She has not used any over-the-counter medications like AZO, Tylenol , or ibuprofen  for these symptoms. She has a history of pyelonephritis and kidney stones.  In addition to urinary symptoms, she has been experiencing respiratory symptoms for about a week. She was exposed to individuals who were coughing at a grocery store. She has not received a flu shot this season and has had the original COVID series but not the boosters. She has been using Mucinex and Coricidin HBP, which have provided some relief. She experiences chills, significant fatigue with sleeping up to eighteen hours a day, sinus pain that improved with Mucinex, ear popping, a mild cough without sputum, shortness of breath both at rest and with activity, and a headache that resolved as sinus pressure decreased.  She is currently taking Buspar , duloxetine  (Cymbalta ), metoprolol , olmesartan , Protonix , and Maxzide  for blood pressure. She  monitors her blood sugar at home, with the highest reading being 200 mg/dL postprandial and the lowest being 90 mg/dL. She reports a net weight loss since her last visit, although she experienced a rebound in weight despite maintaining her diet and exercise routine. She drinks plenty of fluids, including flavored water , green tea, and plain water , and does not consume sugary drinks. She has not been checking her blood sugar as frequently as she should.        Review of Systems  Constitutional:  Positive for chills and malaise/fatigue. Negative for fever.  HENT:  Positive for ear pain (popping) and sinus pain. Negative for sore throat.   Respiratory:  Positive for cough and shortness of breath. Negative for sputum production.   Gastrointestinal:  Positive for abdominal pain, diarrhea and nausea. Negative for vomiting.  Genitourinary:  Positive for flank pain.  Musculoskeletal:  Negative for myalgias.  Neurological:  Positive for headaches.      Objective:     BP 108/72   Pulse 81   Temp 98.3 F (36.8 C) (Oral)   Ht 5' 4 (1.626 m)   Wt (!) 304 lb (137.9 kg)   SpO2 97%   BMI 52.18 kg/m  BP Readings from Last 3 Encounters:  07/18/24 108/72  02/03/24 117/69  01/11/24 137/81   Wt Readings from Last 3 Encounters:  07/18/24 (!) 304 lb (137.9 kg)  02/03/24 (!) 319 lb 10.7 oz (145 kg)  01/11/24 208 lb (94.3 kg)   SpO2 Readings from Last 3 Encounters:  07/18/24 97%  02/03/24 100%  01/11/24 96%  Physical Exam Vitals and nursing note reviewed.  Constitutional:      Appearance: Normal appearance.  HENT:     Right Ear: Tympanic membrane, ear canal and external ear normal.     Left Ear: Tympanic membrane, ear canal and external ear normal.     Nose:     Right Sinus: Maxillary sinus tenderness and frontal sinus tenderness present.     Left Sinus: No maxillary sinus tenderness or frontal sinus tenderness.     Mouth/Throat:     Mouth: Mucous membranes are moist.      Pharynx: Posterior oropharyngeal erythema present.  Cardiovascular:     Rate and Rhythm: Normal rate and regular rhythm.     Heart sounds: Normal heart sounds.  Pulmonary:     Effort: Pulmonary effort is normal.     Breath sounds: Normal breath sounds.  Abdominal:     General: Bowel sounds are normal. There is no distension.     Tenderness: There is no abdominal tenderness. There is right CVA tenderness. There is no left CVA tenderness.  Lymphadenopathy:     Cervical: No cervical adenopathy.  Neurological:     Mental Status: She is alert.      Results for orders placed or performed in visit on 07/18/24  POCT urinalysis dipstick  Result Value Ref Range   Color, UA dark yellow    Clarity, UA clear    Glucose, UA Negative Negative   Bilirubin, UA neg    Ketones, UA neg    Spec Grav, UA 1.025 1.010 - 1.025   Blood, UA pos    pH, UA 6.0 5.0 - 8.0   Protein, UA Positive (A) Negative   Urobilinogen, UA 0.2 0.2 or 1.0 E.U./dL   Nitrite, UA neg    Leukocytes, UA Negative Negative   Appearance     Odor    POC COVID-19 BinaxNow  Result Value Ref Range   SARS Coronavirus 2 Ag Negative Negative  POCT rapid strep A  Result Value Ref Range   Rapid Strep A Screen Positive (A) Negative  POCT Influenza A/B  Result Value Ref Range   Influenza A, POC Negative Negative   Influenza B, POC Negative Negative      The 10-year ASCVD risk score (Arnett DK, et al., 2019) is: 2%    Assessment & Plan:   Problem List Items Addressed This Visit       Endocrine   Diet-controlled type 2 diabetes mellitus (HCC)   Relevant Orders   CBC   Comprehensive metabolic panel with GFR   Hemoglobin A1c   TSH   Lipid panel     Other   Fatigue   Relevant Orders   Vitamin B12   VITAMIN D  25 Hydroxy (Vit-D Deficiency, Fractures)   Other Visit Diagnoses       Dysuria    -  Primary   Relevant Orders   POCT urinalysis dipstick (Completed)     Abnormal urinalysis       Relevant Orders    Urine Culture     Chills       Relevant Orders   POCT Influenza A/B (Completed)     Acute cough       Relevant Orders   POC COVID-19 BinaxNow (Completed)     Dysfunction of both eustachian tubes       Relevant Medications   fluticasone  (FLONASE ) 50 MCG/ACT nasal spray     Strep throat       Relevant Medications  amoxicillin -clavulanate (AUGMENTIN ) 875-125 MG tablet   Other Relevant Orders   POCT rapid strep A (Completed)     Acute non-recurrent pansinusitis       Relevant Medications   amoxicillin -clavulanate (AUGMENTIN ) 875-125 MG tablet   fluticasone  (FLONASE ) 50 MCG/ACT nasal spray      Assessment and Plan Assessment & Plan Acute urinary tract infection versus nephrolithiasis Differential includes UTI and nephrolithiasis. Urinalysis showed hematuria without pyuria. Previous pyelonephritis and nephrolithiasis complicate diagnosis. Culture sent to confirm infection. - Prescribed Augmentin  (amoxicillin /clavulanate) twice daily for 7 days. - Sent urine culture to lab. - Advised increased water  intake. - If symptoms persist and culture is negative, will consider nephrolithiasis and further investigation.  Acute streptococcal pharyngitis and acute sinusitis Positive strep test. Broad-spectrum antibiotic chosen to cover both conditions. - Prescribed Augmentin  (amoxicillin /clavulanate) twice daily for 7 days. - Prescribed Flonase  nasal spray for sinus symptoms. - Advised continued use of Mucinex if helpful.  Diet-controlled type 2 diabetes mellitus Weight decreased from 295 lbs in September. Blood glucose levels range from 90 to 200 mg/dL. - Ordered A1c test with current labs. - Continue current diet and fluid intake.  Primary hypertension Blood pressure management ongoing with current medications. - Continue current antihypertensive medications.  Depression Reports fatigue, irritability, and excessive sleep. Possible recurrence of depression. Labs to assess for  contributing factors. - Ordered comprehensive lab panel including B12, vitamin D , thyroid  function, and complete blood count.  General health maintenance Has not received flu shot or COVID booster this season. Recent insurance change noted. - Discussed COVID booster options.   Return for Determined by results.    Adina Crandall, NP  "

## 2024-07-18 NOTE — Patient Instructions (Addendum)
 Nice to see you today  I will be in touch with the labs once we have it Follow up will be based on labs I have sent in the antibiotics and nasal spray to the pharmacy  Flu and covid test were negative

## 2024-07-19 LAB — URINE CULTURE
MICRO NUMBER:: 17490934
SPECIMEN QUALITY:: ADEQUATE

## 2024-07-21 ENCOUNTER — Ambulatory Visit: Payer: Self-pay | Admitting: Nurse Practitioner

## 2024-07-21 DIAGNOSIS — E559 Vitamin D deficiency, unspecified: Secondary | ICD-10-CM

## 2024-07-21 MED ORDER — VITAMIN D (ERGOCALCIFEROL) 1.25 MG (50000 UNIT) PO CAPS: 50000.0000 [IU] | ORAL_CAPSULE | ORAL | 0 refills | Status: AC

## 2024-07-24 NOTE — Telephone Encounter (Signed)
 Needs a 3 month follow up with me  I have pended the cholesterol medication can we see which [facility] she wants and then send it in please

## 2024-07-24 NOTE — Telephone Encounter (Signed)
 Left message to return call to our office.

## 2024-07-26 NOTE — Telephone Encounter (Signed)
"  Left voicemail for patient to call the office back.     "

## 2024-07-27 MED ORDER — ATORVASTATIN CALCIUM 10 MG PO TABS: 10.0000 mg | ORAL_TABLET | Freq: Every day | ORAL | 0 refills | Status: AC

## 2024-07-27 NOTE — Telephone Encounter (Signed)
-----   Message from [facility] T sent at 07/26/2024 12:16 PM EST -----  ----- Message ----- From: Albino Manuelita BROCKS, CMA Sent: 07/26/2024  10:53 AM EST To: Wendee Gunnels   ----- Message ----- From: Butler Burnard HERO Sent: 07/26/2024  10:51 AM EST To: [facility]  ----- Message from Burnard HERO Butler sent at 07/26/2024 10:51 AM EST -----

## 2024-07-27 NOTE — Telephone Encounter (Signed)
 Did it improve at all with the previous treatment?

## 2024-08-08 ENCOUNTER — Ambulatory Visit: Admitting: Nurse Practitioner

## 2024-10-24 ENCOUNTER — Ambulatory Visit: Admitting: Nurse Practitioner
